# Patient Record
Sex: Female | Born: 1945 | Race: Black or African American | Hispanic: No | Marital: Single | Smoking: Former smoker
Health system: Southern US, Community
[De-identification: ages and names within clinical notes are randomized; demographics above are authoritative.]

## PROBLEM LIST (undated history)

## (undated) DIAGNOSIS — R233 Spontaneous ecchymoses: Secondary | ICD-10-CM

## (undated) DIAGNOSIS — K5090 Crohn's disease, unspecified, without complications: Secondary | ICD-10-CM

## (undated) DIAGNOSIS — IMO0002 Reserved for concepts with insufficient information to code with codable children: Secondary | ICD-10-CM

## (undated) DIAGNOSIS — M7989 Other specified soft tissue disorders: Secondary | ICD-10-CM

## (undated) DIAGNOSIS — R198 Other specified symptoms and signs involving the digestive system and abdomen: Secondary | ICD-10-CM

## (undated) DIAGNOSIS — K631 Perforation of intestine (nontraumatic): Secondary | ICD-10-CM

## (undated) DIAGNOSIS — R55 Syncope and collapse: Secondary | ICD-10-CM

## (undated) DIAGNOSIS — D649 Anemia, unspecified: Secondary | ICD-10-CM

## (undated) DIAGNOSIS — K921 Melena: Secondary | ICD-10-CM

## (undated) DIAGNOSIS — R531 Weakness: Secondary | ICD-10-CM

## (undated) DIAGNOSIS — I10 Essential (primary) hypertension: Secondary | ICD-10-CM

## (undated) DIAGNOSIS — R51 Headache: Secondary | ICD-10-CM

## (undated) DIAGNOSIS — R197 Diarrhea, unspecified: Secondary | ICD-10-CM

## (undated) DIAGNOSIS — K5900 Constipation, unspecified: Secondary | ICD-10-CM

## (undated) DIAGNOSIS — K625 Hemorrhage of anus and rectum: Secondary | ICD-10-CM

## (undated) DIAGNOSIS — R002 Palpitations: Secondary | ICD-10-CM

## (undated) DIAGNOSIS — R519 Headache, unspecified: Secondary | ICD-10-CM

## (undated) DIAGNOSIS — Z5189 Encounter for other specified aftercare: Secondary | ICD-10-CM

## (undated) DIAGNOSIS — M810 Age-related osteoporosis without current pathological fracture: Secondary | ICD-10-CM

## (undated) DIAGNOSIS — J029 Acute pharyngitis, unspecified: Secondary | ICD-10-CM

## (undated) DIAGNOSIS — R112 Nausea with vomiting, unspecified: Secondary | ICD-10-CM

## (undated) DIAGNOSIS — R21 Rash and other nonspecific skin eruption: Secondary | ICD-10-CM

## (undated) DIAGNOSIS — R062 Wheezing: Secondary | ICD-10-CM

## (undated) DIAGNOSIS — R509 Fever, unspecified: Secondary | ICD-10-CM

## (undated) DIAGNOSIS — R109 Unspecified abdominal pain: Secondary | ICD-10-CM

## (undated) DIAGNOSIS — R0981 Nasal congestion: Secondary | ICD-10-CM

## (undated) DIAGNOSIS — K632 Fistula of intestine: Secondary | ICD-10-CM

## (undated) DIAGNOSIS — R05 Cough: Secondary | ICD-10-CM

## (undated) DIAGNOSIS — R6883 Chills (without fever): Secondary | ICD-10-CM

## (undated) DIAGNOSIS — R599 Enlarged lymph nodes, unspecified: Secondary | ICD-10-CM

## (undated) DIAGNOSIS — E785 Hyperlipidemia, unspecified: Secondary | ICD-10-CM

## (undated) DIAGNOSIS — R238 Other skin changes: Secondary | ICD-10-CM

## (undated) DIAGNOSIS — R059 Cough, unspecified: Secondary | ICD-10-CM

## (undated) DIAGNOSIS — R140 Abdominal distension (gaseous): Secondary | ICD-10-CM

## (undated) HISTORY — DX: Encounter for other specified aftercare: Z51.89

## (undated) HISTORY — DX: Nausea with vomiting, unspecified: R11.2

## (undated) HISTORY — DX: Age-related osteoporosis without current pathological fracture: M81.0

## (undated) HISTORY — DX: Hemorrhage of anus and rectum: K62.5

## (undated) HISTORY — DX: Diarrhea, unspecified: R19.7

## (undated) HISTORY — DX: Cough, unspecified: R05.9

## (undated) HISTORY — DX: Nasal congestion: R09.81

## (undated) HISTORY — DX: Palpitations: R00.2

## (undated) HISTORY — DX: Abdominal distension (gaseous): R14.0

## (undated) HISTORY — DX: Melena: K92.1

## (undated) HISTORY — DX: Chills (without fever): R68.83

## (undated) HISTORY — DX: Syncope and collapse: R55

## (undated) HISTORY — DX: Enlarged lymph nodes, unspecified: R59.9

## (undated) HISTORY — DX: Other skin changes: R23.8

## (undated) HISTORY — DX: Anemia, unspecified: D64.9

## (undated) HISTORY — DX: Wheezing: R06.2

## (undated) HISTORY — DX: Headache, unspecified: R51.9

## (undated) HISTORY — DX: Other specified symptoms and signs involving the digestive system and abdomen: R19.8

## (undated) HISTORY — DX: Headache: R51

## (undated) HISTORY — DX: Spontaneous ecchymoses: R23.3

## (undated) HISTORY — DX: Fistula of intestine: K63.2

## (undated) HISTORY — DX: Rash and other nonspecific skin eruption: R21

## (undated) HISTORY — DX: Constipation, unspecified: K59.00

## (undated) HISTORY — DX: Weakness: R53.1

## (undated) HISTORY — DX: Fever, unspecified: R50.9

## (undated) HISTORY — DX: Unspecified abdominal pain: R10.9

## (undated) HISTORY — DX: Cough: R05

## (undated) HISTORY — PX: OTHER SURGICAL HISTORY: SHX169

## (undated) HISTORY — DX: Acute pharyngitis, unspecified: J02.9

---

## 1998-12-03 ENCOUNTER — Ambulatory Visit (HOSPITAL_COMMUNITY): Admission: RE | Admit: 1998-12-03 | Discharge: 1998-12-03 | Payer: Self-pay | Admitting: Family Medicine

## 2000-12-30 ENCOUNTER — Ambulatory Visit (HOSPITAL_COMMUNITY): Admission: RE | Admit: 2000-12-30 | Discharge: 2000-12-30 | Payer: Self-pay | Admitting: Emergency Medicine

## 2001-01-31 ENCOUNTER — Encounter: Admission: RE | Admit: 2001-01-31 | Discharge: 2001-01-31 | Payer: Self-pay | Admitting: Gastroenterology

## 2001-01-31 ENCOUNTER — Encounter: Payer: Self-pay | Admitting: Gastroenterology

## 2001-03-01 ENCOUNTER — Encounter: Admission: RE | Admit: 2001-03-01 | Discharge: 2001-03-01 | Payer: Self-pay | Admitting: Internal Medicine

## 2001-03-01 ENCOUNTER — Encounter: Payer: Self-pay | Admitting: Internal Medicine

## 2001-03-08 ENCOUNTER — Encounter: Payer: Self-pay | Admitting: Internal Medicine

## 2001-03-08 ENCOUNTER — Encounter: Admission: RE | Admit: 2001-03-08 | Discharge: 2001-03-08 | Payer: Self-pay | Admitting: Internal Medicine

## 2001-03-11 ENCOUNTER — Ambulatory Visit (HOSPITAL_COMMUNITY): Admission: RE | Admit: 2001-03-11 | Discharge: 2001-03-11 | Payer: Self-pay | Admitting: Gastroenterology

## 2001-03-11 ENCOUNTER — Encounter (INDEPENDENT_AMBULATORY_CARE_PROVIDER_SITE_OTHER): Payer: Self-pay | Admitting: *Deleted

## 2001-03-16 ENCOUNTER — Encounter: Admission: RE | Admit: 2001-03-16 | Discharge: 2001-05-03 | Payer: Self-pay | Admitting: Internal Medicine

## 2001-04-06 ENCOUNTER — Encounter: Payer: Self-pay | Admitting: Gastroenterology

## 2001-04-06 ENCOUNTER — Encounter: Admission: RE | Admit: 2001-04-06 | Discharge: 2001-04-06 | Payer: Self-pay | Admitting: Gastroenterology

## 2002-05-29 ENCOUNTER — Encounter: Payer: Self-pay | Admitting: Internal Medicine

## 2002-05-29 ENCOUNTER — Encounter: Admission: RE | Admit: 2002-05-29 | Discharge: 2002-05-29 | Payer: Self-pay | Admitting: Internal Medicine

## 2003-03-26 ENCOUNTER — Encounter: Admission: RE | Admit: 2003-03-26 | Discharge: 2003-03-26 | Payer: Self-pay | Admitting: Internal Medicine

## 2003-07-02 ENCOUNTER — Encounter: Admission: RE | Admit: 2003-07-02 | Discharge: 2003-07-02 | Payer: Self-pay | Admitting: Internal Medicine

## 2003-07-06 ENCOUNTER — Encounter: Admission: RE | Admit: 2003-07-06 | Discharge: 2003-07-06 | Payer: Self-pay | Admitting: Internal Medicine

## 2003-07-19 ENCOUNTER — Encounter: Admission: RE | Admit: 2003-07-19 | Discharge: 2003-07-19 | Payer: Self-pay | Admitting: Gastroenterology

## 2003-10-08 ENCOUNTER — Encounter: Admission: RE | Admit: 2003-10-08 | Discharge: 2003-10-08 | Payer: Self-pay | Admitting: Internal Medicine

## 2004-01-03 ENCOUNTER — Encounter (INDEPENDENT_AMBULATORY_CARE_PROVIDER_SITE_OTHER): Payer: Self-pay | Admitting: *Deleted

## 2004-01-03 ENCOUNTER — Ambulatory Visit (HOSPITAL_COMMUNITY): Admission: RE | Admit: 2004-01-03 | Discharge: 2004-01-03 | Payer: Self-pay | Admitting: *Deleted

## 2004-02-18 ENCOUNTER — Encounter: Admission: RE | Admit: 2004-02-18 | Discharge: 2004-02-18 | Payer: Self-pay | Admitting: Obstetrics and Gynecology

## 2004-03-17 ENCOUNTER — Encounter: Admission: RE | Admit: 2004-03-17 | Discharge: 2004-03-17 | Payer: Self-pay | Admitting: General Surgery

## 2005-01-07 ENCOUNTER — Encounter: Admission: RE | Admit: 2005-01-07 | Discharge: 2005-01-07 | Payer: Self-pay | Admitting: Internal Medicine

## 2005-01-19 ENCOUNTER — Encounter: Admission: RE | Admit: 2005-01-19 | Discharge: 2005-01-19 | Payer: Self-pay | Admitting: Internal Medicine

## 2005-09-16 ENCOUNTER — Encounter: Admission: RE | Admit: 2005-09-16 | Discharge: 2005-09-16 | Payer: Self-pay | Admitting: Gastroenterology

## 2005-09-28 ENCOUNTER — Ambulatory Visit: Payer: Self-pay | Admitting: Hematology & Oncology

## 2005-10-14 LAB — CBC & DIFF AND RETIC
Basophils Absolute: 0 10*3/uL (ref 0.0–0.1)
EOS%: 0.1 % (ref 0.0–7.0)
MCH: 21.5 pg — ABNORMAL LOW (ref 26.0–34.0)
MCV: 70.3 fL — ABNORMAL LOW (ref 81.0–101.0)
MONO%: 1.6 % (ref 0.0–13.0)
RBC: 4.71 10*6/uL (ref 3.70–5.32)
RDW: 17.7 % — ABNORMAL HIGH (ref 11.3–14.5)
RETIC #: 109.7 10*3/uL (ref 19.7–115.1)
Retic %: 2.3 % (ref 0.4–2.3)

## 2005-10-17 LAB — TRANSFERRIN RECEPTOR, SOLUABLE: Transferrin Receptor, Soluble: 7.9 mg/L — ABNORMAL HIGH (ref 1.9–4.4)

## 2005-11-23 ENCOUNTER — Ambulatory Visit: Payer: Self-pay | Admitting: Hematology & Oncology

## 2005-11-25 LAB — CBC & DIFF AND RETIC
BASO%: 0 % (ref 0.0–2.0)
EOS%: 0.2 % (ref 0.0–7.0)
Eosinophils Absolute: 0 10*3/uL (ref 0.0–0.5)
LYMPH%: 7.8 % — ABNORMAL LOW (ref 14.0–48.0)
MCH: 25.2 pg — ABNORMAL LOW (ref 26.0–34.0)
MCHC: 32.8 g/dL (ref 32.0–36.0)
MCV: 76.8 fL — ABNORMAL LOW (ref 81.0–101.0)
MONO%: 6.1 % (ref 0.0–13.0)
Platelets: 286 10*3/uL (ref 145–400)
RBC: 4.91 10*6/uL (ref 3.70–5.32)
RDW: 28 % — ABNORMAL HIGH (ref 11.3–14.5)
RETIC #: 70.7 10*3/uL (ref 19.7–115.1)
Retic %: 1.4 % (ref 0.4–2.3)

## 2005-11-28 LAB — TRANSFERRIN RECEPTOR, SOLUABLE: Transferrin Receptor, Soluble: 4.1 mg/L (ref 1.9–4.4)

## 2006-01-27 ENCOUNTER — Encounter: Admission: RE | Admit: 2006-01-27 | Discharge: 2006-01-27 | Payer: Self-pay | Admitting: Internal Medicine

## 2006-02-22 ENCOUNTER — Ambulatory Visit: Payer: Self-pay | Admitting: Hematology & Oncology

## 2006-03-17 LAB — CBC WITH DIFFERENTIAL/PLATELET
BASO%: 0.2 % (ref 0.0–2.0)
Basophils Absolute: 0 10*3/uL (ref 0.0–0.1)
EOS%: 0 % (ref 0.0–7.0)
Eosinophils Absolute: 0 10*3/uL (ref 0.0–0.5)
HCT: 35.4 % (ref 34.8–46.6)
HGB: 11.9 g/dL (ref 11.6–15.9)
LYMPH%: 7.8 % — ABNORMAL LOW (ref 14.0–48.0)
MCH: 28.2 pg (ref 26.0–34.0)
MCHC: 33.6 g/dL (ref 32.0–36.0)
MCV: 83.7 fL (ref 81.0–101.0)
MONO#: 0.5 10*3/uL (ref 0.1–0.9)
MONO%: 8.3 % (ref 0.0–13.0)
NEUT#: 4.8 10*3/uL (ref 1.5–6.5)
NEUT%: 83.7 % — ABNORMAL HIGH (ref 39.6–76.8)
Platelets: 370 10*3/uL (ref 145–400)
RBC: 4.23 10*6/uL (ref 3.70–5.32)
RDW: 14.5 % (ref 11.3–14.5)
WBC: 5.8 10*3/uL (ref 3.9–10.0)
lymph#: 0.5 10*3/uL — ABNORMAL LOW (ref 0.9–3.3)

## 2006-03-19 LAB — FERRITIN: Ferritin: 125 ng/mL (ref 10–291)

## 2006-05-07 ENCOUNTER — Ambulatory Visit: Payer: Self-pay | Admitting: Hematology & Oncology

## 2006-05-12 LAB — CBC & DIFF AND RETIC
Basophils Absolute: 0 10*3/uL (ref 0.0–0.1)
Eosinophils Absolute: 0.1 10*3/uL (ref 0.0–0.5)
HGB: 12.3 g/dL (ref 11.6–15.9)
IRF: 0.28 (ref 0.130–0.330)
MCV: 82.7 fL (ref 81.0–101.0)
MONO#: 0.5 10*3/uL (ref 0.1–0.9)
MONO%: 10.2 % (ref 0.0–13.0)
NEUT#: 3.6 10*3/uL (ref 1.5–6.5)
RDW: 15.6 % — ABNORMAL HIGH (ref 11.3–14.5)
RETIC #: 56.6 10*3/uL (ref 19.7–115.1)

## 2006-05-14 LAB — FERRITIN: Ferritin: 44 ng/mL (ref 10–291)

## 2006-09-10 ENCOUNTER — Ambulatory Visit: Payer: Self-pay | Admitting: Hematology & Oncology

## 2007-02-02 ENCOUNTER — Encounter: Admission: RE | Admit: 2007-02-02 | Discharge: 2007-02-02 | Payer: Self-pay | Admitting: Internal Medicine

## 2008-07-02 ENCOUNTER — Encounter: Admission: RE | Admit: 2008-07-02 | Discharge: 2008-07-02 | Payer: Self-pay | Admitting: Internal Medicine

## 2008-07-09 ENCOUNTER — Encounter: Admission: RE | Admit: 2008-07-09 | Discharge: 2008-07-09 | Payer: Self-pay | Admitting: Internal Medicine

## 2008-10-29 ENCOUNTER — Encounter: Admission: RE | Admit: 2008-10-29 | Discharge: 2008-10-29 | Payer: Self-pay | Admitting: Gastroenterology

## 2010-01-28 ENCOUNTER — Encounter: Admission: RE | Admit: 2010-01-28 | Discharge: 2010-01-28 | Payer: Self-pay | Admitting: Internal Medicine

## 2010-09-05 NOTE — Procedures (Signed)
[place]  Patient:    Michele David, Michele David Visit Number: [id number] MRN: [id number]          Service Type: END Location: ENDO Attending Physician:  Charna Elizabeth Dictated by:   Anselmo Rod, M.D. Proc. Date: 03/11/01 Admit Date:  03/11/2001 Discharge Date: 03/11/2001   CC:         Dr. Trixie Deis. Myna Hidalgo, M.D.   Procedure Report  DATE OF BIRTH:  10-Nov-1945  PROCEDURE PERFORMED:  Colonoscopy with biopsy.  ENDOSCOPIST:  Anselmo Rod, M.D.  INSTRUMENT USED:  Olympus video colonoscope.  INDICATIONS FOR PROCEDURE:  Rectal bleeding, history of Crohns disease, and anemia in a 65 year old African-American female.  Colonoscopy is being done to evaluate extensive disease.  The patient had a recent history of severe diarrhea with blood in stool.  Recent radiological studies showed involvement of the small bowel with Crohns disease as well.  PREPROCEDURE PREPARATION:  Informed consent was procured from the patient. The patient was fasted for eight hours prior to the procedure, and prepped with a bottle of magnesium citrate, and a gallon of NuLytely the night prior to the procedure.  PREPROCEDURE PHYSICAL:  VITAL SIGNS:  Stable.  NECK:  Supple.  CHEST:  Clear to auscultation, S1 and S2 regular.  ABDOMEN:  Soft with normal bowel sounds.  DESCRIPTION OF PROCEDURE:  The patient was placed in the left lateral decubitus position, and sedated with 50 mg of Demerol and 5 mg of Versed intravenously.  Once the patient was adequately sedated, maintained on low flow oxygen and continuous cardiac monitoring, the Olympus video colonoscope was advanced from the rectum to the cecum without difficulty.  The patient had patchy ulceration in the right colon.  There was also some sort of stricturing seen on the right side as well.  However, the scope was negotiated through this site without any problems.  Small internal hemorrhoids  were appreciated on retroflexion in the rectum.  The mucosa on the left colon and transverse colon appeared relatively healthy.  IMPRESSION: 1. Patchy ulceration in the right colon. 2. Healthy-appearing transverse colon, left colon. 3. Small internal hemorrhoids.  RECOMMENDATIONS: 1. Await pathology results. 2. Start mesalamine as before. 3. Outpatient follow up in the next 10 days. Dictated by:   Anselmo Rod, M.D. Attending Physician:  Charna Elizabeth DD:  03/11/01 TD:  03/14/01 Job: [id number]

## 2011-02-10 ENCOUNTER — Other Ambulatory Visit: Payer: Self-pay | Admitting: Internal Medicine

## 2011-02-10 DIAGNOSIS — Z1231 Encounter for screening mammogram for malignant neoplasm of breast: Secondary | ICD-10-CM

## 2011-02-13 ENCOUNTER — Ambulatory Visit
Admission: RE | Admit: 2011-02-13 | Discharge: 2011-02-13 | Disposition: A | Discharge status: Home / Self Care | Payer: Medicare Other | Source: Ambulatory Visit | Attending: Internal Medicine | Admitting: Internal Medicine

## 2011-02-13 DIAGNOSIS — Z1231 Encounter for screening mammogram for malignant neoplasm of breast: Secondary | ICD-10-CM

## 2011-06-05 ENCOUNTER — Ambulatory Visit
Admission: RE | Admit: 2011-06-05 | Discharge: 2011-06-05 | Disposition: A | Discharge status: Home / Self Care | Payer: Medicare Other | Source: Ambulatory Visit | Attending: Internal Medicine | Admitting: Internal Medicine

## 2011-06-05 ENCOUNTER — Other Ambulatory Visit: Payer: Self-pay | Admitting: Internal Medicine

## 2011-06-05 DIAGNOSIS — R0602 Shortness of breath: Secondary | ICD-10-CM

## 2011-11-12 ENCOUNTER — Other Ambulatory Visit (HOSPITAL_COMMUNITY): Payer: Self-pay | Admitting: Internal Medicine

## 2011-11-16 ENCOUNTER — Other Ambulatory Visit (HOSPITAL_COMMUNITY): Payer: Self-pay | Admitting: Internal Medicine

## 2011-11-16 DIAGNOSIS — R0602 Shortness of breath: Secondary | ICD-10-CM

## 2011-11-19 ENCOUNTER — Other Ambulatory Visit: Payer: Self-pay

## 2011-11-20 ENCOUNTER — Encounter (HOSPITAL_COMMUNITY): Payer: Medicare Other

## 2011-11-25 ENCOUNTER — Ambulatory Visit (HOSPITAL_COMMUNITY)
Admission: RE | Admit: 2011-11-25 | Discharge: 2011-11-25 | Disposition: A | Discharge status: Home / Self Care | Payer: Medicare Other | Source: Ambulatory Visit | Attending: Internal Medicine | Admitting: Internal Medicine

## 2011-11-25 DIAGNOSIS — R0602 Shortness of breath: Secondary | ICD-10-CM | POA: Insufficient documentation

## 2011-11-25 MED ORDER — ALBUTEROL SULFATE (5 MG/ML) 0.5% IN NEBU
2.5000 mg | INHALATION_SOLUTION | Freq: Once | RESPIRATORY_TRACT | Status: AC
  Administered 2011-11-25: 2.5 mg via RESPIRATORY_TRACT

## 2012-01-23 ENCOUNTER — Encounter (HOSPITAL_COMMUNITY): Admission: EM | Disposition: A | Discharge status: Skilled Nursing Facility | Payer: Self-pay | Source: Home / Self Care

## 2012-01-23 ENCOUNTER — Encounter (HOSPITAL_COMMUNITY): Payer: Self-pay | Admitting: Surgery

## 2012-01-23 ENCOUNTER — Ambulatory Visit (INDEPENDENT_AMBULATORY_CARE_PROVIDER_SITE_OTHER): Payer: Medicare Other | Admitting: Family Medicine

## 2012-01-23 ENCOUNTER — Encounter (HOSPITAL_COMMUNITY): Payer: Self-pay | Admitting: Anesthesiology

## 2012-01-23 ENCOUNTER — Inpatient Hospital Stay (HOSPITAL_COMMUNITY)
Admission: EM | Admit: 2012-01-23 | Discharge: 2012-02-03 | DRG: 329 | Disposition: A | Discharge status: Skilled Nursing Facility | Payer: Medicare Other | Attending: Surgery | Admitting: Surgery

## 2012-01-23 ENCOUNTER — Emergency Department (HOSPITAL_COMMUNITY): Payer: Medicare Other

## 2012-01-23 ENCOUNTER — Inpatient Hospital Stay (HOSPITAL_COMMUNITY): Payer: Medicare Other | Admitting: Anesthesiology

## 2012-01-23 VITALS — BP 165/80 | HR 94 | Temp 99.4°F | Resp 18 | Ht 62.0 in | Wt 159.9 lb

## 2012-01-23 DIAGNOSIS — K660 Peritoneal adhesions (postprocedural) (postinfection): Secondary | ICD-10-CM | POA: Diagnosis present

## 2012-01-23 DIAGNOSIS — K631 Perforation of intestine (nontraumatic): Secondary | ICD-10-CM | POA: Diagnosis present

## 2012-01-23 DIAGNOSIS — K5000 Crohn's disease of small intestine without complications: Principal | ICD-10-CM | POA: Diagnosis present

## 2012-01-23 DIAGNOSIS — R1011 Right upper quadrant pain: Secondary | ICD-10-CM

## 2012-01-23 DIAGNOSIS — R5381 Other malaise: Secondary | ICD-10-CM | POA: Diagnosis present

## 2012-01-23 DIAGNOSIS — E8779 Other fluid overload: Secondary | ICD-10-CM | POA: Diagnosis not present

## 2012-01-23 DIAGNOSIS — K50019 Crohn's disease of small intestine with unspecified complications: Secondary | ICD-10-CM | POA: Diagnosis present

## 2012-01-23 DIAGNOSIS — K560 Paralytic ileus: Secondary | ICD-10-CM | POA: Diagnosis not present

## 2012-01-23 DIAGNOSIS — I10 Essential (primary) hypertension: Secondary | ICD-10-CM | POA: Diagnosis present

## 2012-01-23 DIAGNOSIS — K5090 Crohn's disease, unspecified, without complications: Secondary | ICD-10-CM

## 2012-01-23 DIAGNOSIS — R198 Other specified symptoms and signs involving the digestive system and abdomen: Secondary | ICD-10-CM | POA: Diagnosis present

## 2012-01-23 DIAGNOSIS — R197 Diarrhea, unspecified: Secondary | ICD-10-CM | POA: Diagnosis not present

## 2012-01-23 DIAGNOSIS — R109 Unspecified abdominal pain: Secondary | ICD-10-CM

## 2012-01-23 DIAGNOSIS — E876 Hypokalemia: Secondary | ICD-10-CM | POA: Diagnosis not present

## 2012-01-23 DIAGNOSIS — K658 Other peritonitis: Secondary | ICD-10-CM | POA: Diagnosis present

## 2012-01-23 DIAGNOSIS — I890 Lymphedema, not elsewhere classified: Secondary | ICD-10-CM | POA: Diagnosis present

## 2012-01-23 HISTORY — DX: Other specified symptoms and signs involving the digestive system and abdomen: R19.8

## 2012-01-23 HISTORY — PX: LAPAROTOMY: SHX154

## 2012-01-23 LAB — CBC WITH DIFFERENTIAL/PLATELET
Basophils Relative: 0 % (ref 0–1)
Eosinophils Absolute: 0 10*3/uL (ref 0.0–0.7)
HCT: 37.7 % (ref 36.0–46.0)
Hemoglobin: 13.1 g/dL (ref 12.0–15.0)
Lymphs Abs: 0.2 10*3/uL — ABNORMAL LOW (ref 0.7–4.0)
MCH: 29.2 pg (ref 26.0–34.0)
MCHC: 34.7 g/dL (ref 30.0–36.0)
Monocytes Absolute: 0.5 10*3/uL (ref 0.1–1.0)
Monocytes Relative: 5 % (ref 3–12)
Neutro Abs: 8.8 10*3/uL — ABNORMAL HIGH (ref 1.7–7.7)
RBC: 4.49 MIL/uL (ref 3.87–5.11)

## 2012-01-23 LAB — POCT CBC
Granulocyte percent: 91.7 %G — AB (ref 37–80)
HCT, POC: 46.3 % (ref 37.7–47.9)
Hemoglobin: 14.8 g/dL (ref 12.2–16.2)
Lymph, poc: 0.5 — AB (ref 0.6–3.4)
MCH, POC: 28.9 pg (ref 27–31.2)
MCHC: 32 g/dL (ref 31.8–35.4)
MCV: 90.5 fL (ref 80–97)
MID (cbc): 0.5 (ref 0–0.9)
MPV: 9.3 fL (ref 0–99.8)
POC Granulocyte: 11.3 — AB (ref 2–6.9)
POC LYMPH PERCENT: 4.2 %L — AB (ref 10–50)
POC MID %: 4.1 %M (ref 0–12)
Platelet Count, POC: 257 10*3/uL (ref 142–424)
RBC: 5.12 M/uL (ref 4.04–5.48)
RDW, POC: 14.9 %
WBC: 12.3 10*3/uL — AB (ref 4.6–10.2)

## 2012-01-23 LAB — POCT UA - MICROSCOPIC ONLY
Casts, Ur, LPF, POC: NEGATIVE
Crystals, Ur, HPF, POC: NEGATIVE
Yeast, UA: NEGATIVE

## 2012-01-23 LAB — POCT URINALYSIS DIPSTICK
Glucose, UA: NEGATIVE
Ketones, UA: NEGATIVE
Leukocytes, UA: NEGATIVE
Nitrite, UA: NEGATIVE
Spec Grav, UA: >=1.03
Urobilinogen, UA: 0.2
pH, UA: 5

## 2012-01-23 LAB — COMPREHENSIVE METABOLIC PANEL
Albumin: 3.1 g/dL — ABNORMAL LOW (ref 3.5–5.2)
Alkaline Phosphatase: 85 U/L (ref 39–117)
BUN: 22 mg/dL (ref 6–23)
Chloride: 106 mEq/L (ref 96–112)
Creatinine, Ser: 0.81 mg/dL (ref 0.50–1.10)
GFR calc Af Amer: 85 mL/min — ABNORMAL LOW (ref >90)
Glucose, Bld: 157 mg/dL — ABNORMAL HIGH (ref 70–99)
Total Bilirubin: 0.6 mg/dL (ref 0.3–1.2)

## 2012-01-23 LAB — LIPASE, BLOOD: Lipase: 8 U/L — ABNORMAL LOW (ref 11–59)

## 2012-01-23 SURGERY — LAPAROTOMY, EXPLORATORY
Anesthesia: General | Site: Abdomen | Wound class: Dirty or Infected

## 2012-01-23 MED ORDER — ONDANSETRON HCL 4 MG/2ML IJ SOLN
4.0000 mg | Freq: Once | INTRAMUSCULAR | Status: AC
  Administered 2012-01-23: 4 mg via INTRAVENOUS
  Filled 2012-01-23: qty 2

## 2012-01-23 MED ORDER — SODIUM CHLORIDE 0.9 % IV BOLUS (SEPSIS)
500.0000 mL | Freq: Once | INTRAVENOUS | Status: AC
  Administered 2012-01-23: 500 mL via INTRAVENOUS

## 2012-01-23 MED ORDER — IOHEXOL 300 MG/ML  SOLN
100.0000 mL | Freq: Once | INTRAMUSCULAR | Status: AC | PRN
  Administered 2012-01-23: 100 mL via INTRAVENOUS

## 2012-01-23 MED ORDER — ONDANSETRON HCL 4 MG/2ML IJ SOLN
INTRAMUSCULAR | Status: DC | PRN
  Administered 2012-01-23: 4 mg via INTRAVENOUS

## 2012-01-23 MED ORDER — PROPOFOL 10 MG/ML IV BOLUS
INTRAVENOUS | Status: DC | PRN
  Administered 2012-01-23: 160 mg via INTRAVENOUS

## 2012-01-23 MED ORDER — NEOSTIGMINE METHYLSULFATE 1 MG/ML IJ SOLN
INTRAMUSCULAR | Status: DC | PRN
  Administered 2012-01-23: 4 mg via INTRAVENOUS

## 2012-01-23 MED ORDER — LIDOCAINE HCL (CARDIAC) 20 MG/ML IV SOLN
INTRAVENOUS | Status: DC | PRN
  Administered 2012-01-23: 80 mg via INTRAVENOUS

## 2012-01-23 MED ORDER — MIDAZOLAM HCL 5 MG/5ML IJ SOLN
INTRAMUSCULAR | Status: DC | PRN
  Administered 2012-01-23 (×2): 1 mg via INTRAVENOUS

## 2012-01-23 MED ORDER — ACETAMINOPHEN 10 MG/ML IV SOLN
INTRAVENOUS | Status: AC
  Filled 2012-01-23: qty 100

## 2012-01-23 MED ORDER — ACETAMINOPHEN 10 MG/ML IV SOLN
INTRAVENOUS | Status: DC | PRN
  Administered 2012-01-23: 1000 mg via INTRAVENOUS

## 2012-01-23 MED ORDER — SODIUM CHLORIDE 0.9 % IV BOLUS (SEPSIS)
1000.0000 mL | Freq: Once | INTRAVENOUS | Status: AC
  Administered 2012-01-23: 1000 mL via INTRAVENOUS

## 2012-01-23 MED ORDER — KCL IN DEXTROSE-NACL 20-5-0.45 MEQ/L-%-% IV SOLN
INTRAVENOUS | Status: DC
  Administered 2012-01-24: 02:00:00 via INTRAVENOUS
  Filled 2012-01-23: qty 1000

## 2012-01-23 MED ORDER — LACTATED RINGERS IV SOLN
INTRAVENOUS | Status: DC | PRN
  Administered 2012-01-23 (×3): via INTRAVENOUS

## 2012-01-23 MED ORDER — FENTANYL CITRATE 0.05 MG/ML IJ SOLN
INTRAMUSCULAR | Status: DC | PRN
  Administered 2012-01-23 (×4): 50 ug via INTRAVENOUS
  Administered 2012-01-23: 100 ug via INTRAVENOUS
  Administered 2012-01-24: 50 ug via INTRAVENOUS

## 2012-01-23 MED ORDER — ROCURONIUM BROMIDE 100 MG/10ML IV SOLN
INTRAVENOUS | Status: DC | PRN
  Administered 2012-01-23: 25 mg via INTRAVENOUS
  Administered 2012-01-23: 10 mg via INTRAVENOUS
  Administered 2012-01-23: 5 mg via INTRAVENOUS

## 2012-01-23 MED ORDER — GLYCOPYRROLATE 0.2 MG/ML IJ SOLN
INTRAMUSCULAR | Status: DC | PRN
  Administered 2012-01-23: 0.6 mg via INTRAVENOUS

## 2012-01-23 MED ORDER — SODIUM CHLORIDE 0.9 % IR SOLN
Status: DC | PRN
  Administered 2012-01-23: 4000 mL

## 2012-01-23 MED ORDER — SUCCINYLCHOLINE CHLORIDE 20 MG/ML IJ SOLN
INTRAMUSCULAR | Status: DC | PRN
  Administered 2012-01-23: 140 mg via INTRAVENOUS

## 2012-01-23 MED ORDER — HYDROMORPHONE HCL PF 1 MG/ML IJ SOLN
1.0000 mg | Freq: Once | INTRAMUSCULAR | Status: AC
  Administered 2012-01-23: 1 mg via INTRAVENOUS
  Filled 2012-01-23: qty 1

## 2012-01-23 MED ORDER — PIPERACILLIN-TAZOBACTAM 3.375 G IVPB
3.3750 g | Freq: Three times a day (TID) | INTRAVENOUS | Status: DC
Start: 2012-01-23 — End: 2012-02-03
  Administered 2012-01-24 – 2012-02-03 (×30): 3.375 g via INTRAVENOUS
  Filled 2012-01-23 (×36): qty 50

## 2012-01-23 MED ORDER — PIPERACILLIN-TAZOBACTAM 3.375 G IVPB
3.3750 g | INTRAVENOUS | Status: AC
Start: 2012-01-23 — End: 2012-01-23
  Administered 2012-01-23: 3.375 g via INTRAVENOUS
  Filled 2012-01-23: qty 50

## 2012-01-23 SURGICAL SUPPLY — 43 items
APPLICATOR COTTON TIP 6IN STRL (MISCELLANEOUS) IMPLANT
BANDAGE GAUZE ELAST BULKY 4 IN (GAUZE/BANDAGES/DRESSINGS) ×2 IMPLANT
BLADE EXTENDED COATED 6.5IN (ELECTRODE) ×2 IMPLANT
BLADE HEX COATED 2.75 (ELECTRODE) ×2 IMPLANT
CANISTER SUCTION 2500CC (MISCELLANEOUS) ×2 IMPLANT
CLOTH BEACON ORANGE TIMEOUT ST (SAFETY) ×2 IMPLANT
COVER MAYO STAND STRL (DRAPES) ×2 IMPLANT
DRAPE LAPAROSCOPIC ABDOMINAL (DRAPES) ×2 IMPLANT
DRAPE WARM FLUID 44X44 (DRAPE) ×2 IMPLANT
DRSG PAD ABDOMINAL 8X10 ST (GAUZE/BANDAGES/DRESSINGS) ×2 IMPLANT
ELECT REM PT RETURN 9FT ADLT (ELECTROSURGICAL) ×2
ELECTRODE REM PT RTRN 9FT ADLT (ELECTROSURGICAL) ×1 IMPLANT
GLOVE BIOGEL PI IND STRL 7.0 (GLOVE) ×1 IMPLANT
GLOVE BIOGEL PI INDICATOR 7.0 (GLOVE) ×1
GLOVE SURG ORTHO 8.0 STRL STRW (GLOVE) ×2 IMPLANT
GOWN STRL NON-REIN LRG LVL3 (GOWN DISPOSABLE) ×2 IMPLANT
GOWN STRL REIN XL XLG (GOWN DISPOSABLE) ×4 IMPLANT
KIT BASIN OR (CUSTOM PROCEDURE TRAY) ×2 IMPLANT
LIGASURE IMPACT 36 18CM CVD LR (INSTRUMENTS) ×2 IMPLANT
NS IRRIG 1000ML POUR BTL (IV SOLUTION) ×4 IMPLANT
PACK GENERAL/GYN (CUSTOM PROCEDURE TRAY) ×2 IMPLANT
RELOAD PROXIMATE 75MM BLUE (ENDOMECHANICALS) ×4 IMPLANT
SEPRAFILM PROCEDURAL PACK 3X5 (MISCELLANEOUS) ×2 IMPLANT
SPONGE GAUZE 4X4 12PLY (GAUZE/BANDAGES/DRESSINGS) ×2 IMPLANT
SPONGE LAP 18X18 X RAY DECT (DISPOSABLE) ×4 IMPLANT
STAPLER PROXIMATE 75MM BLUE (STAPLE) ×2 IMPLANT
STAPLER VISISTAT 35W (STAPLE) ×2 IMPLANT
SUCTION POOLE TIP (SUCTIONS) ×2 IMPLANT
SUT NOV 1 T60/GS (SUTURE) IMPLANT
SUT NOVA NAB DX-16 0-1 5-0 T12 (SUTURE) ×8 IMPLANT
SUT SILK 2 0 (SUTURE) ×1
SUT SILK 2 0 SH CR/8 (SUTURE) ×4 IMPLANT
SUT SILK 2-0 18XBRD TIE 12 (SUTURE) ×1 IMPLANT
SUT SILK 3 0 (SUTURE) ×1
SUT SILK 3 0 SH CR/8 (SUTURE) ×6 IMPLANT
SUT SILK 3-0 18XBRD TIE 12 (SUTURE) ×1 IMPLANT
SUT VICRYL 2 0 18  UND BR (SUTURE)
SUT VICRYL 2 0 18 UND BR (SUTURE) IMPLANT
TAPE CLOTH SURG 4X10 WHT LF (GAUZE/BANDAGES/DRESSINGS) ×2 IMPLANT
TOWEL OR 17X26 10 PK STRL BLUE (TOWEL DISPOSABLE) ×4 IMPLANT
TRAY FOLEY CATH 14FRSI W/METER (CATHETERS) ×2 IMPLANT
WATER STERILE IRR 1500ML POUR (IV SOLUTION) IMPLANT
YANKAUER SUCT BULB TIP NO VENT (SUCTIONS) ×2 IMPLANT

## 2012-01-23 NOTE — Anesthesia Preprocedure Evaluation (Addendum)
Anesthesia Evaluation  Patient identified by MRN, date of birth, ID band Patient awake    Reviewed: Allergy & Precautions, H&P , NPO status , Patient's Chart, lab work & pertinent test results  History of Anesthesia Complications Negative for: history of anesthetic complications  Airway Mallampati: II TM Distance: >3 FB Neck ROM: Full    Dental  (+) Edentulous Upper, Partial Lower, Poor Dentition and Dental Advisory Given   Pulmonary neg sleep apnea, neg COPDformer smoker,  breath sounds clear to auscultation  Pulmonary exam normal       Cardiovascular hypertension, Pt. on medications - CAD, - Past MI, - CHF and - DVT Rhythm:Regular Rate:Normal     Neuro/Psych negative neurological ROS  negative psych ROS   GI/Hepatic negative GI ROS, Neg liver ROS,   Endo/Other  negative endocrine ROSneg diabetes  Renal/GU negative Renal ROS     Musculoskeletal negative musculoskeletal ROS (+)   Abdominal   Peds  Hematology negative hematology ROS (+)   Anesthesia Other Findings   Reproductive/Obstetrics                         Anesthesia Physical Anesthesia Plan  ASA: III  Anesthesia Plan: General   Post-op Pain Management:    Induction: Intravenous and Rapid sequence  Airway Management Planned: Oral ETT  Additional Equipment:   Intra-op Plan:   Post-operative Plan: Extubation in OR  Informed Consent: I have reviewed the patients History and Physical, chart, labs and discussed the procedure including the risks, benefits and alternatives for the proposed anesthesia with the patient or authorized representative who has indicated his/her understanding and acceptance.   Dental advisory given  Plan Discussed with: CRNA  Anesthesia Plan Comments:         Anesthesia Quick Evaluation

## 2012-01-23 NOTE — Patient Instructions (Addendum)
66 yo woman  With  Crohn's  Disease  Who developed acute right sided abdominal pain  At 4 this morning.  Pain has worsened  F/H:  Sister and nephew also have crohn's disease, sister had kidney stones   Objective:  118/68 left arm sitting,  110/62 standing left arm.  Pale and shaky  Obviously acutely ill with shallow respirations Chest:  Clear Heart:  Tachycardic at about 100, no murmur Abdomen:  Guarding right side with very tender right upper quadrant, positive rebound. Results for orders placed in visit on 01/23/12  POCT URINALYSIS DIPSTICK      Component Value Range   Color, UA yellow     Clarity, UA clear     Glucose, UA neg     Bilirubin, UA small     Ketones, UA neg     Spec Grav, UA >=1.030     Blood, UA trace-lysed     pH, UA 5.0     Protein, UA trace     Urobilinogen, UA 0.2     Nitrite, UA neg     Leukocytes, UA Negative    POCT UA - MICROSCOPIC ONLY      Component Value Range   WBC, Ur, HPF, POC 0-4     RBC, urine, microscopic 2-4     Bacteria, U Microscopic trace     Mucus, UA trace     Epithelial cells, urine per micros 0-1     Crystals, Ur, HPF, POC neg     Casts, Ur, LPF, POC neg     Yeast, UA neg    POCT CBC      Component Value Range   WBC 12.3 (*) 4.6 - 10.2 K/uL   Lymph, poc 0.5 (*) 0.6 - 3.4   POC LYMPH PERCENT 4.2 (*) 10 - 50 %L   MID (cbc) 0.5  0 - 0.9   POC MID % 4.1  0 - 12 %M   POC Granulocyte 11.3 (*) 2 - 6.9   Granulocyte percent 91.7 (*) 37 - 80 %G   RBC 5.12  4.04 - 5.48 M/uL   Hemoglobin 14.8  12.2 - 16.2 g/dL   HCT, POC 46.3  37.7 - 47.9 %   MCV 90.5  80 - 97 fL   MCH, POC 28.9  27 - 31.2 pg   MCHC 32.0  31.8 - 35.4 g/dL   RDW, POC 14.9     Platelet Count, POC 257  142 - 424 K/uL   MPV 9.3  0 - 99.8 fL     Assessment:  Acute abdomen  Plan:  Transfer to hospital  

## 2012-01-23 NOTE — H&P (Signed)
Michele David is an 66 y.o. female.    General Surgery [facility] - Attending  Chief Complaint: abdominal pain, perforated viscus  HPI: patient is a 66 year old black female referred from urgent care to the emergency department for evaluation of acute onset of abdominal pain. Patient experienced onset of lower abdominal pain approximately 4 AM on the day of admission. Pain radiated to the right shoulder and to the back. She developed nausea. She presented to the emergency department. White blood cell count was normal at 9.5 but there was a significant left shift of 93%. CT scan of the abdomen and pelvis was obtained and shows findings consistent with active Crohn's disease with perforation of the distal small bowel. General surgery is consulted for management.  Past surgical history is notable for exploratory laparotomy and resection of distal small bowel and proximal colon in the 1980s. The patient also had bilateral tubal ligation.  Past medical history is notable for hypertension and right lower extremity lymphedema. Patient's gastroenterologist has not been involved in her care for at least 3 years.  No past medical history on file.  No past surgical history on file.  No family history on file. Social History:  reports that she has quit smoking. She has never used smokeless tobacco. She reports that she does not drink alcohol or use illicit drugs.  Allergies:  Allergies  Allergen Reactions  . Tomato Other (See Comments)    Reaction: caused bumps in her mouth     (Not in a hospital admission)  Results for orders placed during the hospital encounter of 01/23/12 (from the past 48 hour(s))  CBC WITH DIFFERENTIAL     Status: Abnormal   Collection Time   01/23/12  4:56 PM      Component Value Range Comment   WBC 9.5  4.0 - 10.5 K/uL    RBC 4.49  3.87 - 5.11 MIL/uL    Hemoglobin 13.1  12.0 - 15.0 g/dL    HCT 45.4  09.8 - 11.9 %    MCV 84.0  78.0 - 100.0  fL    MCH 29.2  26.0 - 34.0 pg    MCHC 34.7  30.0 - 36.0 g/dL    RDW 14.7  82.9 - 56.2 %    Platelets 180  150 - 400 K/uL    Neutrophils Relative 93 (*) 43 - 77 %    Neutro Abs 8.8 (*) 1.7 - 7.7 K/uL    Lymphocytes Relative 2 (*) 12 - 46 %    Lymphs Abs 0.2 (*) 0.7 - 4.0 K/uL    Monocytes Relative 5  3 - 12 %    Monocytes Absolute 0.5  0.1 - 1.0 K/uL    Eosinophils Relative 0  0 - 5 %    Eosinophils Absolute 0.0  0.0 - 0.7 K/uL    Basophils Relative 0  0 - 1 %    Basophils Absolute 0.0  0.0 - 0.1 K/uL   COMPREHENSIVE METABOLIC PANEL     Status: Abnormal   Collection Time   01/23/12  4:56 PM      Component Value Range Comment   Sodium 139  135 - 145 mEq/L    Potassium 3.4 (*) 3.5 - 5.1 mEq/L    Chloride 106  96 - 112 mEq/L    CO2 21  19 - 32 mEq/L    Glucose, Bld 157 (*) 70 - 99 mg/dL    BUN 22  6 - 23 mg/dL  Creatinine, Ser 0.81  0.50 - 1.10 mg/dL    Calcium 8.3 (*) 8.4 - 10.5 mg/dL    Total Protein 5.9 (*) 6.0 - 8.3 g/dL    Albumin 3.1 (*) 3.5 - 5.2 g/dL    AST 18  0 - 37 U/L    ALT 15  0 - 35 U/L    Alkaline Phosphatase 85  39 - 117 U/L    Total Bilirubin 0.6  0.3 - 1.2 mg/dL    GFR calc non Af Amer 73 (*) >90 mL/min    GFR calc Af Amer 85 (*) >90 mL/min   LIPASE, BLOOD     Status: Abnormal   Collection Time   01/23/12  4:56 PM      Component Value Range Comment   Lipase 8 (*) 11 - 59 U/L    Dg Chest 2 View  01/23/2012  *RADIOLOGY REPORT*  Clinical Data: Abdominal pain.  History of Crohn disease.  Right upper abdomen and right lower chest pain.  CHEST - 2 VIEW  Comparison: 06/05/2011  Findings: Film is made with shallow lung inflation.  Heart size is accentuated by the shallow lung volumes.  Beneath the right hemidiaphragm, there is lucency on both the frontal and lateral views.  Although this may represent bowel loops beneath the hemidiaphragm, free intraperitoneal there is entirely excluded.  There is a right pleural effusion.  No pulmonary edema.  IMPRESSION: Findings  are suspicious for free intraperitoneal air below the right hemidiaphragm.  Further evaluation with left lateral decubitus view of the abdomen or CT of the abdomen pelvis with contrast recommended.  Critical test results telephoned to Dr. Rosalia Hammers at the time of interpretation on date 01/23/2012 at time 5:24 p.m.   Original Report Authenticated By: Patterson Hammersmith, M.D.    Ct Abdomen Pelvis W Contrast  01/23/2012  *RADIOLOGY REPORT*  Clinical Data: Diffuse abdominal pain and weakness. Free intraperitoneal gas.  History of Crohn's disease.  CT ABDOMEN AND PELVIS WITH CONTRAST  Technique:  Multidetector CT imaging of the abdomen and pelvis was performed following the standard protocol during bolus administration of intravenous contrast.  Contrast: OMNIPAQUE IOHEXOL 300 MG/ML  SOLN  Comparison: 01/23/2012; 10/29/2008  Findings: Small right pleural effusion with passive atelectasis noted in the right lower lobe.  There is a moderate amount of scattered free intraperitoneal gas primarily in the upper abdomen.  The most likely site for the perforation is an inflamed loop of distal ileum shown on image 35 of series 4, with loculated gas fluid along the superior margin of the bowel in this location favoring localized perforation.  This inflamed loop of small bowel is just proximal to the ileocolic anastomoses, and inflammatory findings extend proximally in the ileum from this point as well.  On image 34 of series 5, there is a suggestion of a small fistulous connection between two adjacent inflamed loops of distal ileum.  Fatty deposition in the proximal half of the colon wall is typical for inflammatory bowel disease.  Mild perihepatic ascites noted, with the liver otherwise unremarkable.  No portal venous gas observed.  There is reflux of contrast from the stomach into the esophagus and a small hiatal hernia.  A shelf-like appearance in the gastric body is probably related to peristalsis.  Spleen unremarkable.   Fullness of adrenal glands noted without overt adrenal mass.  Pancreas unremarkable.  There is a 2 mm nonobstructive left mid kidney calculus.  Kidneys otherwise unremarkable.  Gallbladder unremarkable.  A small  amount of pelvic ascites is present.  Urinary bladder unremarkable.  IMPRESSION:  1. Moderate amount of abnormal free peritoneal gas and scattered ascites.  Suspected site of perforation is in the distal ileum. Distal ileal inflammation is most compatible with Crohn's disease. 2.  Possible fistula between to loops of distal ileum on image 34 of series 5.  3.  Small right pleural effusion. 4.  Nonobstructive left nephrolithiasis.   Original Report Authenticated By: Dellia Cloud, M.D.     Review of Systems  Constitutional: Positive for chills.  HENT: Negative.   Eyes: Negative.   Respiratory: Positive for shortness of breath.   Cardiovascular: Positive for leg swelling (chronic RLE lymphedema).  Gastrointestinal: Positive for nausea and abdominal pain.  Genitourinary: Negative.   Musculoskeletal: Positive for back pain.  Skin: Negative.   Neurological: Negative.   Endo/Heme/Allergies: Negative.   Psychiatric/Behavioral: Negative.     Blood pressure 139/50, pulse 76, temperature 98.6 F (37 C), temperature source Oral, resp. rate 20, SpO2 99.00%. Physical Exam  Constitutional: She is oriented to person, place, and time. She appears well-developed and well-nourished. She appears distressed.  HENT:  Head: Normocephalic and atraumatic.  Right Ear: External ear normal.  Mouth/Throat: Oropharynx is clear and moist.  Eyes: Conjunctivae normal and EOM are normal. Pupils are equal, round, and reactive to light. No scleral icterus.  Neck: Normal range of motion. Neck supple. No thyromegaly present.  Cardiovascular: Normal rate, regular rhythm and normal heart sounds.   Respiratory: Effort normal and breath sounds normal. No respiratory distress. She has no wheezes.  GI: She exhibits  distension. She exhibits no mass. There is tenderness. There is rebound and guarding.  Musculoskeletal: Normal range of motion.  Lymphadenopathy:    She has no cervical adenopathy.  Neurological: She is alert and oriented to person, place, and time.  Skin: Skin is warm and dry.  Psychiatric: She has a normal mood and affect. Her behavior is normal.     Assessment/Plan 1.  Crohn's disease with perforation and free intraperitoneal air  - likely involvement of distal small bowel and proximal colon  - will require laparotomy, bowel resection, possible ileostomy, open wound  - IV Zosyn and Vanco initiated by ER MD  - OR aware and preparing for procedure this evening 2.  HTN 3.  Chronic lymphedema right lower extremity - ? Etiology  The risks and benefits of the procedure have been discussed at length with the patient.  The patient understands the proposed procedure, potential alternative treatments, and the course of recovery to be expected.  All of the patient's questions have been answered at this time.  The patient wishes to proceed with surgery.   Velora Heckler, MD, [facility] Office: [phone]    Laura Radilla M 01/23/2012, 7:42 PM

## 2012-01-23 NOTE — ED Notes (Signed)
Surgeon at bedside.  

## 2012-01-23 NOTE — ED Notes (Signed)
Per EMS pt was at MD office for severe abdominal pain. HX chrones. Pt blood pressure very low and white blood cell count up. MD started IV and gave bolus. BP then up to normal. MD then called EMS to have her assessed.

## 2012-01-23 NOTE — Progress Notes (Signed)
66 yo woman  With  Crohn's  Disease  Who developed acute right sided abdominal pain  At 4 this morning.  Pain has worsened  F/H:  Sister and nephew also have crohn's disease, sister had kidney stones   Objective:  118/68 left arm sitting,  110/62 standing left arm.  Pale and shaky  Obviously acutely ill with shallow respirations Chest:  Clear Heart:  Tachycardic at about 100, no murmur Abdomen:  Guarding right side with very tender right upper quadrant, positive rebound. Results for orders placed in visit on 01/23/12  POCT URINALYSIS DIPSTICK      Component Value Range   Color, UA yellow     Clarity, UA clear     Glucose, UA neg     Bilirubin, UA small     Ketones, UA neg     Spec Grav, UA >=1.030     Blood, UA trace-lysed     pH, UA 5.0     Protein, UA trace     Urobilinogen, UA 0.2     Nitrite, UA neg     Leukocytes, UA Negative    POCT UA - MICROSCOPIC ONLY      Component Value Range   WBC, Ur, HPF, POC 0-4     RBC, urine, microscopic 2-4     Bacteria, U Microscopic trace     Mucus, UA trace     Epithelial cells, urine per micros 0-1     Crystals, Ur, HPF, POC neg     Casts, Ur, LPF, POC neg     Yeast, UA neg    POCT CBC      Component Value Range   WBC 12.3 (*) 4.6 - 10.2 K/uL   Lymph, poc 0.5 (*) 0.6 - 3.4   POC LYMPH PERCENT 4.2 (*) 10 - 50 %L   MID (cbc) 0.5  0 - 0.9   POC MID % 4.1  0 - 12 %M   POC Granulocyte 11.3 (*) 2 - 6.9   Granulocyte percent 91.7 (*) 37 - 80 %G   RBC 5.12  4.04 - 5.48 M/uL   Hemoglobin 14.8  12.2 - 16.2 g/dL   HCT, POC 40.9  81.1 - 47.9 %   MCV 90.5  80 - 97 fL   MCH, POC 28.9  27 - 31.2 pg   MCHC 32.0  31.8 - 35.4 g/dL   RDW, POC 91.4     Platelet Count, POC 257  142 - 424 K/uL   MPV 9.3  0 - 99.8 fL     Assessment:  Acute abdomen  Plan:  Transfer to hospital

## 2012-01-23 NOTE — ED Provider Notes (Signed)
History     CSN: [id number]  Arrival date & time 01/23/12  1538   First MD Initiated Contact with Patient 01/23/12 1619      Chief Complaint  Patient presents with  . Abdominal Pain    (Consider location/radiation/quality/duration/timing/severity/associated sxs/prior treatment) HPI  Patient with history of crohn's disease complaining of pain began this am cramping diffuse then right sided of abdomen radiating up to righ side of chest.  Cough today with some green sputum, some sob.  Denies history of copd, asthma, pe, or dvt.  Patient has had nausea, but no vomiting.  She has not eaten today secondary to anorexia and has only one sip of water. No urinary frequency, urgency, or dysuria.  Bowel movements normal for patient, no diarrhea or blood in stool.   No past medical history on file.  No past surgical history on file.  No family history on file.  History  Substance Use Topics  . Smoking status: Former Games developer  . Smokeless tobacco: Never Used  . Alcohol Use: No    OB History    Grav Para Term Preterm Abortions TAB SAB Ect Mult Living                  Review of Systems  Constitutional: Negative for fever, chills, activity change, appetite change and unexpected weight change.  HENT: Negative for sore throat, rhinorrhea, neck pain, neck stiffness and sinus pressure.   Eyes: Negative for visual disturbance.  Respiratory: Negative for cough and shortness of breath.   Cardiovascular: Negative for chest pain and leg swelling.  Gastrointestinal: Positive for nausea and abdominal pain. Negative for vomiting, diarrhea and blood in stool.  Genitourinary: Negative for dysuria, urgency, frequency, vaginal discharge and difficulty urinating.  Musculoskeletal: Negative for myalgias, arthralgias and gait problem.  Skin: Negative for color change and rash.  Neurological: Negative for weakness, light-headedness and headaches.  Hematological: Does not bruise/bleed easily.    Psychiatric/Behavioral: Negative for dysphoric mood.    Allergies  Review of patient's allergies indicates no known allergies.  Home Medications   Current Outpatient Rx  Name Route Sig Dispense Refill  . DENOSUMAB 60 MG/ML [place] SOLN Subcutaneous Inject 60 mg into the skin every 6 (six) months. Administer in upper arm, thigh, or abdomen      BP 139/50  Pulse 76  Temp 98.6 F (37 C) (Oral)  Resp 20  SpO2 99%  Physical Exam  Nursing note and vitals reviewed. Constitutional: She is oriented to person, place, and time. She appears well-developed and well-nourished.       obese  HENT:  Head: Normocephalic and atraumatic.  Eyes: Conjunctivae normal and EOM are normal.  Neck: Normal range of motion. Neck supple.  Cardiovascular: Normal rate, regular rhythm and normal heart sounds.   Pulmonary/Chest: Effort normal and breath sounds normal.  Abdominal: Soft. She exhibits distension. She exhibits no mass. Bowel sounds are decreased. There is tenderness. There is guarding.  Musculoskeletal: Normal range of motion.  Neurological: She is alert and oriented to person, place, and time.  Skin: Skin is warm and dry.  Psychiatric: She has a normal mood and affect. Her behavior is normal.    ED Course  Procedures (including critical care time)  Labs Reviewed - No data to display No results found.   No diagnosis found.   Results for orders placed during the hospital encounter of 01/23/12  CBC WITH DIFFERENTIAL      Component Value Range   WBC 9.5  4.0 -  10.5 K/uL   RBC 4.49  3.87 - 5.11 MIL/uL   Hemoglobin 13.1  12.0 - 15.0 g/dL   HCT 16.1  09.6 - 04.5 %   MCV 84.0  78.0 - 100.0 fL   MCH 29.2  26.0 - 34.0 pg   MCHC 34.7  30.0 - 36.0 g/dL   RDW 40.9  81.1 - 91.4 %   Platelets 180  150 - 400 K/uL   Neutrophils Relative 93 (*) 43 - 77 %   Neutro Abs 8.8 (*) 1.7 - 7.7 K/uL   Lymphocytes Relative 2 (*) 12 - 46 %   Lymphs Abs 0.2 (*) 0.7 - 4.0 K/uL   Monocytes Relative 5  3 - 12 %    Monocytes Absolute 0.5  0.1 - 1.0 K/uL   Eosinophils Relative 0  0 - 5 %   Eosinophils Absolute 0.0  0.0 - 0.7 K/uL   Basophils Relative 0  0 - 1 %   Basophils Absolute 0.0  0.0 - 0.1 K/uL  COMPREHENSIVE METABOLIC PANEL      Component Value Range   Sodium 139  135 - 145 mEq/L   Potassium 3.4 (*) 3.5 - 5.1 mEq/L   Chloride 106  96 - 112 mEq/L   CO2 21  19 - 32 mEq/L   Glucose, Bld 157 (*) 70 - 99 mg/dL   BUN 22  6 - 23 mg/dL   Creatinine, Ser 7.82  0.50 - 1.10 mg/dL   Calcium 8.3 (*) 8.4 - 10.5 mg/dL   Total Protein 5.9 (*) 6.0 - 8.3 g/dL   Albumin 3.1 (*) 3.5 - 5.2 g/dL   AST 18  0 - 37 U/L   ALT 15  0 - 35 U/L   Alkaline Phosphatase 85  39 - 117 U/L   Total Bilirubin 0.6  0.3 - 1.2 mg/dL   GFR calc non Af Amer 73 (*) >90 mL/min   GFR calc Af Amer 85 (*) >90 mL/min  LIPASE, BLOOD      Component Value Range   Lipase 8 (*) 11 - 59 U/L  URINALYSIS, ROUTINE W REFLEX MICROSCOPIC      Component Value Range   Color, Urine ORANGE (*) YELLOW   APPearance CLEAR  CLEAR   Specific Gravity, Urine >1.046 (*) 1.005 - 1.030   pH 5.5  5.0 - 8.0   Glucose, UA NEGATIVE  NEGATIVE mg/dL   Hgb urine dipstick NEGATIVE  NEGATIVE   Bilirubin Urine SMALL (*) NEGATIVE   Ketones, ur NEGATIVE  NEGATIVE mg/dL   Protein, ur 30 (*) NEGATIVE mg/dL   Urobilinogen, UA 1.0  0.0 - 1.0 mg/dL   Nitrite NEGATIVE  NEGATIVE   Leukocytes, UA TRACE (*) NEGATIVE  MRSA PCR SCREENING      Component Value Range   MRSA by PCR INVALID RESULTS, SPECIMEN SENT FOR CULTURE (*) NEGATIVE  URINE MICROSCOPIC-ADD ON      Component Value Range   Squamous Epithelial / LPF RARE  RARE   WBC, UA 0-3  <3 WBC/hpf  MRSA CULTURE      Component Value Range   Specimen Description NOSE     Special Requests NONE     Culture Culture reincubated for better growth     Report Status PENDING    CBC      Component Value Range   WBC 7.5  4.0 - 10.5 K/uL   RBC 4.10  3.87 - 5.11 MIL/uL   Hemoglobin 11.8 (*) 12.0 - 15.0 g/dL  HCT 34.8  (*) 36.0 - 46.0 %   MCV 84.9  78.0 - 100.0 fL   MCH 28.8  26.0 - 34.0 pg   MCHC 33.9  30.0 - 36.0 g/dL   RDW 96.0  45.4 - 09.8 %   Platelets 170  150 - 400 K/uL  BASIC METABOLIC PANEL      Component Value Range   Sodium 135  135 - 145 mEq/L   Potassium 4.0  3.5 - 5.1 mEq/L   Chloride 102  96 - 112 mEq/L   CO2 22  19 - 32 mEq/L   Glucose, Bld 120 (*) 70 - 99 mg/dL   BUN 23  6 - 23 mg/dL   Creatinine, Ser 1.19 (*) 0.50 - 1.10 mg/dL   Calcium 7.2 (*) 8.4 - 10.5 mg/dL   GFR calc non Af Amer 45 (*) >90 mL/min   GFR calc Af Amer 52 (*) >90 mL/min    MDM  Dr. Manson Passey called with report of possible free air under right diaphragm.  CT changed to iv only- not to wait for full dose of oral contrast.  Discussed with Dr. Gerrit Friends.  Zosyn ordered.  Patient remains hemodynamically stable.  Patient and family advised regarding need for surgery.       Hilario Quarry, MD 01/26/12 [phone]

## 2012-01-23 NOTE — ED Notes (Signed)
[id number]> Expected date:01/23/12<BR> Expected time: 3:34 PM<BR> Means of arrival:Ambulance<BR> Comments:<BR> abd pain

## 2012-01-24 LAB — URINALYSIS, ROUTINE W REFLEX MICROSCOPIC
Ketones, ur: NEGATIVE mg/dL
Nitrite: NEGATIVE
Protein, ur: 30 mg/dL — AB
Urobilinogen, UA: 1 mg/dL (ref 0.0–1.0)
pH: 5.5 (ref 5.0–8.0)

## 2012-01-24 LAB — URINE MICROSCOPIC-ADD ON

## 2012-01-24 MED ORDER — BISACODYL 10 MG RE SUPP: 10.0000 mg | Freq: Two times a day (BID) | RECTAL | Status: DC | PRN

## 2012-01-24 MED ORDER — SODIUM CHLORIDE 0.9 % IJ SOLN: 9.0000 mL | INTRAMUSCULAR | Status: DC | PRN

## 2012-01-24 MED ORDER — ONDANSETRON HCL 4 MG/2ML IJ SOLN: 4.0000 mg | Freq: Four times a day (QID) | INTRAMUSCULAR | Status: DC | PRN

## 2012-01-24 MED ORDER — DIPHENHYDRAMINE HCL 50 MG/ML IJ SOLN: 12.5000 mg | Freq: Four times a day (QID) | INTRAMUSCULAR | Status: DC | PRN

## 2012-01-24 MED ORDER — OXYCODONE HCL 5 MG/5ML PO SOLN
5.0000 mg | Freq: Once | ORAL | Status: DC | PRN
  Filled 2012-01-24: qty 5

## 2012-01-24 MED ORDER — BIOTENE DRY MOUTH MT LIQD
15.0000 mL | Freq: Two times a day (BID) | OROMUCOSAL | Status: DC
  Administered 2012-01-24 – 2012-01-29 (×10): 15 mL via OROMUCOSAL

## 2012-01-24 MED ORDER — LIP MEDEX EX OINT
1.0000 "application " | TOPICAL_OINTMENT | Freq: Two times a day (BID) | CUTANEOUS | Status: DC
  Administered 2012-01-25 – 2012-02-03 (×19): 1 via TOPICAL
  Filled 2012-01-24 (×4): qty 7

## 2012-01-24 MED ORDER — LACTATED RINGERS IV BOLUS (SEPSIS)
1000.0000 mL | Freq: Once | INTRAVENOUS | Status: AC
  Administered 2012-01-24: 1000 mL via INTRAVENOUS

## 2012-01-24 MED ORDER — LACTATED RINGERS IV BOLUS (SEPSIS): 1000.0000 mL | Freq: Three times a day (TID) | INTRAVENOUS | Status: AC | PRN

## 2012-01-24 MED ORDER — DIPHENHYDRAMINE HCL 12.5 MG/5ML PO ELIX: 12.5000 mg | ORAL_SOLUTION | Freq: Four times a day (QID) | ORAL | Status: DC | PRN

## 2012-01-24 MED ORDER — CHLORHEXIDINE GLUCONATE 0.12 % MT SOLN
15.0000 mL | Freq: Two times a day (BID) | OROMUCOSAL | Status: DC
  Administered 2012-01-24 – 2012-01-29 (×11): 15 mL via OROMUCOSAL
  Filled 2012-01-24 (×14): qty 15

## 2012-01-24 MED ORDER — MAGIC MOUTHWASH
15.0000 mL | Freq: Four times a day (QID) | ORAL | Status: DC | PRN
  Filled 2012-01-24: qty 15

## 2012-01-24 MED ORDER — MENTHOL 3 MG MT LOZG
1.0000 | LOZENGE | OROMUCOSAL | Status: DC | PRN
  Filled 2012-01-24: qty 9

## 2012-01-24 MED ORDER — ACETAMINOPHEN 10 MG/ML IV SOLN: 1000.0000 mg | Freq: Once | INTRAVENOUS | Status: DC | PRN

## 2012-01-24 MED ORDER — LACTATED RINGERS IV SOLN
INTRAVENOUS | Status: DC
  Administered 2012-01-24: 23:00:00 via INTRAVENOUS

## 2012-01-24 MED ORDER — HYDROMORPHONE 0.3 MG/ML IV SOLN
INTRAVENOUS | Status: DC
  Administered 2012-01-24: 20:00:00 via INTRAVENOUS
  Administered 2012-01-24: 3.75 mg via INTRAVENOUS
  Administered 2012-01-25: 0.3 mg via INTRAVENOUS
  Administered 2012-01-25: 0.39 mg via INTRAVENOUS
  Administered 2012-01-25: 1.99 mg via INTRAVENOUS
  Administered 2012-01-25: 1.39 mg via INTRAVENOUS
  Administered 2012-01-25: 0.4 mg via INTRAVENOUS
  Administered 2012-01-26 (×2): 0.3 mg via INTRAVENOUS
  Administered 2012-01-26 (×2): 0.9 mg via INTRAVENOUS
  Administered 2012-01-26: 09:00:00 via INTRAVENOUS
  Administered 2012-01-26: 0.9 mg via INTRAVENOUS
  Administered 2012-01-27: 0.6 mg via INTRAVENOUS
  Administered 2012-01-27: 1.2 mg via INTRAVENOUS
  Administered 2012-01-27: 0.3 mg via INTRAVENOUS
  Administered 2012-01-27: 0.9 mg via INTRAVENOUS
  Administered 2012-01-27: 1.5 mg via INTRAVENOUS
  Administered 2012-01-28: 0.3 mg via INTRAVENOUS
  Administered 2012-01-28 (×2): 1.2 mg via INTRAVENOUS
  Administered 2012-01-28: 0.9 mg via INTRAVENOUS
  Administered 2012-01-28: 0.9 mL via INTRAVENOUS
  Administered 2012-01-29: 0.9 mg via INTRAVENOUS
  Administered 2012-01-29: 1.2 mg via INTRAVENOUS
  Administered 2012-01-29: 0.6 mg via INTRAVENOUS
  Administered 2012-01-29: 12:00:00 via INTRAVENOUS
  Administered 2012-01-29: 0.3 mg via INTRAVENOUS
  Administered 2012-01-30: 11:00:00 via INTRAVENOUS
  Administered 2012-01-30: 0.6 mg via INTRAVENOUS
  Administered 2012-01-30: 1.2 mg via INTRAVENOUS
  Administered 2012-01-31: 0.3 mg via INTRAVENOUS
  Administered 2012-01-31: 10:00:00 via INTRAVENOUS
  Administered 2012-01-31: 1.2 mg via INTRAVENOUS
  Administered 2012-02-01: 0.6 mg via INTRAVENOUS
  Administered 2012-02-01: 1.2 mg via INTRAVENOUS
  Administered 2012-02-01: 0.6 mg via INTRAVENOUS
  Filled 2012-01-24 (×6): qty 25

## 2012-01-24 MED ORDER — ENOXAPARIN SODIUM 40 MG/0.4ML ~~LOC~~ SOLN
40.0000 mg | SUBCUTANEOUS | Status: DC
Start: 2012-01-24 — End: 2012-02-03
  Administered 2012-01-24 – 2012-02-02 (×9): 40 mg via SUBCUTANEOUS
  Filled 2012-01-24 (×11): qty 0.4

## 2012-01-24 MED ORDER — NALOXONE HCL 0.4 MG/ML IJ SOLN: 0.4000 mg | INTRAMUSCULAR | Status: DC | PRN

## 2012-01-24 MED ORDER — HYDROMORPHONE HCL PF 1 MG/ML IJ SOLN
0.2500 mg | INTRAMUSCULAR | Status: DC | PRN
  Administered 2012-01-24 (×2): 0.5 mg via INTRAVENOUS

## 2012-01-24 MED ORDER — PROMETHAZINE HCL 25 MG/ML IJ SOLN: 6.2500 mg | INTRAMUSCULAR | Status: DC | PRN

## 2012-01-24 MED ORDER — PHENOL 1.4 % MT LIQD
2.0000 | OROMUCOSAL | Status: DC | PRN
  Filled 2012-01-24: qty 177

## 2012-01-24 MED ORDER — OXYCODONE HCL 5 MG PO TABS: 5.0000 mg | ORAL_TABLET | Freq: Once | ORAL | Status: DC | PRN

## 2012-01-24 MED ORDER — MEPERIDINE HCL 50 MG/ML IJ SOLN: 6.2500 mg | INTRAMUSCULAR | Status: DC | PRN

## 2012-01-24 MED ORDER — HYDROMORPHONE HCL PF 1 MG/ML IJ SOLN
INTRAMUSCULAR | Status: AC
  Filled 2012-01-24: qty 1

## 2012-01-24 MED ORDER — HYDROMORPHONE 0.3 MG/ML IV SOLN
INTRAVENOUS | Status: DC
  Administered 2012-01-24: 02:00:00 via INTRAVENOUS

## 2012-01-24 MED ORDER — KCL IN DEXTROSE-NACL 20-5-0.45 MEQ/L-%-% IV SOLN
INTRAVENOUS | Status: DC
  Administered 2012-01-24 (×2): via INTRAVENOUS
  Filled 2012-01-24 (×2): qty 1000

## 2012-01-24 MED ORDER — HYDROMORPHONE 0.3 MG/ML IV SOLN
INTRAVENOUS | Status: AC
  Filled 2012-01-24: qty 25

## 2012-01-24 NOTE — Progress Notes (Signed)
Patient ID: Michele David, female   DOB: Apr 02, 1946, 66 y.o.   MRN: [id number]  General Surgery - [facility] - Progress Note  POD# 1  Subjective: Patient in stepdown unit.  Somnolent.  Pain control adequate.  Objective: Vital signs in last 24 hours: Temp:  [97.6 F (36.4 C)-99.4 F (37.4 C)] 97.7 F (36.5 C) (10/06 0400) Pulse Rate:  [76-94] 77  (10/06 0600) Resp:  [11-27] 18  (10/06 0600) BP: (106-165)/(45-80) 106/54 mmHg (10/06 0600) SpO2:  [96 %-100 %] 98 % (10/06 0600) Weight:  [159 lb 14.4 oz (72.53 kg)-166 lb 7.2 oz (75.5 kg)] 166 lb 7.2 oz (75.5 kg) (10/06 0145)    Intake/Output from previous day: 10/05 0701 - 10/06 0700 In: 4050 [I.V.:4050] Out: 940 [Urine:590; Emesis/NG output:50; Blood:300]  Exam: HEENT - clear, not icteric Neck - soft Chest - clear bilaterally Cor - RRR, no murmur Abd - soft, dressing intact, small betadine staining on gauze; quiet Ext - no significant edema Neuro - grossly intact, no focal deficits  Lab Results:   Basename 01/23/12 1656 01/23/12 1458  WBC 9.5 12.3*  HGB 13.1 14.8  HCT 37.7 46.3  PLT 180 --     Basename 01/23/12 1656  NA 139  K 3.4*  CL 106  CO2 21  GLUCOSE 157*  BUN 22  CREATININE 0.81  CALCIUM 8.3*    Studies/Results: Dg Chest 2 View  01/23/2012  *RADIOLOGY REPORT*  Clinical Data: Abdominal pain.  History of Crohn disease.  Right upper abdomen and right lower chest pain.  CHEST - 2 VIEW  Comparison: 06/05/2011  Findings: Film is made with shallow lung inflation.  Heart size is accentuated by the shallow lung volumes.  Beneath the right hemidiaphragm, there is lucency on both the frontal and lateral views.  Although this may represent bowel loops beneath the hemidiaphragm, free intraperitoneal there is entirely excluded.  There is a right pleural effusion.  No pulmonary edema.  IMPRESSION: Findings are suspicious for free intraperitoneal air below the right hemidiaphragm.  Further  evaluation with left lateral decubitus view of the abdomen or CT of the abdomen pelvis with contrast recommended.  Critical test results telephoned to Dr. Rosalia Hammers at the time of interpretation on date 01/23/2012 at time 5:24 p.m.   Original Report Authenticated By: Patterson Hammersmith, M.D.    Ct Abdomen Pelvis W Contrast  01/23/2012  *RADIOLOGY REPORT*  Clinical Data: Diffuse abdominal pain and weakness. Free intraperitoneal gas.  History of Crohn's disease.  CT ABDOMEN AND PELVIS WITH CONTRAST  Technique:  Multidetector CT imaging of the abdomen and pelvis was performed following the standard protocol during bolus administration of intravenous contrast.  Contrast: OMNIPAQUE IOHEXOL 300 MG/ML  SOLN  Comparison: 01/23/2012; 10/29/2008  Findings: Small right pleural effusion with passive atelectasis noted in the right lower lobe.  There is a moderate amount of scattered free intraperitoneal gas primarily in the upper abdomen.  The most likely site for the perforation is an inflamed loop of distal ileum shown on image 35 of series 4, with loculated gas fluid along the superior margin of the bowel in this location favoring localized perforation.  This inflamed loop of small bowel is just proximal to the ileocolic anastomoses, and inflammatory findings extend proximally in the ileum from this point as well.  On image 34 of series 5, there is a suggestion of a small fistulous connection between two adjacent inflamed loops of distal ileum.  Fatty deposition in the proximal  half of the colon wall is typical for inflammatory bowel disease.  Mild perihepatic ascites noted, with the liver otherwise unremarkable.  No portal venous gas observed.  There is reflux of contrast from the stomach into the esophagus and a small hiatal hernia.  A shelf-like appearance in the gastric body is probably related to peristalsis.  Spleen unremarkable.  Fullness of adrenal glands noted without overt adrenal mass.  Pancreas unremarkable.   There is a 2 mm nonobstructive left mid kidney calculus.  Kidneys otherwise unremarkable.  Gallbladder unremarkable.  A small amount of pelvic ascites is present.  Urinary bladder unremarkable.  IMPRESSION:  1. Moderate amount of abnormal free peritoneal gas and scattered ascites.  Suspected site of perforation is in the distal ileum. Distal ileal inflammation is most compatible with Crohn's disease. 2.  Possible fistula between to loops of distal ileum on image 34 of series 5.  3.  Small right pleural effusion. 4.  Nonobstructive left nephrolithiasis.   Original Report Authenticated By: Dellia Cloud, M.D.     Assessment / Plan: 1.  Status post ex lap with LOA, resection of ileocolonic anastomosis with perforation from Crohn's disease  - NPO, NG, IVF  - IV Zosyn  - leave wound packed today - begin wet to dry dressings tomorrow - may get VAC 1-2 days  - OOB  - begin Lovenox later today  Velora Heckler, MD, [facility] Office: [phone]  01/24/2012

## 2012-01-24 NOTE — Transfer of Care (Signed)
Immediate Anesthesia Transfer of Care Note  Patient: Michele David  Procedure(s) Performed: Procedure(s) (LRB) with comments: EXPLORATORY LAPAROTOMY (N/A) - lysis of adhesions resection ileocolonic anastamosos with perforation  Patient Location: PACU  Anesthesia Type: General  Level of Consciousness: awake, alert  and oriented  Airway & Oxygen Therapy: Patient Spontanous Breathing and Patient connected to face mask oxygen  Post-op Assessment: Report given to PACU RN and Post -op Vital signs reviewed and stable  Post vital signs: Reviewed and stable  Complications: No apparent anesthesia complications

## 2012-01-24 NOTE — Progress Notes (Signed)
ANTICOAGULATION CONSULT NOTE - Initial Consult  Pharmacy Consult for Lovenox Indication: VTE prophylaxis  Allergies  Allergen Reactions  . Tomato Other (See Comments)    Reaction: caused bumps in her mouth    Patient Measurements: Height: 5\' 2"  (157.5 cm) Weight: 166 lb 7.2 oz (75.5 kg) IBW/kg (Calculated) : 50.1  Heparin Dosing Weight:   Vital Signs: Temp: 97.7 F (36.5 C) (10/06 0400) Temp src: Oral (10/06 0400) BP: 106/54 mmHg (10/06 0600) Pulse Rate: 77  (10/06 0600)  Labs:  Basename 01/23/12 1656 01/23/12 1458  HGB 13.1 14.8  HCT 37.7 46.3  PLT 180 --  APTT -- --  LABPROT -- --  INR -- --  HEPARINUNFRC -- --  CREATININE 0.81 --  CKTOTAL -- --  CKMB -- --  TROPONINI -- --    Estimated Creatinine Clearance: 64.2 ml/min (by C-G formula based on Cr of 0.81).   Medical History: History reviewed. No pertinent past medical history.  Medications:  Scheduled:    . antiseptic oral rinse  15 mL Mouth Rinse q12n4p  . chlorhexidine  15 mL Mouth Rinse BID  . HYDROmorphone      .  HYDROmorphone (DILAUDID) injection  1 mg Intravenous Once  . HYDROmorphone PCA 0.3 mg/mL   Intravenous Q4H  . HYDROmorphone PCA 0.3 mg/mL      . ondansetron (ZOFRAN) IV  4 mg Intravenous Once  . piperacillin-tazobactam (ZOSYN)  IV  3.375 g Intravenous To ER  . piperacillin-tazobactam (ZOSYN)  IV  3.375 g Intravenous Q8H  . sodium chloride  1,000 mL Intravenous Once  . sodium chloride  500 mL Intravenous Once  . DISCONTD: HYDROmorphone PCA 0.3 mg/mL   Intravenous Q4H   Infusions:    . dextrose 5 % and 0.45 % NaCl with KCl 20 mEq/L 125 mL/hr at 01/24/12 0215  . lactated ringers    . DISCONTD: dextrose 5 % and 0.45 % NaCl with KCl 20 mEq/L 125 mL/hr at 01/24/12 0156   PRN: iohexol, DISCONTD: acetaminophen, DISCONTD: diphenhydrAMINE, DISCONTD: diphenhydrAMINE, DISCONTD: diphenhydrAMINE, DISCONTD: diphenhydrAMINE, DISCONTD:  HYDROmorphone (DILAUDID) injection, DISCONTD: meperidine  (DEMEROL) injection, DISCONTD: naloxone, DISCONTD: naloxone, DISCONTD: ondansetron (ZOFRAN) IV, DISCONTD: ondansetron (ZOFRAN) IV, DISCONTD: oxyCODONE, DISCONTD: oxyCODONE, DISCONTD: promethazine, DISCONTD: sodium chloride DISCONTD: sodium chloride, DISCONTD: sodium chloride irrigation  Assessment: 66 yo F with Hx of Crohn's disease. Patient is S/P Exploratory lap, with resection of ileocolonic anastomosis w/ perf from Crohn's; extensive LOA Goal of Therapy:  Heparin level 0.3-0.6 units/ml 4 hr Heparin level goal Monitor platelets by anticoagulation protocol: Yes   Plan:  Lovenox 40mg  sq q 24 hours.  This will begin at 2pm today as requested.  Loletta Specter 01/24/2012,8:03 AM

## 2012-01-24 NOTE — Progress Notes (Signed)
66 yo female admitted through ED with abdominal pain, has hx of Chrohn's disease. Had Small bowel resection per Dr Gerrit Friends for perforated bowel, states adeq relief from pain with full dose PCA. Ice chips offered, educated on incentive spirometer, will assist up to chair this pm. NGT to LIS.

## 2012-01-24 NOTE — Op Note (Signed)
NAMEMarland Kitchen  Michele, COZART NO.:  [id number]  MEDICAL RECORD NO.:  [id number]  LOCATION:  1239                         FACILITY:  [facility]  PHYSICIAN:  Velora Heckler, MD      DATE OF BIRTH:  08/19/1945  DATE OF PROCEDURE:  01/23/2012                               OPERATIVE REPORT   PREOPERATIVE DIAGNOSIS:  Crohn's disease with perforation, peritonitis.  POSTOPERATIVE DIAGNOSIS:  same  PROCEDURE: 1. Exploratory laparotomy. 2. Extensive lysis of adhesions (80 minutes). 3. Resection of ileocolonic anastomosis with perforation. 4. Ileocolonic anastomosis between mid ileum and proximal transverse     colon.  SURGEON:  Velora Heckler, MD, FACS  ASSISTANT:  Gaynelle Adu, MD, FACS  ANESTHESIA:  General.  ESTIMATED BLOOD LOSS:  200 mL.  PREPARATION:  ChloraPrep.  COMPLICATIONS:  None.  INDICATIONS:  The patient is a 66 year old black female, who presented to the emergency department with sudden onset of right-sided abdominal pain.  The patient was evaluated by the emergency room physician.  She underwent CT scan of the abdomen and pelvis, which showed areas of free air scattered throughout the abdomen, fluid around the liver, changes consistent with Crohn disease, and evidence of perforation in the distal small bowel or proximal colon.  General Surgery was consulted and the patient was prepared urgently for the operating room.  BODY OF REPORT:  Procedure was done in OR #1 at the [facility].  The patient was brought to the operating room, placed in supine position on the operating room table.  Following administration of general anesthesia, the patient was positioned and then prepped and draped in usual strict aseptic fashion.  After ascertaining that an adequate level of anesthesia had been obtained, a midline abdominal incision was made with a #10 blade.  Dissection was carried down to the fascia.  Previous suture material was extracted. The  fascia was incised and the peritoneal cavity was entered cautiously. There are diffuse dense adhesions of the omentum to the anterior abdominal wall.  This was gently taken down with sharp dissection. Midline incision was extended caudad.  There are adherent loops of small bowel to the anterior abdominal wall.  These were taken down with sharp dissection.  There appears to be a loop of bowel involved with Crohn disease with ulceration and focal perforation.  This was closed with a 2- 0 silk suture.  Incision was extended towards the pubis and towards the xiphoid process.  Extensive lysis of adhesions was then required.  This takes approximately 80 minutes of operating time in order to define the anatomy and to free the entire small bowel.  There was a redundant loop of sigmoid colon, which is involved in the inflammatory process in the right lower quadrant.  It is successfully separated without injury.  In the right mid and upper abdomen is a large inflammatory mass which was quite firm.  There was a foul odor.  The mass was slowly mobilized from the lateral peritoneal adhesions.  There were interloop adhesions. There appeared to be a perforation near the level of the previous ileocolonic anastomosis.  There was spillage of undigested food into the peritoneal cavity which was evacuated.  The distal ileum  and ascending colon were completely mobilized, taking care to avoid injury to the duodenum which was well visualized.  The hepatic flexure was taken down and the proximal transverse colon was mobilized.  A point in the transverse colon was selected and transected with a GIA stapler. LigaSure was used to divide the mesentery.  Mesenteric closure was secured with interrupted 2-0 silk sutures.  Dissection was carried along the mesentery to the terminal ileum.  Dissection was carried up the ileum to a point immediately proximate to the perforation.  Small bowel was then transected with a GIA  stapler and the specimen was passed off the field and submitted to Pathology for review.  Peritoneal cavity was copiously irrigated with warm saline which was evacuated.  Next, a side-to-side functionally end-to-end anastomosis was created between the distal ileum and the proximal transverse colon. This was performed with a GIA stapler and the enterotomy was closed with interrupted 3-0 silk sutures.  Mesenteric defect was closed with interrupted 2-0 silk sutures.  Omentum was placed across the anastomosis and secured with interrupted 3-0 silk sutures.  Abdomen again irrigated copiously with several liters of warm saline was evacuated.  All particulate matter was removed from the peritoneal cavity that can be identified.  Good hemostasis was noted throughout.  Viscera returned to the peritoneal cavity after running the small bowel from the ligament of Treitz to the anastomosis.  No other injury was identified.  Seprafilm was placed over the small bowel as there was little remaining omentum. Midline incision was closed with interrupted #1 Novafil simple sutures. Subcutaneous tissues were packed with Betadine-soaked Kerlix gauze, covered with 4 x 4 gauze, covered with ABD pads, and secured with Hypafix tape.  The patient was awakened from anesthesia and brought to the recovery room.  The patient tolerated the procedure well.   Velora Heckler, MD, [facility] Office: [phone]   TMG/MEDQ  D:  01/24/2012  T:  01/24/2012  Job:  [id number]  cc:   Ralene Ok, M.D. Fax: [phone]  AOZHYQ MVH QION, MD, [facility] Fax: [phone]

## 2012-01-24 NOTE — Brief Op Note (Signed)
01/23/2012 - 01/24/2012  12:18 AM  PATIENT:  Michele David  66 y.o. female  PRE-OPERATIVE DIAGNOSIS:  Crohn's disease with perforation of ileocolonic anastomosis  POST-OPERATIVE DIAGNOSIS:  same  PROCEDURE:  1.  Exploratory laparotomy  2. Extensive lysis of adhesions (80 min)  3. Resection of ileocolonic anastomosis with perforations  4. Ileocolonic anastomosis  SURGEON:  Velora Heckler, MD, FACS  ASSISTANT:  Gaynelle Adu, MD, FACS  ANESTHESIA:   general  EBL:  Total I/O In: 2700 [I.V.:2700] Out: 600 [Urine:300; Blood:300]  BLOOD ADMINISTERED:none  DRAINS: none   LOCAL MEDICATIONS USED:  NONE  SPECIMEN:  Excision  DISPOSITION OF SPECIMEN:  PATHOLOGY  COUNTS:  YES  TOURNIQUET:  * No tourniquets in log *  DICTATION: .Other Dictation: Dictation Number [id number]  PLAN OF CARE: Admit to inpatient   PATIENT DISPOSITION:  PACU - hemodynamically stable.   Delay start of Pharmacological VTE agent (>24hrs) due to surgical blood loss or risk of bleeding: yes  Velora Heckler, MD, [facility] Office: [phone]

## 2012-01-24 NOTE — Anesthesia Postprocedure Evaluation (Signed)
Anesthesia Post Note  Patient: Michele David  Procedure(s) Performed: Procedure(s) (LRB): EXPLORATORY LAPAROTOMY (N/A)  Anesthesia type: General  Patient location: PACU  Post pain: Pain level controlled  Post assessment: Post-op Vital signs reviewed  Last Vitals: BP 123/57  Pulse 76  Temp 37 C (Oral)  Resp 23  SpO2 100%  Post vital signs: Reviewed  Level of consciousness: sedated  Complications: No apparent anesthesia complications

## 2012-01-25 ENCOUNTER — Encounter (HOSPITAL_COMMUNITY): Payer: Self-pay | Admitting: Surgery

## 2012-01-25 LAB — CBC
HCT: 34.8 % — ABNORMAL LOW (ref 36.0–46.0)
MCHC: 33.9 g/dL (ref 30.0–36.0)
Platelets: 170 10*3/uL (ref 150–400)
RDW: 14.4 % (ref 11.5–15.5)

## 2012-01-25 LAB — BASIC METABOLIC PANEL
BUN: 23 mg/dL (ref 6–23)
Creatinine, Ser: 1.21 mg/dL — ABNORMAL HIGH (ref 0.50–1.10)
GFR calc Af Amer: 52 mL/min — ABNORMAL LOW (ref >90)
GFR calc non Af Amer: 45 mL/min — ABNORMAL LOW (ref >90)
Potassium: 4 mEq/L (ref 3.5–5.1)

## 2012-01-25 MED ORDER — FUROSEMIDE 10 MG/ML IJ SOLN
40.0000 mg | Freq: Once | INTRAMUSCULAR | Status: AC
  Administered 2012-01-25: 40 mg via INTRAVENOUS
  Filled 2012-01-25: qty 4

## 2012-01-25 MED ORDER — FUROSEMIDE NICU IV SYRINGE 10 MG/ML
40.0000 mg | Freq: Once | INTRAMUSCULAR | Status: DC
  Administered 2012-01-25: 40 mg via INTRAVENOUS

## 2012-01-25 MED ORDER — KCL IN DEXTROSE-NACL 20-5-0.45 MEQ/L-%-% IV SOLN
INTRAVENOUS | Status: DC
  Administered 2012-01-25 – 2012-02-01 (×5): via INTRAVENOUS
  Filled 2012-01-25 (×16): qty 1000

## 2012-01-25 NOTE — Care Management Note (Signed)
    Page 1 of 1   02/03/2012     1:46:03 PM   CARE MANAGEMENT NOTE 02/03/2012  Patient:  Michele David, Michele David   Account Number:  [id number]  Date Initiated:  01/25/2012  Documentation initiated by:  Lorenda Ishihara  Subjective/Objective Assessment:   66 yo female admitted s/p perf bowel, exploratory laparotomy with resection. PTA lived at home alone.     Action/Plan:   Anticipated DC Date:  02/02/2012   Anticipated DC Plan:  HOME W HOME HEALTH SERVICES      DC Planning Services  CM consult      Choice offered to / List presented to:             Status of service:  Completed, signed off Medicare Important Message given?   (If response is "NO", the following Medicare IM given date fields will be blank) Date Medicare IM given:   Date Additional Medicare IM given:    Discharge Disposition:  SKILLED NURSING FACILITY  Per UR Regulation:  Reviewed for med. necessity/level of care/duration of stay  If discussed at Long Length of Stay Meetings, dates discussed:    Comments:  02-01-12 Lorenda Ishihara RN CM [address] with patient and dtr at bedside. Discussed d/c plans, recommendations for RN for wound care and PT (pending evaluation). Patient is agreeable. Lives at home alone. Plans to have son come to stay with her initially at d/c, dtr also available as needed. Patient given list of [facility] agencies for choice. Will f/u later. Currently barrier to d/c is nausea, poor po intake, advancing diet as tolerated.  01-28-12 Lorenda Ishihara RN CM 1100 Post op ileus, awaiting return of bowel function.

## 2012-01-25 NOTE — Progress Notes (Signed)
Dr Corliss Skains notified of patient experiencing increased respiratory rate and patient's left lower lobe with wheezing and crackles noted .

## 2012-01-25 NOTE — Progress Notes (Signed)
General Surgery Note  LOS: 2 days  POD# 2 Room - 1239 PCP - Dr. Clydene Laming GI - Dr. Arty Baumgartner (last seen > 5 years ago)  Assessment/Plan: 1.  EXPLORATORY LAPAROTOMY with resection of ileocolonic anastomosis - T. Gerkin - 01/23/2012   Perforation  On Zosyn  NGT, cont NPO  Transfer to floor  2.  Open wound  Being packed daily.  3.  Crohn's disease of small intestine with perforation. 4.  DVT proph -   Lovenox 5.  Foley  To try this out  Subjective:  Sore, but seems to be doing okay Objective:   Filed Vitals:   01/25/12 0815  BP: 98/40  Pulse: 73  Temp:   Resp: 21     Intake/Output from previous day:  10/06 0701 - 10/07 0700 In: 4000 [I.V.:2875; IV Piggyback:1125] Out: 380 [Urine:180; Emesis/NG output:200]  Intake/Output this shift:  Total I/O In: 375 [I.V.:375] Out: -    Physical Exam:   General: WN older BF who is alert and oriented.    HEENT: Normal. Pupils equal.  Has NGT. Marland Kitchen   Lungs: Clear.  IS about 750 cc.   Abdomen: Quiet.   Wound: Midline wound open and packed.   Lab Results:    Basename 01/25/12 0335 01/23/12 1656  WBC 7.5 9.5  HGB 11.8* 13.1  HCT 34.8* 37.7  PLT 170 180    BMET   Basename 01/25/12 0335 01/23/12 1656  NA 135 139  K 4.0 3.4*  CL 102 106  CO2 22 21  GLUCOSE 120* 157*  BUN 23 22  CREATININE 1.21* 0.81  CALCIUM 7.2* 8.3*    PT/INR  No results found for this basename: LABPROT:2,INR:2 in the last 72 hours  ABG  No results found for this basename: PHART:2,PCO2:2,PO2:2,HCO3:2 in the last 72 hours   Studies/Results:  Dg Chest 2 View  01/23/2012  *RADIOLOGY REPORT*  Clinical Data: Abdominal pain.  History of Crohn disease.  Right upper abdomen and right lower chest pain.  CHEST - 2 VIEW  Comparison: 06/05/2011  Findings: Film is made with shallow lung inflation.  Heart size is accentuated by the shallow lung volumes.  Beneath the right hemidiaphragm, there is lucency on both the frontal and lateral views.  Although this may  represent bowel loops beneath the hemidiaphragm, free intraperitoneal there is entirely excluded.  There is a right pleural effusion.  No pulmonary edema.  IMPRESSION: Findings are suspicious for free intraperitoneal air below the right hemidiaphragm.  Further evaluation with left lateral decubitus view of the abdomen or CT of the abdomen pelvis with contrast recommended.  Critical test results telephoned to Dr. Rosalia Hammers at the time of interpretation on date 01/23/2012 at time 5:24 p.m.   Original Report Authenticated By: Patterson Hammersmith, M.D.    Ct Abdomen Pelvis W Contrast  01/23/2012  *RADIOLOGY REPORT*  Clinical Data: Diffuse abdominal pain and weakness. Free intraperitoneal gas.  History of Crohn's disease.  CT ABDOMEN AND PELVIS WITH CONTRAST  Technique:  Multidetector CT imaging of the abdomen and pelvis was performed following the standard protocol during bolus administration of intravenous contrast.  Contrast: OMNIPAQUE IOHEXOL 300 MG/ML  SOLN  Comparison: 01/23/2012; 10/29/2008  Findings: Small right pleural effusion with passive atelectasis noted in the right lower lobe.  There is a moderate amount of scattered free intraperitoneal gas primarily in the upper abdomen.  The most likely site for the perforation is an inflamed loop of distal ileum shown on image 35 of  series 4, with loculated gas fluid along the superior margin of the bowel in this location favoring localized perforation.  This inflamed loop of small bowel is just proximal to the ileocolic anastomoses, and inflammatory findings extend proximally in the ileum from this point as well.  On image 34 of series 5, there is a suggestion of a small fistulous connection between two adjacent inflamed loops of distal ileum.  Fatty deposition in the proximal half of the colon wall is typical for inflammatory bowel disease.  Mild perihepatic ascites noted, with the liver otherwise unremarkable.  No portal venous gas observed.  There is reflux of  contrast from the stomach into the esophagus and a small hiatal hernia.  A shelf-like appearance in the gastric body is probably related to peristalsis.  Spleen unremarkable.  Fullness of adrenal glands noted without overt adrenal mass.  Pancreas unremarkable.  There is a 2 mm nonobstructive left mid kidney calculus.  Kidneys otherwise unremarkable.  Gallbladder unremarkable.  A small amount of pelvic ascites is present.  Urinary bladder unremarkable.  IMPRESSION:  1. Moderate amount of abnormal free peritoneal gas and scattered ascites.  Suspected site of perforation is in the distal ileum. Distal ileal inflammation is most compatible with Crohn's disease. 2.  Possible fistula between to loops of distal ileum on image 34 of series 5.  3.  Small right pleural effusion. 4.  Nonobstructive left nephrolithiasis.   Original Report Authenticated By: Dellia Cloud, M.D.      Anti-infectives:   Anti-infectives     Start     Dose/Rate Route Frequency Ordered Stop   01/23/12 2200  piperacillin-tazobactam (ZOSYN) IVPB 3.375 g       3.375 g 12.5 mL/hr over 240 Minutes Intravenous 3 times per day 01/23/12 1959     01/23/12 1830  piperacillin-tazobactam (ZOSYN) IVPB 3.375 g       3.375 g 100 mL/hr over 30 Minutes Intravenous To Emergency Dept 01/23/12 1745 01/23/12 1911          Ovidio Kin, MD, FACS Pager: [phone],   [facility] Office: [phone] 01/25/2012

## 2012-01-25 NOTE — Progress Notes (Signed)
Called to get report. RN just got an admission. She will call back.

## 2012-01-25 NOTE — Progress Notes (Signed)
Attempted to call report to [facility] RN, Saprina.  RN to call back for report. dph

## 2012-01-26 ENCOUNTER — Encounter (HOSPITAL_COMMUNITY): Payer: Self-pay | Admitting: Anesthesiology

## 2012-01-26 ENCOUNTER — Inpatient Hospital Stay (HOSPITAL_COMMUNITY): Payer: Medicare Other

## 2012-01-26 DIAGNOSIS — E876 Hypokalemia: Secondary | ICD-10-CM

## 2012-01-26 LAB — BASIC METABOLIC PANEL
BUN: 19 mg/dL (ref 6–23)
Calcium: 7.6 mg/dL — ABNORMAL LOW (ref 8.4–10.5)
GFR calc Af Amer: 65 mL/min — ABNORMAL LOW (ref >90)
GFR calc non Af Amer: 56 mL/min — ABNORMAL LOW (ref >90)
Potassium: 3.1 mEq/L — ABNORMAL LOW (ref 3.5–5.1)
Sodium: 138 mEq/L (ref 135–145)

## 2012-01-26 LAB — CBC WITH DIFFERENTIAL/PLATELET
Basophils Relative: 0 % (ref 0–1)
Eosinophils Absolute: 0 10*3/uL (ref 0.0–0.7)
Hemoglobin: 11 g/dL — ABNORMAL LOW (ref 12.0–15.0)
Lymphocytes Relative: 3 % — ABNORMAL LOW (ref 12–46)
MCHC: 35 g/dL (ref 30.0–36.0)
Monocytes Absolute: 0.1 10*3/uL (ref 0.1–1.0)
Neutrophils Relative %: 96 % — ABNORMAL HIGH (ref 43–77)
Platelets: 206 10*3/uL (ref 150–400)
RBC: 3.82 MIL/uL — ABNORMAL LOW (ref 3.87–5.11)

## 2012-01-26 LAB — MRSA CULTURE

## 2012-01-26 MED ORDER — POTASSIUM CHLORIDE 10 MEQ/100ML IV SOLN
10.0000 meq | INTRAVENOUS | Status: AC
  Administered 2012-01-26 (×4): 10 meq via INTRAVENOUS
  Filled 2012-01-26 (×4): qty 100

## 2012-01-26 NOTE — Progress Notes (Addendum)
Patient ID: ZEE PFUHL, female   DOB: [birth year], 66 y.o.   MRN: [id number] 3 Days Post-Op  Subjective: Pt feels ok.  No flatus yet.  Breathing better this morning.  Objective: Vital signs in last 24 hours: Temp:  [98 F (36.7 C)-98.7 F (37.1 C)] 98 F (36.7 C) (10/08 0556) Pulse Rate:  [66-103] 98  (10/08 0556) Resp:  [20-26] 21  (10/08 0556) BP: (110-136)/(37-74) 136/74 mmHg (10/08 0556) SpO2:  [98 %-100 %] 100 % (10/08 0556) FiO2 (%):  [2 %-32 %] 32 % (10/08 0437) Last BM Date: 01/23/12  Intake/Output from previous day: 10/07 0701 - 10/08 0700 In: 1141.3 [I.V.:1141.3] Out: 1375 [Urine:925; Emesis/NG output:450] Intake/Output this shift:    PE: Abd: soft, -BS, ND, wound is clean and packed.  NGT with some bilious output. Ht: regular Lungs: CTAB  Lab Results:   Basename 01/26/12 0340 01/25/12 0335  WBC 8.9 7.5  HGB 11.0* 11.8*  HCT 31.4* 34.8*  PLT 206 170   BMET  Basename 01/26/12 0340 01/25/12 0335  NA 138 135  K 3.1* 4.0  CL 102 102  CO2 24 22  GLUCOSE 103* 120*  BUN 19 23  CREATININE 1.01 1.21*  CALCIUM 7.6* 7.2*   PT/INR No results found for this basename: LABPROT:2,INR:2 in the last 72 hours CMP     Component Value Date/Time   NA 138 01/26/2012 0340   K 3.1* 01/26/2012 0340   CL 102 01/26/2012 0340   CO2 24 01/26/2012 0340   GLUCOSE 103* 01/26/2012 0340   BUN 19 01/26/2012 0340   CREATININE 1.01 01/26/2012 0340   CALCIUM 7.6* 01/26/2012 0340   PROT 5.9* 01/23/2012 1656   ALBUMIN 3.1* 01/23/2012 1656   AST 18 01/23/2012 1656   ALT 15 01/23/2012 1656   ALKPHOS 85 01/23/2012 1656   BILITOT 0.6 01/23/2012 1656   GFRNONAA 56* 01/26/2012 0340   GFRAA 65* 01/26/2012 0340   Lipase     Component Value Date/Time   LIPASE 8* 01/23/2012 1656       Studies/Results: Dg Chest 2 View  01/26/2012  *RADIOLOGY REPORT*  Clinical Data: Crackles left lower lobe.  CHEST - 2 VIEW  Comparison: 01/23/2012  Findings: Bibasilar atelectasis or infiltrates, right  greater than left, increased since prior study.  Suspect small right effusion. Heart is mildly enlarged.  NG tube is in the stomach.  IMPRESSION: Increasing bibasilar atelectasis or infiltrates, right greater than left.  Small right effusion.   Original Report Authenticated By: Cyndie Chime, M.D.     Anti-infectives: Anti-infectives     Start     Dose/Rate Route Frequency Ordered Stop   01/23/12 2200  piperacillin-tazobactam (ZOSYN) IVPB 3.375 g       3.375 g 12.5 mL/hr over 240 Minutes Intravenous 3 times per day 01/23/12 1959     01/23/12 1830  piperacillin-tazobactam (ZOSYN) IVPB 3.375 g       3.375 g 100 mL/hr over 30 Minutes Intravenous To Emergency Dept 01/23/12 1745 01/23/12 1911           Assessment/Plan  1. S/p ex lap with SBR for crohn's disease  resection of ileocolonic anastomosis - T. Gerkin - 01/23/2012  2. Post op ileus  3. Hypokalemia 4. DVT proph -   Lovenox  Plan: 1. Cont NGT and await bowel function 2. Mobilize and pulm toilet 3. Improved after lasix last night 4. Replace K+  LOS: 3 days    OSBORNE,KELLY E 01/26/2012, 9:47 AM  Still with  NGT.  Not much bowel function.  Otherwise looks good.  Ovidio Kin, MD, [facility] Surgery Pager: [phone] Office phone:  [phone]  Pager: [phone]

## 2012-01-26 NOTE — Progress Notes (Signed)
IV Team nurse was called to restart IV access on patient.  IV restarted by the anesthesiology nurse. The head of the IV team nurses  recommended Picc line for patient.

## 2012-01-27 LAB — BASIC METABOLIC PANEL
Calcium: 7.3 mg/dL — ABNORMAL LOW (ref 8.4–10.5)
GFR calc Af Amer: 78 mL/min — ABNORMAL LOW (ref >90)
GFR calc non Af Amer: 67 mL/min — ABNORMAL LOW (ref >90)
Glucose, Bld: 105 mg/dL — ABNORMAL HIGH (ref 70–99)
Potassium: 2.7 mEq/L — CL (ref 3.5–5.1)
Sodium: 140 mEq/L (ref 135–145)

## 2012-01-27 MED ORDER — POTASSIUM CHLORIDE 10 MEQ/100ML IV SOLN
10.0000 meq | INTRAVENOUS | Status: AC
  Administered 2012-01-27 (×6): 10 meq via INTRAVENOUS
  Filled 2012-01-27 (×6): qty 100

## 2012-01-27 NOTE — Progress Notes (Signed)
Patient ID: Michele David, female   DOB: 08-03-1945, 66 y.o.   MRN: [id number] 4 Days Post-Op  Subjective: Pt still without flatus, no other complaints.  Objective: Vital signs in last 24 hours: Temp:  [97.8 F (36.6 C)-99.2 F (37.3 C)] 98.4 F (36.9 C) (10/09 1000) Pulse Rate:  [78-94] 81  (10/09 1000) Resp:  [16-24] 24  (10/09 1133) BP: (141-156)/(50-88) 147/69 mmHg (10/09 1000) SpO2:  [96 %-100 %] 100 % (10/09 1133) FiO2 (%):  [34 %-36 %] 34 % (10/09 0412) Last BM Date: 01/23/12  Intake/Output from previous day: 10/08 0701 - 10/09 0700 In: 1363.8 [I.V.:913.8; IV Piggyback:450] Out: 1800 [Urine:1150; Emesis/NG output:650] Intake/Output this shift: Total I/O In: 0  Out: 200 [Urine:200]  PE: Abd: soft, absent BS, wound is clean and packed.  NGT with bilious output  Lab Results:   Basename 01/26/12 0340 01/25/12 0335  WBC 8.9 7.5  HGB 11.0* 11.8*  HCT 31.4* 34.8*  PLT 206 170   BMET  Basename 01/27/12 0330 01/26/12 0340  NA 140 138  K 2.7* 3.1*  CL 103 102  CO2 22 24  GLUCOSE 105* 103*  BUN 17 19  CREATININE 0.87 1.01  CALCIUM 7.3* 7.6*   PT/INR No results found for this basename: LABPROT:2,INR:2 in the last 72 hours CMP     Component Value Date/Time   NA 140 01/27/2012 0330   K 2.7* 01/27/2012 0330   CL 103 01/27/2012 0330   CO2 22 01/27/2012 0330   GLUCOSE 105* 01/27/2012 0330   BUN 17 01/27/2012 0330   CREATININE 0.87 01/27/2012 0330   CALCIUM 7.3* 01/27/2012 0330   PROT 5.9* 01/23/2012 1656   ALBUMIN 3.1* 01/23/2012 1656   AST 18 01/23/2012 1656   ALT 15 01/23/2012 1656   ALKPHOS 85 01/23/2012 1656   BILITOT 0.6 01/23/2012 1656   GFRNONAA 67* 01/27/2012 0330   GFRAA 78* 01/27/2012 0330   Lipase     Component Value Date/Time   LIPASE 8* 01/23/2012 1656       Studies/Results: Dg Chest 2 View  01/26/2012  *RADIOLOGY REPORT*  Clinical Data: Crackles left lower lobe.  CHEST - 2 VIEW  Comparison: 01/23/2012  Findings: Bibasilar atelectasis or  infiltrates, right greater than left, increased since prior study.  Suspect small right effusion. Heart is mildly enlarged.  NG tube is in the stomach.  IMPRESSION: Increasing bibasilar atelectasis or infiltrates, right greater than left.  Small right effusion.   Original Report Authenticated By: Cyndie Chime, M.D.     Anti-infectives: Anti-infectives     Start     Dose/Rate Route Frequency Ordered Stop   01/23/12 2200  piperacillin-tazobactam (ZOSYN) IVPB 3.375 g       3.375 g 12.5 mL/hr over 240 Minutes Intravenous 3 times per day 01/23/12 1959     01/23/12 1830  piperacillin-tazobactam (ZOSYN) IVPB 3.375 g       3.375 g 100 mL/hr over 30 Minutes Intravenous To Emergency Dept 01/23/12 1745 01/23/12 1911           Assessment/Plan  1. S/p ex lap with SBR for crohn's disease  resection of ileocolonic anastomosis - T. Gerkin - 01/23/2012  2. DVT prophy - lovenox 3. Post op ileus 4.  Hypokalemia  K+ - 2.7 - 01/27/2012  Plan: 1. Cont NPO and NGT for bowel rest.  Await bowel function 2. Cont to mobilize and pulmo toilet 3. Replace K+, check BMET in am   LOS: 4 days  OSBORNE,KELLY E 01/27/2012, 11:40 AM Pager: [phone]  Needs to ambulate.  Has not been out of bed today.  Dressing has not been changed (it is 17:40).  Plan BID dressing change.  Otherwise looks okay.  Ovidio Kin, MD, [facility] Pager: [phone] Office phone:  [phone]

## 2012-01-28 LAB — BASIC METABOLIC PANEL
BUN: 17 mg/dL (ref 6–23)
Chloride: 105 mEq/L (ref 96–112)
GFR calc Af Amer: 83 mL/min — ABNORMAL LOW (ref >90)
GFR calc non Af Amer: 71 mL/min — ABNORMAL LOW (ref >90)
Potassium: 3.1 mEq/L — ABNORMAL LOW (ref 3.5–5.1)
Sodium: 141 mEq/L (ref 135–145)

## 2012-01-28 MED ORDER — POTASSIUM CHLORIDE 10 MEQ/100ML IV SOLN
10.0000 meq | INTRAVENOUS | Status: AC
  Administered 2012-01-28 (×3): 10 meq via INTRAVENOUS
  Filled 2012-01-28 (×4): qty 100

## 2012-01-28 NOTE — Progress Notes (Signed)
Patient ID: Michele David, female   DOB: 1946/04/20, 66 y.o.   MRN: [id number] 5 Days Post-Op  Subjective: Pt passed a small amount of flatus. Otherwise feels ok.  Objective: Vital signs in last 24 hours: Temp:  [97.6 F (36.4 C)-99.1 F (37.3 C)] 99.1 F (37.3 C) (10/10 1003) Pulse Rate:  [61-81] 64  (10/10 1003) Resp:  [16-37] 20  (10/10 1003) BP: (140-176)/(56-64) 170/60 mmHg (10/10 1003) SpO2:  [99 %-100 %] 100 % (10/10 1003) FiO2 (%):  [32 %-34 %] 34 % (10/10 0400) Last BM Date: 01/23/12  Intake/Output from previous day: 10/09 0701 - 10/10 0700 In: 1942.1 [I.V.:1272.1; NG/GT:270; IV Piggyback:400] Out: 1150 [Urine:1050; Emesis/NG output:100] Intake/Output this shift: Total I/O In: 0  Out: 200 [Urine:200]  PE: Abd: soft, faint BS, ND, appropriately tender, wound is clean.  NG with some dark bilious output.  Lab Results:   [facility] 01/26/12 0340  WBC 8.9  HGB 11.0*  HCT 31.4*  PLT 206   BMET  Basename 01/28/12 0340 01/27/12 0330  NA 141 140  K 3.1* 2.7*  CL 105 103  CO2 27 22  GLUCOSE 130* 105*  BUN 17 17  CREATININE 0.83 0.87  CALCIUM 7.3* 7.3*   PT/INR No results found for this basename: LABPROT:2,INR:2 in the last 72 hours CMP     Component Value Date/Time   NA 141 01/28/2012 0340   K 3.1* 01/28/2012 0340   CL 105 01/28/2012 0340   CO2 27 01/28/2012 0340   GLUCOSE 130* 01/28/2012 0340   BUN 17 01/28/2012 0340   CREATININE 0.83 01/28/2012 0340   CALCIUM 7.3* 01/28/2012 0340   PROT 5.9* 01/23/2012 1656   ALBUMIN 3.1* 01/23/2012 1656   AST 18 01/23/2012 1656   ALT 15 01/23/2012 1656   ALKPHOS 85 01/23/2012 1656   BILITOT 0.6 01/23/2012 1656   GFRNONAA 71* 01/28/2012 0340   GFRAA 83* 01/28/2012 0340   Lipase     Component Value Date/Time   LIPASE 8* 01/23/2012 1656   Studies/Results: No results found.  Anti-infectives: Anti-infectives     Start     Dose/Rate Route Frequency Ordered Stop   01/23/12 2200  piperacillin-tazobactam (ZOSYN)  IVPB 3.375 g       3.375 g 12.5 mL/hr over 240 Minutes Intravenous 3 times per day 01/23/12 1959     01/23/12 1830  piperacillin-tazobactam (ZOSYN) IVPB 3.375 g       3.375 g 100 mL/hr over 30 Minutes Intravenous To Emergency Dept 01/23/12 1745 01/23/12 1911         Assessment/Plan 1. S/p ex lap with SBR for Crohn's disease  T. Gerkin - 01/23/2012 1a.  Open wound   Being packed daily.   2. Post op ileus 3. Hypokalemia 4. DVT pro - Lovenox  Plan: 1. Will try clamping her NGT today.  Allow few small sips of water.  If nausea or emesis, return to suction. 2. Replace K+   LOS: 5 days    OSBORNE,KELLY E 01/28/2012, 10:07 AM Pager: [phone]  Has taken about 1 and 1/2 cups of liquids with NGT clamped. Pass a small amount of flatus.  Overall looks okay.  Ovidio Kin, MD, [facility] Surgery Pager: [phone] Office phone:  [phone]

## 2012-01-29 LAB — BASIC METABOLIC PANEL
CO2: 27 mEq/L (ref 19–32)
Calcium: 7.2 mg/dL — ABNORMAL LOW (ref 8.4–10.5)
Creatinine, Ser: 0.89 mg/dL (ref 0.50–1.10)
GFR calc Af Amer: 76 mL/min — ABNORMAL LOW (ref >90)
GFR calc non Af Amer: 66 mL/min — ABNORMAL LOW (ref >90)
Sodium: 140 mEq/L (ref 135–145)

## 2012-01-29 NOTE — Progress Notes (Signed)
INITIAL ADULT NUTRITION ASSESSMENT Date: 01/29/2012   Time: 1:27 PM Reason for Assessment: NPO/clear liquids x 6 days  INTERVENTION: Diet advancement per MD. Will monitor.   ASSESSMENT: Female 66 y.o.  Dx: Crohn's disease of small intestine with complication  Food/Nutrition Related Hx: Pt states PTA she was eating "too much", typically 2 meals/day. Pt reports her weight has been stable around 140-150 pounds for the past few years, however noted pt's weight on admission was 159 pounds, now 173 pounds, unclear if this was entered in error. Pt states she had been following the appropriate diet for her Crohn's disease in the past, but recently started eating what she wanted to including snacking on nuts throughout the day. Pt came in with acute right sided abdominal pain that started the morning of 01/23/12.  Pt found to have perforation of ileocolonic anastomosis. POD # 5 exploratory laparotomy with extensive lysis of adhesions with resection of ileocolonic anastomosis. Pt reports tolerating clear liquid diet today, ate 100% of jello and soup and then got full. Pt denies any nausea today. Pt states she has been passing gas, however no BM since surgery.   Hx:  History reviewed. No pertinent past medical history.  Related Meds:  Scheduled Meds:   . antiseptic oral rinse  15 mL Mouth Rinse q12n4p  . chlorhexidine  15 mL Mouth Rinse BID  . enoxaparin (LOVENOX) injection  40 mg Subcutaneous Q24H  . HYDROmorphone PCA 0.3 mg/mL   Intravenous Q4H  . lip balm  1 application Topical BID  . piperacillin-tazobactam (ZOSYN)  IV  3.375 g Intravenous Q8H  . potassium chloride  10 mEq Intravenous Q1 Hr x 4   Continuous Infusions:   . dextrose 5 % and 0.45 % NaCl with KCl 20 mEq/L 100 mL/hr at 01/29/12 0546   PRN Meds:.bisacodyl, magic mouthwash, menthol-cetylpyridinium, phenol  Ht: 5\' 2"  (157.5 cm)  Wt: 173 lb 1 oz (78.5 kg)  Ideal Wt: 110 lb % Ideal Wt: 157  Usual Wt: 140-150 lb % Usual Wt:  115-123  Wt Readings from Last 10 Encounters:  01/25/12 173 lb 1 oz (78.5 kg)  01/23/12 159 lb 14.4 oz (72.53 kg)  01/25/12 173 lb 1 oz (78.5 kg)     Body mass index is 31.65 kg/(m^2). Class I obesity   Labs:  CMP     Component Value Date/Time   NA 140 01/29/2012 0317   K 3.5 01/29/2012 0317   CL 106 01/29/2012 0317   CO2 27 01/29/2012 0317   GLUCOSE 143* 01/29/2012 0317   BUN 20 01/29/2012 0317   CREATININE 0.89 01/29/2012 0317   CALCIUM 7.2* 01/29/2012 0317   PROT 5.9* 01/23/2012 1656   ALBUMIN 3.1* 01/23/2012 1656   AST 18 01/23/2012 1656   ALT 15 01/23/2012 1656   ALKPHOS 85 01/23/2012 1656   BILITOT 0.6 01/23/2012 1656   GFRNONAA 66* 01/29/2012 0317   GFRAA 76* 01/29/2012 0317    Intake/Output Summary (Last 24 hours) at 01/29/12 1338 Last data filed at 01/29/12 1000  Gross per 24 hour  Intake 3126.66 ml  Output    951 ml  Net 2175.66 ml   Last BM - 10/5  Diet Order: Clear Liquid   IVF:    dextrose 5 % and 0.45 % NaCl with KCl 20 mEq/L Last Rate: 100 mL/hr at 01/29/12 0546    Estimated Nutritional Needs:   Kcal:1550-1700 Protein:70-80g Fluid:1.5-1.7L  NUTRITION DIAGNOSIS: -Inadequate oral intake (NI-2.1).  Status: Ongoing  RELATED TO: awaiting return of  bowel function   AS EVIDENCE BY: clear liquid diet  MONITORING/EVALUATION(Goals): Advance diet as tolerated to low fiber diet.   EDUCATION NEEDS: -Education needs addressed - used teach back method to educate pt on low fiber diet for Crohn's disease flare ups. Provided handout of this information.   Dietitian #: [phone]  DOCUMENTATION CODES Per approved criteria  -Obesity Unspecified    Marshall Cork 01/29/2012, 1:27 PM

## 2012-01-29 NOTE — Progress Notes (Signed)
Patient ID: Michele David, female   DOB: [birth year], 66 y.o.   MRN: [id number] 6 Days Post-Op  Subjective: Pt feels well.  No nausea with ngt clamped.  Still passing some flatus.  Objective: Vital signs in last 24 hours: Temp:  [97.6 F (36.4 C)-99.1 F (37.3 C)] 97.6 F (36.4 C) (10/11 0514) Pulse Rate:  [61-65] 65  (10/11 0514) Resp:  [19-33] 27  (10/11 0514) BP: (115-170)/(56-99) 134/74 mmHg (10/11 0514) SpO2:  [97 %-100 %] 100 % (10/11 0514) Last BM Date: 01/23/12  Intake/Output from previous day: 10/10 0701 - 10/11 0700 In: 2716.7 [I.V.:2416.7; IV Piggyback:300] Out: 1001 [Urine:851; Emesis/NG output:150] Intake/Output this shift:    PE: Abd: soft, mildly tender, few BS, ND, wound is clean and packed  Lab Results:  No results found for this basename: WBC:2,HGB:2,HCT:2,PLT:2 in the last 72 hours BMET  [facility] 01/29/12 0317 01/28/12 0340  NA 140 141  K 3.5 3.1*  CL 106 105  CO2 27 27  GLUCOSE 143* 130*  BUN 20 17  CREATININE 0.89 0.83  CALCIUM 7.2* 7.3*   PT/INR No results found for this basename: LABPROT:2,INR:2 in the last 72 hours CMP     Component Value Date/Time   NA 140 01/29/2012 0317   K 3.5 01/29/2012 0317   CL 106 01/29/2012 0317   CO2 27 01/29/2012 0317   GLUCOSE 143* 01/29/2012 0317   BUN 20 01/29/2012 0317   CREATININE 0.89 01/29/2012 0317   CALCIUM 7.2* 01/29/2012 0317   PROT 5.9* 01/23/2012 1656   ALBUMIN 3.1* 01/23/2012 1656   AST 18 01/23/2012 1656   ALT 15 01/23/2012 1656   ALKPHOS 85 01/23/2012 1656   BILITOT 0.6 01/23/2012 1656   GFRNONAA 66* 01/29/2012 0317   GFRAA 76* 01/29/2012 0317   Lipase     Component Value Date/Time   LIPASE 8* 01/23/2012 1656       Studies/Results: No results found.  Anti-infectives: Anti-infectives     Start     Dose/Rate Route Frequency Ordered Stop   01/23/12 2200  piperacillin-tazobactam (ZOSYN) IVPB 3.375 g       3.375 g 12.5 mL/hr over 240 Minutes Intravenous 3 times per day 01/23/12  1959     01/23/12 1830  piperacillin-tazobactam (ZOSYN) IVPB 3.375 g       3.375 g 100 mL/hr over 30 Minutes Intravenous To Emergency Dept 01/23/12 1745 01/23/12 1911           Assessment/Plan  1. S/p ex lap with SBR for crohn's disease  T. Gerkin - 01/24/2012 2. Post op ileus, resolving 3. dvt proph - Lovenox  Plan: 1. Dc NGT and start clear liquids 2. Mobilize and pulm toilet   LOS: 6 days    OSBORNE,KELLY E 01/29/2012, 8:51 AM Pager: [phone]  Seems to be getting better, just slowly.  A little full on the liquids.  Ovidio Kin, MD, [facility] Surgery Pager: [phone] Office phone:  [phone]

## 2012-01-30 NOTE — Progress Notes (Signed)
Patient ID: Michele David, female   DOB: Jan 02, 1946, 66 y.o.   MRN: [id number] [facility] Surgery Progress Note:   7 Days Post-Op  Subjective: Mental status is clear Objective: Vital signs in last 24 hours: Temp:  [98 F (36.7 C)-98.7 F (37.1 C)] 98 F (36.7 C) (10/12 0550) Pulse Rate:  [55-60] 55  (10/12 0550) Resp:  [15-24] 17  (10/12 1121) BP: (113-148)/(59-76) 113/70 mmHg (10/12 0550) SpO2:  [2 %-100 %] 100 % (10/12 1121)  Intake/Output from previous day: 10/11 0701 - 10/12 0700 In: 1783.3 [P.O.:360; I.V.:1223.3; IV Piggyback:200] Out: 575 [Urine:575] Intake/Output this shift: Total I/O In: 1840 [P.O.:240; I.V.:1600] Out: 200 [Urine:200]  Physical Exam: Work of breathing is normal but with nasal Oxygen.  No abdominal complaints.  Taking clear tray but appetite is decreased.  Lab Results:  Results for orders placed during the hospital encounter of 01/23/12 (from the past 48 hour(s))  BASIC METABOLIC PANEL     Status: Abnormal   Collection Time   01/29/12  3:17 AM      Component Value Range Comment   Sodium 140  135 - 145 mEq/L    Potassium 3.5  3.5 - 5.1 mEq/L    Chloride 106  96 - 112 mEq/L    CO2 27  19 - 32 mEq/L    Glucose, Bld 143 (*) 70 - 99 mg/dL    BUN 20  6 - 23 mg/dL    Creatinine, Ser 4.09  0.50 - 1.10 mg/dL    Calcium 7.2 (*) 8.4 - 10.5 mg/dL    GFR calc non Af Amer 66 (*) >90 mL/min    GFR calc Af Amer 76 (*) >90 mL/min     Radiology/Results: No results found.  Anti-infectives: Anti-infectives     Start     Dose/Rate Route Frequency Ordered Stop   01/23/12 2200  piperacillin-tazobactam (ZOSYN) IVPB 3.375 g       3.375 g 12.5 mL/hr over 240 Minutes Intravenous 3 times per day 01/23/12 1959     01/23/12 1830  piperacillin-tazobactam (ZOSYN) IVPB 3.375 g       3.375 g 100 mL/hr over 30 Minutes Intravenous To Emergency Dept 01/23/12 1745 01/23/12 1911          Assessment/Plan: Problem List: Patient Active Problem List    Diagnosis  . 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 Perforated viscus    Slowly progressing after repair of small bowel perforation.   7 Days Post-Op    LOS: 7 days   Matt B. Daphine Deutscher, MD, [facility] [phone] beeper [phone]  01/30/2012 12:01 PM

## 2012-01-30 NOTE — Progress Notes (Signed)
Pt tolerated dressing change. Wet to dry with packing. No odor noted  some yellowish drainage was noted on old dressing will monitor.

## 2012-01-31 ENCOUNTER — Inpatient Hospital Stay (HOSPITAL_COMMUNITY): Payer: Medicare Other

## 2012-01-31 MED ORDER — FUROSEMIDE 10 MG/ML IJ SOLN
20.0000 mg | Freq: Two times a day (BID) | INTRAMUSCULAR | Status: AC
  Administered 2012-01-31 – 2012-02-01 (×2): 20 mg via INTRAVENOUS
  Filled 2012-01-31 (×3): qty 2

## 2012-01-31 NOTE — Progress Notes (Signed)
8 Days Post-Op  Subjective: Passing gas but no BM.  Gets dyspneic sometimes when she walks.  Objective: Vital signs in last 24 hours: Temp:  [98 F (36.7 C)-98.9 F (37.2 C)] 98 F (36.7 C) (10/13 0522) Pulse Rate:  [59-60] 59  (10/13 0522) Resp:  [17-25] 25  (10/13 0737) BP: (133-135)/(53-73) 133/53 mmHg (10/13 0522) SpO2:  [94 %-100 %] 100 % (10/13 0737) Last BM Date: 01/23/12  Intake/Output from previous day: 10/12 0701 - 10/13 0700 In: 4130 [P.O.:480; I.V.:3600; IV Piggyback:50] Out: 1000 [Urine:1000] Intake/Output this shift: Total I/O In: 240 [P.O.:240] Out: -   PE: Lungs-decreased breath sounds in bases Abd-soft, open wound clean  Lab Results:  No results found for this basename: WBC:2,HGB:2,HCT:2,PLT:2 in the last 72 hours BMET  [name] 01/29/12 0317  NA 140  K 3.5  CL 106  CO2 27  GLUCOSE 143*  BUN 20  CREATININE 0.89  CALCIUM 7.2*   PT/INR No results found for this basename: LABPROT:2,INR:2 in the last 72 hours Comprehensive Metabolic Panel:    Component Value Date/Time   NA 140 01/29/2012 0317   K 3.5 01/29/2012 0317   CL 106 01/29/2012 0317   CO2 27 01/29/2012 0317   BUN 20 01/29/2012 0317   CREATININE 0.89 01/29/2012 0317   GLUCOSE 143* 01/29/2012 0317   CALCIUM 7.2* 01/29/2012 0317   AST 18 01/23/2012 1656   ALT 15 01/23/2012 1656   ALKPHOS 85 01/23/2012 1656   BILITOT 0.6 01/23/2012 1656   PROT 5.9* 01/23/2012 1656   ALBUMIN 3.1* 01/23/2012 1656     Studies/Results: No results found.  Anti-infectives: Anti-infectives     Start     Dose/Rate Route Frequency Ordered Stop   01/23/12 2200  piperacillin-tazobactam (ZOSYN) IVPB 3.375 g       3.375 g 12.5 mL/hr over 240 Minutes Intravenous 3 times per day 01/23/12 1959     01/23/12 1830  piperacillin-tazobactam (ZOSYN) IVPB 3.375 g       3.375 g 100 mL/hr over 30 Minutes Intravenous To Emergency Dept 01/23/12 1745 01/23/12 1911          Assessment Principal Problem:  *Crohn's  disease of small intestine  s/p exploratory lap and resection of ileocolonic anastomosis for perforation-post op ileus slowly resolving  Mild dyspnea-on appropriate VTE prophylaxis; positive fluid balance     LOS: 8 days   Plan: Full liquid diet.  Check CXR.   Michele David J 01/31/2012

## 2012-01-31 NOTE — Progress Notes (Signed)
Informed Dr. Abbey Chatters of CXR results

## 2012-02-01 LAB — BASIC METABOLIC PANEL
Calcium: 6.7 mg/dL — ABNORMAL LOW (ref 8.4–10.5)
GFR calc Af Amer: 76 mL/min — ABNORMAL LOW (ref >90)
GFR calc non Af Amer: 66 mL/min — ABNORMAL LOW (ref >90)
Glucose, Bld: 124 mg/dL — ABNORMAL HIGH (ref 70–99)
Potassium: 3.4 mEq/L — ABNORMAL LOW (ref 3.5–5.1)
Sodium: 137 mEq/L (ref 135–145)

## 2012-02-01 MED ORDER — OXYCODONE-ACETAMINOPHEN 5-325 MG PO TABS
1.0000 | ORAL_TABLET | ORAL | Status: DC | PRN
  Administered 2012-02-01: 2 via ORAL
  Administered 2012-02-02 (×2): 1 via ORAL
  Filled 2012-02-01 (×2): qty 1
  Filled 2012-02-01: qty 2
  Filled 2012-02-01: qty 1

## 2012-02-01 NOTE — Progress Notes (Signed)
Patient interviewed and examined, agree with PA note above.  Mariella Saa MD, FACS  02/01/2012 12:07 PM

## 2012-02-01 NOTE — Evaluation (Signed)
Physical Therapy Evaluation Patient Details Name: Michele David MRN: [id number] DOB: [birth year] Today's Date: 02/01/2012 Time: 4098-1191 PT Time Calculation (min): 27 min  PT Assessment / Plan / Recommendation Clinical Impression  Pt presents with Crohn's disease with recent perforation of bowel s/p laparotomy.  Tolerated OOB and ambulation to/from restroom then in hallway.  Initially used no AD, however noted pt to be very unsteady.  Transitioned to RW with increased stability, however pt continues to be a fall risk.  Pt will benefit from skilled PT in acute venue to address deficits.  Pt may benefit from CIR if OT needs present.  If not appropriate then would need 24/7 supervision/assist vs ST SNF for follow up.     PT Assessment  Patient needs continued PT services    Follow Up Recommendations  Home health PT;Supervision/Assistance - 24 hour;Post acute inpatient    Does the patient have the potential to tolerate intense rehabilitation   Yes, Recommend IP Rehab Screening  Barriers to Discharge Decreased caregiver support      Equipment Recommendations  Rolling walker with 5" wheels    Recommendations for Other Services OT consult   Frequency Min 3X/week    Precautions / Restrictions Precautions Precautions: Fall Restrictions Weight Bearing Restrictions: No   Pertinent Vitals/Pain 6/10      Mobility  Bed Mobility Bed Mobility: Supine to Sit;Sitting - Scoot to Edge of Bed;Sit to Supine Supine to Sit: 5: Supervision Sitting - Scoot to Edge of Bed: 5: Supervision Sit to Supine: 5: Supervision Details for Bed Mobility Assistance: Supervision and cues for safety/technique.  Transfers Transfers: Sit to Stand;Stand to Sit Sit to Stand: With upper extremity assist;From bed;4: Min assist Stand to Sit: With upper extremity assist;With armrests;To chair/3-in-1;4: Min assist Details for Transfer Assistance: Max cues for hand placement and safety.  Pt demos impulsivity  with getting up and sitting down.  Ambulation/Gait Ambulation/Gait Assistance: 4: Min assist Ambulation Distance (Feet): 60 Feet Assistive device: Rolling walker;None Ambulation/Gait Assistance Details: Initially ambulated to restroom without AD, however pt very unsteady, therefore transitioned to RW with ambulation in hallway and noted increased stability, however continues to be a fall risk.  Cues for maintaining position inside RW and keeping RW with her until at seat surface.  Gait Pattern: Step-through pattern;Decreased stride length;Trunk flexed Gait velocity: decreased    Shoulder Instructions     Exercises     PT Diagnosis: Difficulty walking;Generalized weakness;Acute pain  PT Problem List: Decreased strength;Decreased range of motion;Decreased activity tolerance;Decreased balance;Decreased mobility;Decreased coordination;Decreased knowledge of use of DME;Decreased safety awareness;Pain PT Treatment Interventions: DME instruction;Gait training;Stair training;Functional mobility training;Therapeutic activities;Therapeutic exercise;Balance training;Patient/family education   PT Goals Acute Rehab PT Goals PT Goal Formulation: With patient Time For Goal Achievement: 02/08/12 Potential to Achieve Goals: Good Pt will go Sit to Stand: with supervision PT Goal: Sit to Stand - Progress: Goal set today Pt will go Stand to Sit: with supervision PT Goal: Stand to Sit - Progress: Goal set today Pt will Ambulate: 51 - 150 feet;with supervision;with least restrictive assistive device PT Goal: Ambulate - Progress: Goal set today Pt will Go Up / Down Stairs: Flight;with min assist;with rail(s);with least restrictive assistive device PT Goal: Up/Down Stairs - Progress: Goal set today (if D/C's home)  Visit Information  Last PT Received On: 02/01/12 Assistance Needed: +1    Subjective Data  Subjective: Give me a little time and then I'll walk.  Patient Stated Goal: to get back home.      Prior  Functioning  Home Living Lives With: Alone Available Help at Discharge: Family;Personal care attendant;Available PRN/intermittently Type of Home: House Home Access: Stairs to enter Entrance Stairs-Number of Steps: 1 Home Layout: Two level;Full bath on main level;Other (Comment) (bedroom upstairs) Alternate Level Stairs-Number of Steps: flight Alternate Level Stairs-Rails: Right Bathroom Shower/Tub: Health visitor: Standard Home Adaptive Equipment: None Prior Function Level of Independence: Independent Able to Take Stairs?: Yes Driving: Yes Vocation: Retired Musician: No difficulties    Cognition  Overall Cognitive Status: Appears within functional limits for tasks assessed/performed Arousal/Alertness: Awake/alert Orientation Level: Appears intact for tasks assessed Behavior During Session: [facility] for tasks performed    Extremity/Trunk Assessment Right Lower Extremity Assessment RLE ROM/Strength/Tone: Deficits RLE ROM/Strength/Tone Deficits: Pt with chronic R LE lymphadema, however is [facility] per functional assessment.  RLE Sensation: WFL - Light Touch Left Lower Extremity Assessment LLE ROM/Strength/Tone: Deficits LLE ROM/Strength/Tone Deficits: Edema noted (R>L), WFL per functional assessment.  LLE Sensation: WFL - Light Touch   Balance    End of Session PT - End of Session Activity Tolerance: Patient limited by pain;Patient limited by fatigue Patient left: in chair;with call bell/phone within reach;with family/visitor present Nurse Communication: Mobility status  GP     Page, Meribeth Mattes 02/01/2012, 5:52 PM

## 2012-02-01 NOTE — Progress Notes (Signed)
Rehab Admissions Coordinator Note:  Patient was screened by Clois Dupes for appropriateness for an Inpatient Acute Rehab Consult. Noted PT recommendation for CIR if OT needs assessed. OT eval pending. Pt's insurance , [employer], will not approve inpt rehab for this diagnosis   At this time, we are recommending home with 24/7 assist of family vs SNF rehab when medically ready.   Clois Dupes, RN 02/01/2012, 6:08 PM  I can be reached at [phone].

## 2012-02-01 NOTE — Progress Notes (Signed)
Patient ID: Michele David, female   DOB: [birth year], 66 y.o.   MRN: [id number] 9 Days Post-Op  Subjective: Pt feels better after lasix.  Tolerating full liquids.  No nausea  Objective: Vital signs in last 24 hours: Temp:  [98 F (36.7 C)-99.1 F (37.3 C)] 98 F (36.7 C) (10/14 0503) Pulse Rate:  [59-86] 59  (10/14 0503) Resp:  [8-23] 8  (10/14 0503) BP: (125-148)/(68-72) 125/72 mmHg (10/14 0503) SpO2:  [95 %-100 %] 100 % (10/14 0503) Last BM Date: 01/31/12  Intake/Output from previous day: 10/13 0701 - 10/14 0700 In: 1621 [P.O.:480; I.V.:941; IV Piggyback:200] Out: -  Intake/Output this shift: Total I/O In: -  Out: 125 [Urine:125]  PE: Abd: soft, wound is clean and packed, +BS, ND  Lab Results:  No results found for this basename: WBC:2,HGB:2,HCT:2,PLT:2 in the last 72 hours BMET  [facility] 02/01/12 0343  NA 137  K 3.4*  CL 104  CO2 26  GLUCOSE 124*  BUN 14  CREATININE 0.89  CALCIUM 6.7*   PT/INR No results found for this basename: LABPROT:2,INR:2 in the last 72 hours CMP     Component Value Date/Time   NA 137 02/01/2012 0343   K 3.4* 02/01/2012 0343   CL 104 02/01/2012 0343   CO2 26 02/01/2012 0343   GLUCOSE 124* 02/01/2012 0343   BUN 14 02/01/2012 0343   CREATININE 0.89 02/01/2012 0343   CALCIUM 6.7* 02/01/2012 0343   PROT 5.9* 01/23/2012 1656   ALBUMIN 3.1* 01/23/2012 1656   AST 18 01/23/2012 1656   ALT 15 01/23/2012 1656   ALKPHOS 85 01/23/2012 1656   BILITOT 0.6 01/23/2012 1656   GFRNONAA 66* 02/01/2012 0343   GFRAA 76* 02/01/2012 0343   Lipase     Component Value Date/Time   LIPASE 8* 01/23/2012 1656       Studies/Results: Dg Chest 2 View  01/31/2012  *RADIOLOGY REPORT*  Clinical Data: Short of breath.  Weakness.  CHEST - 2 VIEW  Comparison: 01/26/2012.  Findings: Interval removal of nasogastric tube.  Colonic air fluid levels are present in the upper abdomen.  Elevation of the right hemidiaphragm.  Persistent right basilar atelectasis  or infiltrate. Small left pleural effusion with blunting of the left costophrenic angle.  Retrocardiac density most compatible with atelectasis and effusion in combination.  Mediastinal contours unchanged.  IMPRESSION:  1.  Interval removal of nasogastric tube. 2.  Bilateral pleural effusions, greater on the right than left with basilar collapse / consolidation.   Original Report Authenticated By: Andreas Newport, M.D.     Anti-infectives: Anti-infectives     Start     Dose/Rate Route Frequency Ordered Stop   01/23/12 2200  piperacillin-tazobactam (ZOSYN) IVPB 3.375 g       3.375 g 12.5 mL/hr over 240 Minutes Intravenous 3 times per day 01/23/12 1959     01/23/12 1830  piperacillin-tazobactam (ZOSYN) IVPB 3.375 g       3.375 g 100 mL/hr over 30 Minutes Intravenous To Emergency Dept 01/23/12 1745 01/23/12 1911           Assessment/Plan  1. S/p SBR for crohn's disease and perf viscous 2. Post op ileus, resolved  Plan: 1. Advance diet  2. Dc PCA 3. HH for wound 4. PT/OT to eval and make recommendations for discharge planning 5. Maybe home tomorrow   LOS: 9 days    Hamda Klutts E 02/01/2012, 8:33 AM Pager: [phone]

## 2012-02-02 MED ORDER — FUROSEMIDE 10 MG/ML IJ SOLN
40.0000 mg | Freq: Once | INTRAMUSCULAR | Status: AC
  Administered 2012-02-02: 40 mg via INTRAVENOUS
  Filled 2012-02-02 (×2): qty 4

## 2012-02-02 MED ORDER — ALUM & MAG HYDROXIDE-SIMETH 200-200-20 MG/5ML PO SUSP
30.0000 mL | ORAL | Status: DC | PRN
  Administered 2012-02-02 – 2012-02-03 (×2): 30 mL via ORAL
  Filled 2012-02-02 (×2): qty 30

## 2012-02-02 NOTE — Progress Notes (Signed)
Patient ID: Michele David, female   DOB: 06/18/1945, 66 y.o.   MRN: [id number] 10 Days Post-Op  Subjective: Pt feels ok, but had several BMs after eating yesterday.    Objective: Vital signs in last 24 hours: Temp:  [97.9 F (36.6 C)-98.5 F (36.9 C)] 98 F (36.7 C) (10/15 0520) Pulse Rate:  [66-68] 68  (10/15 0520) Resp:  [16-18] 18  (10/15 0520) BP: (137-142)/(61-77) 142/77 mmHg (10/15 0520) SpO2:  [96 %-100 %] 96 % (10/15 0520) Last BM Date: 02/01/12  Intake/Output from previous day: 10/14 0701 - 10/15 0700 In: 240 [P.O.:240] Out: 475 [Urine:475] Intake/Output this shift:    PE: Abd: soft, wound is stable, +BS, ND Ext: BLE edema  Lab Results:  No results found for this basename: WBC:2,HGB:2,HCT:2,PLT:2 in the last 72 hours BMET  [facility] 02/01/12 0343  NA 137  K 3.4*  CL 104  CO2 26  GLUCOSE 124*  BUN 14  CREATININE 0.89  CALCIUM 6.7*   PT/INR No results found for this basename: LABPROT:2,INR:2 in the last 72 hours CMP     Component Value Date/Time   NA 137 02/01/2012 0343   K 3.4* 02/01/2012 0343   CL 104 02/01/2012 0343   CO2 26 02/01/2012 0343   GLUCOSE 124* 02/01/2012 0343   BUN 14 02/01/2012 0343   CREATININE 0.89 02/01/2012 0343   CALCIUM 6.7* 02/01/2012 0343   PROT 5.9* 01/23/2012 1656   ALBUMIN 3.1* 01/23/2012 1656   AST 18 01/23/2012 1656   ALT 15 01/23/2012 1656   ALKPHOS 85 01/23/2012 1656   BILITOT 0.6 01/23/2012 1656   GFRNONAA 66* 02/01/2012 0343   GFRAA 76* 02/01/2012 0343   Lipase     Component Value Date/Time   LIPASE 8* 01/23/2012 1656       Studies/Results: Dg Chest 2 View  01/31/2012  *RADIOLOGY REPORT*  Clinical Data: Short of breath.  Weakness.  CHEST - 2 VIEW  Comparison: 01/26/2012.  Findings: Interval removal of nasogastric tube.  Colonic air fluid levels are present in the upper abdomen.  Elevation of the right hemidiaphragm.  Persistent right basilar atelectasis or infiltrate. Small left pleural effusion with  blunting of the left costophrenic angle.  Retrocardiac density most compatible with atelectasis and effusion in combination.  Mediastinal contours unchanged.  IMPRESSION:  1.  Interval removal of nasogastric tube. 2.  Bilateral pleural effusions, greater on the right than left with basilar collapse / consolidation.   Original Report Authenticated By: Andreas Newport, M.D.     Anti-infectives: Anti-infectives     Start     Dose/Rate Route Frequency Ordered Stop   01/23/12 2200  piperacillin-tazobactam (ZOSYN) IVPB 3.375 g       3.375 g 12.5 mL/hr over 240 Minutes Intravenous 3 times per day 01/23/12 1959     01/23/12 1830  piperacillin-tazobactam (ZOSYN) IVPB 3.375 g       3.375 g 100 mL/hr over 30 Minutes Intravenous To Emergency Dept 01/23/12 1745 01/23/12 1911           Assessment/Plan  1. S/p ex lap with SBR 2. H/o lymphedema per pt  Plan: 1. Add compression stockings to legs as patient normally wears these at home. 2. After PT/OT recs, will ask SW to talk to patient about ST-SNF placement. 3. Talked to patient about BMs and how this is expected after this type of an operation and how this should improve over time.   LOS: 10 days    [name] E 02/02/2012, 9:38 AM  Pager: [phone]

## 2012-02-02 NOTE — Clinical Social Work Placement (Addendum)
     Clinical Social Work Department CLINICAL SOCIAL WORK PLACEMENT NOTE 02/03/2012  Patient:  LILLIEMAE, [place]  Account Number:  [id number] Admit date:  01/23/2012  Clinical Social Worker:  Robin Searing  Date/time:  02/02/2012 11:39 AM  Clinical Social Work is seeking post-discharge placement for this patient at the following level of care:   SKILLED NURSING   (*CSW will update this form in Epic as items are completed)   02/02/2012  Patient/family provided with [facility] Department of Clinical Social Works list of facilities offering this level of care within the geographic area requested by the patient (or if unable, by the patients family).  02/02/2012  Patient/family informed of their freedom to choose among providers that offer the needed level of care, that participate in Medicare, Medicaid or managed care program needed by the patient, have an available bed and are willing to accept the patient.  02/02/2012  Patient/family informed of [facility] ownership interest in [facility], as well as of the fact that they are under no obligation to receive care at this facility.  PASARR submitted to EDS on 02/02/2012 PASARR number received from EDS on 02/03/2012  FL2 transmitted to all facilities in geographic area requested by pt/family on  02/02/2012 FL2 transmitted to all facilities within larger geographic area on   Patient informed that his/her managed care company has contracts with or will negotiate with  certain facilities, including the following:     Patient/family informed of bed offers received:  02/03/2012 Patient chooses bed at [facility] Physician recommends and patient chooses bed at    Patient to be transferred to [facility] on  02/03/2012 Patient to be transferred to facility by [facility]  The following physician request were entered in Epic:   Additional Comments:

## 2012-02-02 NOTE — Progress Notes (Signed)
Spoke to [employer], [place] informed her that per Lauris Poag, RN [facility] patient c/o chest tightness but VSS. I have seen patient continues to have SOB with exertion, but now states tightness lessened but points to epigastric area stats she believes is gas and dose not want any more percocet this am. Tresa Endo states to give mylanta and assess and report if chest tightness continues, patient currently talking on phone.

## 2012-02-02 NOTE — Progress Notes (Signed)
Spoke with patient about benefit of lasix earlier in day to prevent nighttime urination and improve SOB, is agreeable to taking now, had wanted to wait until this afternoon. No further c/o indigestion and tightness

## 2012-02-02 NOTE — Clinical Social Work Psychosocial (Signed)
     Clinical Social Work Department BRIEF PSYCHOSOCIAL ASSESSMENT 02/02/2012  Patient:  Michele David, Michele David     Account Number:  [id number]     Admit date:  01/23/2012  Clinical Social Worker:  Robin Searing  Date/Time:  02/02/2012 11:34 AM  Referred by:  Physician  Date Referred:  02/02/2012 Referred for  SNF Placement   Other Referral:   Interview type:  Patient Other interview type:   Also spoke with daughter via phone    PSYCHOSOCIAL DATA Living Status:  FAMILY Admitted from facility:   Level of care:   Primary support name:  daughter and son Primary support relationship to patient:  FAMILY Degree of support available:   good- unclear if 24 hour is available    CURRENT CONCERNS Current Concerns  Post-Acute Placement   Other Concerns:    SOCIAL WORK ASSESSMENT / PLAN Patient is willing to consider SNF however is not completely sure she wants to pursue it- she has a daughter and a son who are able to help however she is not certain they can provide 24 hour care. She is willing to proceed with SNF search in casr she wants/needs to do this.   Assessment/plan status:  Other - See comment Other assessment/ plan:   Proceed with SNF search and f/u with patient and family for further discussion   Information/referral to community resources:   SNF  [facility]    PATIENTS/FAMILYS RESPONSE TO PLAN OF CARE: Patient and family agreeable to SNF search-

## 2012-02-02 NOTE — Evaluation (Signed)
Occupational Therapy Evaluation Patient Details Name: Michele David MRN: [id number] DOB: 1945-08-25 Today's Date: 02/02/2012 Time: 6578-4696 OT Time Calculation (min): 31 min  OT Assessment / Plan / Recommendation Clinical Impression  This 66 year old female was admitted for laparatomy after bowel perforation due to Crohns disease.  She presents with decreased adls due to pain and decreased general strength/endurance.  She will benefit from skilled OT.    OT Assessment  Patient needs continued OT Services    Follow Up Recommendations  Skilled nursing facility    Barriers to Discharge      Equipment Recommendations  Rolling walker with 5" wheels    Recommendations for Other Services    Frequency  Min 2X/week    Precautions / Restrictions Precautions Precautions: Fall Restrictions Weight Bearing Restrictions: No   Pertinent Vitals/Pain Abdominal pain due to gas:  Repositioned herself back to lying to help alleviate    ADL  Eating/Feeding: Simulated;Independent Where Assessed - Eating/Feeding: Edge of bed Grooming: Performed;Set up Where Assessed - Grooming: Unsupported sitting Upper Body Bathing: Performed;Set up Where Assessed - Upper Body Bathing: Unsupported sitting Lower Body Bathing: Performed;Minimal assistance Where Assessed - Lower Body Bathing: Supported sit to stand Upper Body Dressing: Performed;Minimal assistance (line) Where Assessed - Upper Body Dressing: Unsupported sitting Lower Body Dressing: Performed;Moderate assistance Where Assessed - Lower Body Dressing: Supported sit to stand Toilet Transfer: Mining engineer Method: Sit to stand Toileting - Clothing Manipulation and Hygiene: Simulated;Min guard Where Assessed - Toileting Clothing Manipulation and Hygiene: Sit to stand from 3-in-1 or toilet ADL Comments: fatiques easily.  Encouraged to take rest breaks.  Began energy conservation education    OT Diagnosis:  Generalized weakness  OT Problem List: Decreased strength;Decreased activity tolerance;Decreased knowledge of use of DME or AE;Pain OT Treatment Interventions: Self-care/ADL training;DME and/or AE instruction;Energy conservation;Therapeutic activities;Patient/family education   OT Goals Acute Rehab OT Goals OT Goal Formulation: With patient Time For Goal Achievement: 02/16/12 Potential to Achieve Goals: Good ADL Goals Pt Will Perform Grooming: with supervision;Standing at sink (2 tasks for endurance) ADL Goal: Grooming - Progress: Goal set today Pt Will Perform Lower Body Bathing: with supervision;Sit to stand from bed;with adaptive equipment ADL Goal: Lower Body Bathing - Progress: Goal set today Pt Will Perform Lower Body Dressing: with supervision;Sit to stand from bed;with adaptive equipment ADL Goal: Lower Body Dressing - Progress: Goal set today Pt Will Transfer to Toilet: with supervision;Ambulation;3-in-1 ADL Goal: Toilet Transfer - Progress: Goal set today Miscellaneous OT Goals Miscellaneous OT Goal #1: pt will incorporate 3 energy conservation principles into adl session OT Goal: Miscellaneous Goal #1 - Progress: Goal set today  Visit Information  Last OT Received On: 02/02/12 Assistance Needed: +1    Subjective Data  Subjective: I want someone to help me wash up   Prior Functioning     Home Living Lives With: Alone Available Help at Discharge: Family;Personal care attendant;Available PRN/intermittently Type of Home: House Home Access: Stairs to enter Entrance Stairs-Number of Steps: 1 Home Layout: Two level;Full bath on main level;Other (Comment) Alternate Level Stairs-Number of Steps: flight Alternate Level Stairs-Rails: Right Bathroom Shower/Tub: Health visitor: Standard Home Adaptive Equipment: None Prior Function Level of Independence: Independent Driving: Yes Communication Communication: No difficulties Dominant Hand: Right          Vision/Perception     Cognition  Overall Cognitive Status: Appears within functional limits for tasks assessed/performed Arousal/Alertness: Awake/alert Orientation Level: Appears intact for tasks assessed Behavior During Session: [facility]  for tasks performed    Extremity/Trunk Assessment Right Upper Extremity Assessment RUE ROM/Strength/Tone: [facility] for tasks assessed Left Upper Extremity Assessment LUE ROM/Strength/Tone: [facility] for tasks assessed     Mobility Bed Mobility Bed Mobility: Supine to Sit;Sitting - Scoot to Edge of Bed;Sit to Supine Supine to Sit: 5: Supervision Transfers Sit to Stand: With upper extremity assist;From bed;4: Min assist     Shoulder Instructions     Exercise     Balance     End of Session OT - End of Session Activity Tolerance: Patient limited by fatigue;Patient limited by pain  GO     Michele David 02/02/2012, 8:53 AM Michele David, OTR/L [phone] 02/02/2012

## 2012-02-02 NOTE — Progress Notes (Signed)
Patient talking on phone, only c/o tightness to epigastric but states it is much better and almost gone. Medicated with mylanta per PA instructions.

## 2012-02-02 NOTE — Progress Notes (Signed)
Patient interviewed and examined, agree with PA note above.  Mariella Saa MD, FACS  02/02/2012 12:39 PM

## 2012-02-03 ENCOUNTER — Telehealth (INDEPENDENT_AMBULATORY_CARE_PROVIDER_SITE_OTHER): Payer: Self-pay

## 2012-02-03 MED ORDER — LOPERAMIDE HCL 2 MG PO CAPS: 2.0000 mg | ORAL_CAPSULE | ORAL | Status: DC | PRN

## 2012-02-03 MED ORDER — OXYCODONE-ACETAMINOPHEN 5-325 MG PO TABS: 1.0000 | ORAL_TABLET | ORAL | Status: DC | PRN

## 2012-02-03 NOTE — Discharge Summary (Signed)
Patient ID: Michele David MRN: [id number] DOB/AGE: [birth year] 67 y.o.  Admit date: 01/23/2012 Discharge date: 02/03/2012  Procedures:  1. Exploratory laparotomy.  2. Extensive lysis of adhesions (80 minutes).  3. Resection of ileocolonic anastomosis with perforation.  4. Ileocolonic anastomosis between mid ileum and proximal transverse  colon.  Consults: None  Reason for Admission: patient is a 66 year old black female referred from urgent care to the emergency department for evaluation of acute onset of abdominal pain. Patient experienced onset of lower abdominal pain approximately 4 AM on the day of admission. Pain radiated to the right shoulder and to the back. She developed nausea. She presented to the emergency department. White blood cell count was normal at 9.5 but there was a significant left shift of 93%. CT scan of the abdomen and pelvis was obtained and shows findings consistent with active Crohn's disease with perforation of the distal small bowel. General surgery is consulted for management.  Admission Diagnoses:  1. Crohn's disease with perforation and free intraperitoneal air  - likely involvement of distal small bowel and proximal colon  - will require laparotomy, bowel resection, possible ileostomy, open wound  - IV Zosyn and Vanco initiated by ER MD  - OR aware and preparing for procedure this evening  2. HTN  3. Chronic lymphedema right lower extremity - ? Etiology  Hospital Course: The patient was admitted and taken to the operating room where she underwent an exploratory laparotomy with LOA and small bowel resection for perforation secondary to crohn's disease.  The patient tolerated the procedure well and had an NGT placed.  She was sent to the SDU initially.  She was on zosyn and her wound was left open due to contamination.  Dressing changes were started.  She eventually got a VAC placed, but this was removed prior to dc secondary to some necrotic tissue at the  superior base of the wound.  WD dressing changes were resumed.    She was transferred to the floor on POD#2.  Her NGT remained in place secondary to a postoperative ileus.  She finally started passing some flatus on POD# 5 and her NGT was clamped.  It was able to be removed the following day and she was started on a diet.  This was advanced as tolerated.  She did have a little trouble with solid foods and was encouraged to maintain a full liquid diet with nutritional supplements for a few days after discharge and advance her diet as able.    She did have some diarrhea once her bowels started to function.  She was given a small dose of imodium for this for symptom control.  Scheduled imodium was not given as we did not want to constipate the patient.    She did have 2 episodes of some fluid overload.  She was given lasix and this improved by the following day. Otherwise the patient was stable throughout her hospitalization.  Due to deconditioning, she was discharged to a SNF on POD # 11.  Discharge Diagnoses:  Principal Problem:  *Crohn's disease of small intestine with complication Active Problems:  Perforated viscus s/p ex lap with SBR Deconditioning lymphedema  Discharge Medications:   Medication List     As of 02/03/2012 10:57 AM    TAKE these medications         denosumab 60 MG/ML Soln injection   Commonly known as: PROLIA   Inject 60 mg into the skin every 6 (six) months. Administer in upper arm, thigh,  or abdomen      furosemide 20 MG tablet   Commonly known as: LASIX   Take 20 mg by mouth daily as needed. For fluid retention      naproxen sodium 220 MG tablet   Commonly known as: ANAPROX   Take 220 mg by mouth 2 (two) times daily as needed. For pain      olmesartan 20 MG tablet   Commonly known as: BENICAR   Take 20 mg by mouth every morning.      oxyCODONE-acetaminophen 5-325 MG per tablet   Commonly known as: PERCOCET/ROXICET   Take 1-2 tablets by mouth every 4 (four)  hours as needed.      vitamin B-12 1000 MCG tablet   Commonly known as: CYANOCOBALAMIN   Take 1,000 mcg by mouth daily.      vitamin E 400 UNIT capsule   Take 400 Units by mouth daily.        Discharge Instructions:     Follow-up Information    Follow up with Velora Heckler, MD. In 3 weeks. (our office will call you)    Contact information:   [address] [place] [address] [phone]        Try some full liquids or mechanical soft diet for several days with some nutritional supplements until able to tolerate solid food a little better.  Normal Saline wet to dry dressing changes to her abdominal wound BID  Signed: Kaela Beitz E 02/03/2012, 10:57 AM

## 2012-02-03 NOTE — Progress Notes (Signed)
SNF bed selected at [facility]. Spoke with patient and daughter who are in agreement with these plans. Will await final d/c orders for probable d/c today. Reece Levy, MSW, Theresia Majors [phone]

## 2012-02-03 NOTE — Progress Notes (Signed)
Assessment unchanged. No complaints voiced. Daughter at bedside preparing to transport pt to [facility]. AVS reviewed with pt and daughter with verbalized understanding. Packet of copied chart information and prescriptions for [facility] given to daughter prior to dc to present upon arrival to facility. Discharged via wc to front entrance to meet awaiting vehicle to carry to [facility] , accompanied by daughter and NT.

## 2012-02-03 NOTE — Progress Notes (Signed)
Report given via phone to Roanna Raider, RN at [facility] regarding pt status and history. Pt preparing for dc to [facility] via car. Daughter to transport pt to facility.

## 2012-02-03 NOTE — Telephone Encounter (Signed)
Per D/C instructions, I spoke with Jacki Cones at [facility] center [phone]. PO appt given to her for pt[ 03-02-12 at 10:15am.

## 2012-02-03 NOTE — Progress Notes (Signed)
Patient ID: Michele David, female   DOB: 30-Sep-1945, 66 y.o.   MRN: [id number] 11 Days Post-Op  Subjective: Pt feeling better today, but still with a little nausea with eating solid foods.  Having some diarrhea as well. Specifically right after she eats.  Objective: Vital signs in last 24 hours: Temp:  [98.7 F (37.1 C)-99.7 F (37.6 C)] 99 F (37.2 C) (10/16 0522) Pulse Rate:  [67-86] 70  (10/16 0522) Resp:  [18] 18  (10/16 0522) BP: (150-181)/(52-73) 150/73 mmHg (10/16 0522) SpO2:  [96 %-99 %] 96 % (10/16 0522) Last BM Date: 02/02/12  Intake/Output from previous day: 10/15 0701 - 10/16 0700 In: 677 [P.O.:320; I.V.:357] Out: -  Intake/Output this shift:    PE: Abd: soft, minimally tender, wound is stable and mostly clean, +BS  Lab Results:  No results found for this basename: WBC:2,HGB:2,HCT:2,PLT:2 in the last 72 hours BMET  [facility] 02/01/12 0343  NA 137  K 3.4*  CL 104  CO2 26  GLUCOSE 124*  BUN 14  CREATININE 0.89  CALCIUM 6.7*   PT/INR No results found for this basename: LABPROT:2,INR:2 in the last 72 hours CMP     Component Value Date/Time   NA 137 02/01/2012 0343   K 3.4* 02/01/2012 0343   CL 104 02/01/2012 0343   CO2 26 02/01/2012 0343   GLUCOSE 124* 02/01/2012 0343   BUN 14 02/01/2012 0343   CREATININE 0.89 02/01/2012 0343   CALCIUM 6.7* 02/01/2012 0343   PROT 5.9* 01/23/2012 1656   ALBUMIN 3.1* 01/23/2012 1656   AST 18 01/23/2012 1656   ALT 15 01/23/2012 1656   ALKPHOS 85 01/23/2012 1656   BILITOT 0.6 01/23/2012 1656   GFRNONAA 66* 02/01/2012 0343   GFRAA 76* 02/01/2012 0343   Lipase     Component Value Date/Time   LIPASE 8* 01/23/2012 1656       Studies/Results: No results found.  Anti-infectives: Anti-infectives     Start     Dose/Rate Route Frequency Ordered Stop   01/23/12 2200  piperacillin-tazobactam (ZOSYN) IVPB 3.375 g       3.375 g 12.5 mL/hr over 240 Minutes Intravenous 3 times per day 01/23/12 1959     01/23/12 1830   piperacillin-tazobactam (ZOSYN) IVPB 3.375 g       3.375 g 100 mL/hr over 30 Minutes Intravenous To Emergency Dept 01/23/12 1745 01/23/12 1911           Assessment/Plan  1. S/p ex lap with SBR for perf viscous secondary to crohn's disease 2. Lymphedema 3. Deconditioning  Plan: 1. Await SNF offers 2. Lymphedema much better today after lasix yesterday 3. Add a small amount of imodium to help with some of her diarrhea 4. Cont regular diet, bowels are working, unsure why she gets a little nauseated.   LOS: 11 days    Quanta Robertshaw E 02/03/2012, 8:40 AM Pager: [phone]

## 2012-02-03 NOTE — Progress Notes (Signed)
Patient interviewed and examined, agree with PA note above.  Mariella Saa MD, FACS  02/03/2012 6:17 PM

## 2012-02-06 ENCOUNTER — Encounter (HOSPITAL_COMMUNITY): Payer: Self-pay | Admitting: *Deleted

## 2012-02-06 ENCOUNTER — Inpatient Hospital Stay (HOSPITAL_COMMUNITY)
Admission: EM | Admit: 2012-02-06 | Discharge: 2012-03-07 | DRG: 862 | Disposition: A | Discharge status: Home Health | Payer: Medicare Other | Attending: General Surgery | Admitting: General Surgery

## 2012-02-06 ENCOUNTER — Emergency Department (HOSPITAL_COMMUNITY): Payer: Medicare Other

## 2012-02-06 DIAGNOSIS — A419 Sepsis, unspecified organism: Secondary | ICD-10-CM

## 2012-02-06 DIAGNOSIS — K651 Peritoneal abscess: Secondary | ICD-10-CM | POA: Diagnosis present

## 2012-02-06 DIAGNOSIS — D62 Acute posthemorrhagic anemia: Secondary | ICD-10-CM | POA: Diagnosis present

## 2012-02-06 DIAGNOSIS — J90 Pleural effusion, not elsewhere classified: Secondary | ICD-10-CM | POA: Diagnosis present

## 2012-02-06 DIAGNOSIS — D649 Anemia, unspecified: Secondary | ICD-10-CM

## 2012-02-06 DIAGNOSIS — J189 Pneumonia, unspecified organism: Secondary | ICD-10-CM

## 2012-02-06 DIAGNOSIS — K50019 Crohn's disease of small intestine with unspecified complications: Secondary | ICD-10-CM | POA: Diagnosis present

## 2012-02-06 DIAGNOSIS — E876 Hypokalemia: Secondary | ICD-10-CM

## 2012-02-06 DIAGNOSIS — IMO0002 Reserved for concepts with insufficient information to code with codable children: Secondary | ICD-10-CM | POA: Diagnosis present

## 2012-02-06 DIAGNOSIS — J9811 Atelectasis: Secondary | ICD-10-CM | POA: Diagnosis present

## 2012-02-06 DIAGNOSIS — K259 Gastric ulcer, unspecified as acute or chronic, without hemorrhage or perforation: Secondary | ICD-10-CM | POA: Diagnosis present

## 2012-02-06 DIAGNOSIS — T8140XA Infection following a procedure, unspecified, initial encounter: Principal | ICD-10-CM | POA: Diagnosis present

## 2012-02-06 DIAGNOSIS — R599 Enlarged lymph nodes, unspecified: Secondary | ICD-10-CM | POA: Diagnosis present

## 2012-02-06 DIAGNOSIS — I10 Essential (primary) hypertension: Secondary | ICD-10-CM | POA: Diagnosis present

## 2012-02-06 DIAGNOSIS — Z8719 Personal history of other diseases of the digestive system: Secondary | ICD-10-CM

## 2012-02-06 DIAGNOSIS — D735 Infarction of spleen: Secondary | ICD-10-CM

## 2012-02-06 DIAGNOSIS — Z6826 Body mass index (BMI) 26.0-26.9, adult: Secondary | ICD-10-CM

## 2012-02-06 DIAGNOSIS — E785 Hyperlipidemia, unspecified: Secondary | ICD-10-CM | POA: Diagnosis present

## 2012-02-06 DIAGNOSIS — K449 Diaphragmatic hernia without obstruction or gangrene: Secondary | ICD-10-CM | POA: Diagnosis present

## 2012-02-06 DIAGNOSIS — K222 Esophageal obstruction: Secondary | ICD-10-CM | POA: Diagnosis present

## 2012-02-06 DIAGNOSIS — E43 Unspecified severe protein-calorie malnutrition: Secondary | ICD-10-CM | POA: Diagnosis present

## 2012-02-06 DIAGNOSIS — J9819 Other pulmonary collapse: Secondary | ICD-10-CM | POA: Diagnosis present

## 2012-02-06 DIAGNOSIS — K921 Melena: Secondary | ICD-10-CM | POA: Diagnosis not present

## 2012-02-06 DIAGNOSIS — Y832 Surgical operation with anastomosis, bypass or graft as the cause of abnormal reaction of the patient, or of later complication, without mention of misadventure at the time of the procedure: Secondary | ICD-10-CM | POA: Diagnosis present

## 2012-02-06 DIAGNOSIS — K319 Disease of stomach and duodenum, unspecified: Secondary | ICD-10-CM | POA: Diagnosis present

## 2012-02-06 DIAGNOSIS — R188 Other ascites: Secondary | ICD-10-CM

## 2012-02-06 DIAGNOSIS — R198 Other specified symptoms and signs involving the digestive system and abdomen: Secondary | ICD-10-CM

## 2012-02-06 DIAGNOSIS — Z79899 Other long term (current) drug therapy: Secondary | ICD-10-CM

## 2012-02-06 DIAGNOSIS — D7389 Other diseases of spleen: Secondary | ICD-10-CM | POA: Diagnosis present

## 2012-02-06 DIAGNOSIS — Z9049 Acquired absence of other specified parts of digestive tract: Secondary | ICD-10-CM

## 2012-02-06 HISTORY — DX: Reserved for concepts with insufficient information to code with codable children: IMO0002

## 2012-02-06 HISTORY — DX: Perforation of intestine (nontraumatic): K63.1

## 2012-02-06 HISTORY — DX: Crohn's disease, unspecified, without complications: K50.90

## 2012-02-06 HISTORY — DX: Other specified soft tissue disorders: M79.89

## 2012-02-06 HISTORY — DX: Hyperlipidemia, unspecified: E78.5

## 2012-02-06 HISTORY — DX: Essential (primary) hypertension: I10

## 2012-02-06 LAB — CBC
HCT: 21.9 % — ABNORMAL LOW (ref 36.0–46.0)
Hemoglobin: 7.6 g/dL — ABNORMAL LOW (ref 12.0–15.0)
MCH: 28.9 pg (ref 26.0–34.0)
MCHC: 34.7 g/dL (ref 30.0–36.0)
MCV: 83.3 fL (ref 78.0–100.0)
Platelets: 611 10*3/uL — ABNORMAL HIGH (ref 150–400)
RBC: 2.63 MIL/uL — ABNORMAL LOW (ref 3.87–5.11)
RDW: 16.1 % — ABNORMAL HIGH (ref 11.5–15.5)
WBC: 15.1 10*3/uL — ABNORMAL HIGH (ref 4.0–10.5)

## 2012-02-06 LAB — URINALYSIS, ROUTINE W REFLEX MICROSCOPIC
Glucose, UA: NEGATIVE mg/dL
Hgb urine dipstick: NEGATIVE
Ketones, ur: NEGATIVE mg/dL
Leukocytes, UA: NEGATIVE
Nitrite: NEGATIVE
Protein, ur: 100 mg/dL — AB
Specific Gravity, Urine: 1.027 (ref 1.005–1.030)
Urobilinogen, UA: 0.2 mg/dL (ref 0.0–1.0)
pH: 6 (ref 5.0–8.0)

## 2012-02-06 LAB — BASIC METABOLIC PANEL
BUN: 12 mg/dL (ref 6–23)
CO2: 23 mEq/L (ref 19–32)
Calcium: 8.1 mg/dL — ABNORMAL LOW (ref 8.4–10.5)
Chloride: 99 mEq/L (ref 96–112)
Creatinine, Ser: 0.81 mg/dL (ref 0.50–1.10)
GFR calc Af Amer: 85 mL/min — ABNORMAL LOW (ref >90)
GFR calc non Af Amer: 73 mL/min — ABNORMAL LOW (ref >90)
Glucose, Bld: 151 mg/dL — ABNORMAL HIGH (ref 70–99)
Potassium: 3.1 mEq/L — ABNORMAL LOW (ref 3.5–5.1)
Sodium: 133 mEq/L — ABNORMAL LOW (ref 135–145)

## 2012-02-06 LAB — LACTIC ACID, PLASMA: Lactic Acid, Venous: 1.7 mmol/L (ref 0.5–2.2)

## 2012-02-06 LAB — URINE MICROSCOPIC-ADD ON

## 2012-02-06 LAB — ABO/RH: ABO/RH(D): O POS

## 2012-02-06 LAB — PREPARE RBC (CROSSMATCH)

## 2012-02-06 MED ORDER — POTASSIUM CHLORIDE 10 MEQ/100ML IV SOLN
10.0000 meq | Freq: Once | INTRAVENOUS | Status: AC
  Administered 2012-02-06: 10 meq via INTRAVENOUS
  Filled 2012-02-06: qty 100

## 2012-02-06 MED ORDER — POTASSIUM CHLORIDE 10 MEQ/100ML IV SOLN
10.0000 meq | INTRAVENOUS | Status: AC
  Administered 2012-02-06 – 2012-02-07 (×3): 10 meq via INTRAVENOUS
  Filled 2012-02-06: qty 300

## 2012-02-06 MED ORDER — ONDANSETRON HCL 4 MG/2ML IJ SOLN
4.0000 mg | Freq: Once | INTRAMUSCULAR | Status: AC
  Administered 2012-02-06: 4 mg via INTRAVENOUS
  Filled 2012-02-06: qty 2

## 2012-02-06 MED ORDER — PANTOPRAZOLE SODIUM 40 MG IV SOLR
40.0000 mg | Freq: Every day | INTRAVENOUS | Status: DC
  Administered 2012-02-06 – 2012-02-15 (×10): 40 mg via INTRAVENOUS
  Filled 2012-02-06 (×11): qty 40

## 2012-02-06 MED ORDER — FUROSEMIDE 10 MG/ML IJ SOLN
20.0000 mg | Freq: Once | INTRAMUSCULAR | Status: AC
  Administered 2012-02-06: 20 mg via INTRAVENOUS
  Filled 2012-02-06 (×2): qty 2

## 2012-02-06 MED ORDER — IOHEXOL 300 MG/ML  SOLN
100.0000 mL | Freq: Once | INTRAMUSCULAR | Status: AC | PRN
  Administered 2012-02-06: 100 mL via INTRAVENOUS

## 2012-02-06 MED ORDER — SODIUM CHLORIDE 0.9 % IV BOLUS (SEPSIS)
1000.0000 mL | Freq: Once | INTRAVENOUS | Status: AC
  Administered 2012-02-06: 1000 mL via INTRAVENOUS

## 2012-02-06 MED ORDER — FENTANYL CITRATE 0.05 MG/ML IJ SOLN
25.0000 ug | INTRAMUSCULAR | Status: DC | PRN
  Administered 2012-02-07 – 2012-02-09 (×11): 50 ug via INTRAVENOUS
  Administered 2012-02-09: 25 ug via INTRAVENOUS
  Administered 2012-02-10 (×3): 50 ug via INTRAVENOUS
  Administered 2012-02-10: 25 ug via INTRAVENOUS
  Administered 2012-02-11 – 2012-02-18 (×37): 50 ug via INTRAVENOUS
  Administered 2012-02-18: 25 ug via INTRAVENOUS
  Administered 2012-02-18 (×2): 50 ug via INTRAVENOUS
  Administered 2012-02-18 (×2): 25 ug via INTRAVENOUS
  Administered 2012-02-18 – 2012-02-19 (×2): 50 ug via INTRAVENOUS
  Administered 2012-02-19: 25 ug via INTRAVENOUS
  Administered 2012-02-19 (×2): 50 ug via INTRAVENOUS
  Filled 2012-02-06 (×65): qty 2

## 2012-02-06 MED ORDER — VANCOMYCIN HCL IN DEXTROSE 1-5 GM/200ML-% IV SOLN
1000.0000 mg | Freq: Two times a day (BID) | INTRAVENOUS | Status: DC
  Administered 2012-02-06 – 2012-02-08 (×4): 1000 mg via INTRAVENOUS
  Filled 2012-02-06 (×4): qty 200

## 2012-02-06 MED ORDER — HEPARIN SODIUM (PORCINE) 5000 UNIT/ML IJ SOLN
5000.0000 [IU] | Freq: Three times a day (TID) | INTRAMUSCULAR | Status: DC
  Administered 2012-02-06 – 2012-03-01 (×70): 5000 [IU] via SUBCUTANEOUS
  Filled 2012-02-06 (×74): qty 1

## 2012-02-06 MED ORDER — DEXTROSE 5 % IV SOLN
1.0000 g | Freq: Three times a day (TID) | INTRAVENOUS | Status: DC
  Administered 2012-02-06 – 2012-02-08 (×6): 1 g via INTRAVENOUS
  Filled 2012-02-06 (×7): qty 1

## 2012-02-06 MED ORDER — SODIUM CHLORIDE 0.9 % IV SOLN
INTRAVENOUS | Status: DC
  Administered 2012-02-07: 100 mL via INTRAVENOUS
  Administered 2012-02-07: 22:00:00 via INTRAVENOUS
  Administered 2012-02-08: 100 mL/h via INTRAVENOUS

## 2012-02-06 MED ORDER — LEVOFLOXACIN IN D5W 750 MG/150ML IV SOLN
750.0000 mg | INTRAVENOUS | Status: DC
Start: 2012-02-06 — End: 2012-02-08
  Administered 2012-02-06 – 2012-02-07 (×2): 750 mg via INTRAVENOUS
  Filled 2012-02-06 (×3): qty 150

## 2012-02-06 MED ORDER — ACETAMINOPHEN 650 MG RE SUPP: 650.0000 mg | Freq: Once | RECTAL | Status: DC

## 2012-02-06 NOTE — ED Notes (Signed)
Report given to ICU nurse Angelique Blonder.

## 2012-02-06 NOTE — ED Notes (Signed)
AVW:[id number]<XB> Expected date:02/06/12<BR> Expected time:12:38 PM<BR> Means of arrival:Ambulance<BR> Comments:<BR> FEVER

## 2012-02-06 NOTE — H&P (Signed)
Name: Michele David MRN: [id number] DOB: [birth year]    LOS: 0  Referring Provider:  EDP Reason for Referral:  sepsis  PULMONARY / CRITICAL CARE MEDICINE  HPI:  66 year old black female admitted 10/5 for bowel perforation and subsequently underwent ex lap on 10/5 and had R hemicolectomy with anastomosis.  She had volume excess and received lasix. She was d/c to SNF on POD #11 10/16.   Pt returns today 10/19 with abdominal pain and resp distress. She is found with pleural effusion and fluid collection under ventral abdominal wall.  PCCM asked to admit with [facility] surgery to consult. PMHx only pos for 1. Crohns Dz.   2. HTN   3. Chronic lymphedema right lower extremity - ? Etiology    Past Medical History  Diagnosis Date  . Bowel perforation   . Crohn disease   . Hypertension   . Hyperlipemia   . Swelling of extremity, left   . Swelling of extremity, right    Past Surgical History  Procedure Date  . Laparotomy 01/23/2012    Procedure: EXPLORATORY LAPAROTOMY;  Surgeon: Velora Heckler, MD;  Location: [facility] ORS;  Service: General;  Laterality: N/A;  lysis of adhesions resection ileocolonic anastamosos with perforation   Prior to Admission medications   Medication Sig Start Date End Date Taking? Authorizing Provider  furosemide (LASIX) 20 MG tablet Take 20 mg by mouth daily as needed. For fluid retention   Yes Historical Provider, MD  losartan (COZAAR) 50 MG tablet Take 50 mg by mouth daily.   Yes Historical Provider, MD  naproxen sodium (ANAPROX) 220 MG tablet Take 220 mg by mouth 2 (two) times daily as needed. For pain   Yes Historical Provider, MD  oxyCODONE-acetaminophen (PERCOCET/ROXICET) 5-325 MG per tablet Take 1-2 tablets by mouth every 4 (four) hours as needed. 02/03/12  Yes Letha Cape, PA  vitamin B-12 (CYANOCOBALAMIN) 1000 MCG tablet Take 1,000 mcg by mouth daily.   Yes Historical Provider, MD  vitamin E 400 UNIT capsule Take 400 Units by mouth daily.   Yes Historical  Provider, MD  denosumab (PROLIA) 60 MG/ML SOLN injection Inject 60 mg into the skin every 6 (six) months. Administer in upper arm, thigh, or abdomen    Historical Provider, MD   Allergies Allergies  Allergen Reactions  . Tomato Other (See Comments)    Reaction: caused bumps in her mouth    Family History History reviewed. No pertinent family history. Social History  reports that she has quit smoking. She has never used smokeless tobacco. She reports that she does not drink alcohol or use illicit drugs.  Review Of Systems:  Taken in detail and neg except as above  Brief patient description:  66 yo AAF with abdominal fluid collection and s/p crohns dz with perf and exlap 14 days ago.  Events Since Admission:   Current Status: Fragile, guarded.  Vital Signs: Temp:  [98.2 F (36.8 C)-100.8 F (38.2 C)] 99.1 F (37.3 C) (10/19 1724) Pulse Rate:  [75] 75  (10/19 1306) Resp:  [24-32] 32  (10/19 1724) BP: (135-152)/(53-59) 152/59 mmHg (10/19 1724) SpO2:  [94 %-100 %] 100 % (10/19 1724)  Physical Examination: General:  Ill appearing , pale  Neuro:  Awake and alert moves all fours HEENT:  Dry mucous membranes Neck:  Supple no jugular venous distention no lymphadenopathy Cardiovascular:  Tachycardia normal S1-S2 no S3 or S4 no murmur Lungs:  Decreased breath sounds bilateral one quarter the way up Abdomen:  Tender and  hypoactive bowel sounds incision is dressed Musculoskeletal:  Full range of motion no joint deformity Skin:  Intact  Principal Problem:  *Abdominal abscess Active Problems:  Crohn's disease of small intestine with complication  Sepsis  Acute blood loss anemia  Pleural effusion, bilateral  Atelectasis   ASSESSMENT AND PLAN  PULMONARY No results found for this basename: PHART:5,PCO2:5,PCO2ART:5,PO2ART:5,HCO3:5,O2SAT:5 in the last 168 hours Ventilator Settings:   CXR:  Bilateral pleural effusions and atelectasis ETT:  None  A:  Hypoxemia due to  atelectasis and pleural effusions P:   Administer oxygen and monitor  may yet need drainage of pleural effusion  CARDIOVASCULAR No results found for this basename: TROPONINI:5,LATICACIDVEN:5, O2SATVEN:5,PROBNP:5 in the last 168 hours ECG:  Reviewed Lines: Peripheral IVs  A: History of hypertension but no evidence currently of septic shock P:  Monitor off hypertensive meds IV fluids  RENAL  Lab 02/06/12 1351 02/01/12 0343  NA 133* 137  K 3.1* 3.4*  CL 99 104  CO2 23 26  BUN 12 14  CREATININE 0.81 0.89  CALCIUM 8.1* 6.7*  MG -- --  PHOS -- --   Intake/Output    None    Foley: In place A:  No acute renal issues, hypokalemia P:   Replace potassium  GASTROINTESTINAL No results found for this basename: AST:5,ALT:5,ALKPHOS:5,BILITOT:5,PROT:5,ALBUMIN:5 in the last 168 hours  A:  History of Crohn's disease with perforation status post right hemicolectomy on 01/23/2012. Now with abdominal fluid collection below the anterior abdominal wall near the incision. Rule out abdominal abscess. Also has a small splenic infarct. P:   General surgery consultation and consider drainage of fluid  HEMATOLOGIC  Lab 02/06/12 1351  HGB 7.6*  HCT 21.9*  PLT 611*  INR --  APTT --   A:  Severe anemia due to acute blood loss in the past P:  Diffuse one unit Check PT PTT  INFECTIOUS  Lab 02/06/12 1351  WBC 15.1*  PROCALCITON --   Cultures: Blood cultures x2  02/06/2012 Antibiotics: Cefepime 02/06/2012 (abdominal sepsis) Vancomycin 02/06/2012(abdominal sepsis)  A:  Probable intra-abdominal source P:   Continue cefepime and vancomycin and consider drainage of abdominal fluid collection with interventional radiology versus reexploration per general surgery  ENDOCRINE No results found for this basename: GLUCAP:5 in the last 168 hours A:  No active endocrine issues   P:   Monitor  NEUROLOGIC  A:  No active neurologic issue P:   Monitor  BEST PRACTICE /  DISPOSITION Level of Care:  ICU Primary Service:  Critical care Consultants:  Surgery Code Status:  Full Diet:  NPO DVT Px:  Hep sq GI Px:  PPI IV Skin Integrity:  good Social / Family:  Updated at bedside  CC . Shan Levans, M.D. Pulmonary and Critical Care Medicine [facility]  [phone]  Cell  [phone]  If no response or cell goes to voicemail, call beeper [phone]   02/06/2012, 5:58 PM

## 2012-02-06 NOTE — Consult Note (Signed)
Reason for Consult:postop fever and abscess Referring Physician: Dr. Genene Churn Michele David is an 66 y.o. female.  HPI: patient is well known to our service discharged earlier this week. She is 2 weeks status post emergency laparotomy for perforated Crohn's disease at a previous ileocolonic anastomosis. She underwent resection of her anastomosis with new primary ileocolonic anastomosis. Her postoperative course was relatively uncomplicated. Her wound was left open and she was noted to have some necrotic fascia at the base of the wound when her VAC dressing was changed and she was switched to saline dressings. She was discharged to skilled nursing 3 days ago doing relatively well. She states that however over the past 2 days she has had recurrent fever and chills. She has not had any significant abdominal pain and states that her abdomen overall is feeling better. She has continued to have loose bowel movements as expected. She has been eating although her appetite has not been great. She has not had vomiting. She denies shortness of breath or cough or sputum production. No dysuria or frequency.she was brought to the emergency room this evening and CT scan was obtained showing an abscess beneath the abdominal wall as described below. CCM was kind enough to admit the patient and we were asked to see her.  Past Medical History  Diagnosis Date  . Bowel perforation   . Crohn disease   . Hypertension   . Hyperlipemia   . Swelling of extremity, left   . Swelling of extremity, right     Past Surgical History  Procedure Date  . Laparotomy 01/23/2012    Procedure: EXPLORATORY LAPAROTOMY;  Surgeon: Velora Heckler, MD;  Location: [facility];  Service: General;  Laterality: N/A;  lysis of adhesions resection ileocolonic anastamosos with perforation    History reviewed. No pertinent family history.  Social History:  reports that she has quit smoking. She has never used smokeless tobacco. She reports that  she does not drink alcohol or use illicit drugs.  Allergies:  Allergies  Allergen Reactions  . Tomato Other (See Comments)    Reaction: caused bumps in her mouth   Current Facility-Administered Medications  Medication Dose Route Frequency Provider Last Rate Last Dose  . 0.9 %  sodium chloride infusion   Intravenous Continuous Storm Frisk, MD      . acetaminophen (TYLENOL) suppository 650 mg  650 mg Rectal Once Raeford Razor, MD      . ceFEPIme (MAXIPIME) 1 g in dextrose 5 % 50 mL IVPB  1 g Intravenous Q8H Raeford Razor, MD 100 mL/hr at 02/06/12 1712 1 g at 02/06/12 1712  . fentaNYL (SUBLIMAZE) injection 25-50 mcg  25-50 mcg Intravenous Q2H PRN Storm Frisk, MD      . furosemide (LASIX) injection 20 mg  20 mg Intravenous Once Raeford Razor, MD      . heparin injection 5,000 Units  5,000 Units Subcutaneous Q8H Storm Frisk, MD      . iohexol (OMNIPAQUE) 300 MG/ML solution 100 mL  100 mL Intravenous Once PRN Raeford Razor, MD   100 mL at 02/06/12 1647  . levofloxacin (LEVAQUIN) IVPB 750 mg  750 mg Intravenous Q24H Raeford Razor, MD   750 mg at 02/06/12 1544  . ondansetron (ZOFRAN) injection 4 mg  4 mg Intravenous Once Raeford Razor, MD   4 mg at 02/06/12 1430  . pantoprazole (PROTONIX) injection 40 mg  40 mg Intravenous QHS Storm Frisk, MD      .  potassium chloride 10 mEq in 100 mL IVPB  10 mEq Intravenous Once Raeford Razor, MD   10 mEq at 02/06/12 1437  . potassium chloride 10 mEq in 100 mL IVPB  10 mEq Intravenous Q1 Hr x 3 Storm Frisk, MD      . sodium chloride 0.9 % bolus 1,000 mL  1,000 mL Intravenous Once Raeford Razor, MD   1,000 mL at 02/06/12 1422  . vancomycin (VANCOCIN) IVPB 1000 mg/200 mL premix  1,000 mg Intravenous Q12H Raeford Razor, MD          Results for orders placed during the hospital encounter of 02/06/12 (from the past 48 hour(s))  CBC     Status: Abnormal   Collection Time   02/06/12  1:51 PM      Component Value Range Comment   WBC 15.1  (*) 4.0 - 10.5 K/uL    RBC 2.63 (*) 3.87 - 5.11 MIL/uL    Hemoglobin 7.6 (*) 12.0 - 15.0 g/dL    HCT 16.1 (*) 09.6 - 46.0 %    MCV 83.3  78.0 - 100.0 fL    MCH 28.9  26.0 - 34.0 pg    MCHC 34.7  30.0 - 36.0 g/dL    RDW 04.5 (*) 40.9 - 15.5 %    Platelets 611 (*) 150 - 400 K/uL   BASIC METABOLIC PANEL     Status: Abnormal   Collection Time   02/06/12  1:51 PM      Component Value Range Comment   Sodium 133 (*) 135 - 145 mEq/L    Potassium 3.1 (*) 3.5 - 5.1 mEq/L    Chloride 99  96 - 112 mEq/L    CO2 23  19 - 32 mEq/L    Glucose, Bld 151 (*) 70 - 99 mg/dL    BUN 12  6 - 23 mg/dL    Creatinine, Ser 8.11  0.50 - 1.10 mg/dL    Calcium 8.1 (*) 8.4 - 10.5 mg/dL    GFR calc non Af Amer 73 (*) >90 mL/min    GFR calc Af Amer 85 (*) >90 mL/min   URINALYSIS, ROUTINE W REFLEX MICROSCOPIC     Status: Abnormal   Collection Time   02/06/12  2:15 PM      Component Value Range Comment   Color, Urine AMBER (*) YELLOW BIOCHEMICALS MAY BE AFFECTED BY COLOR   APPearance CLOUDY (*) CLEAR    Specific Gravity, Urine 1.027  1.005 - 1.030    pH 6.0  5.0 - 8.0    Glucose, UA NEGATIVE  NEGATIVE mg/dL    Hgb urine dipstick NEGATIVE  NEGATIVE    Bilirubin Urine SMALL (*) NEGATIVE    Ketones, ur NEGATIVE  NEGATIVE mg/dL    Protein, ur 914 (*) NEGATIVE mg/dL    Urobilinogen, UA 0.2  0.0 - 1.0 mg/dL    Nitrite NEGATIVE  NEGATIVE    Leukocytes, UA NEGATIVE  NEGATIVE   URINE MICROSCOPIC-ADD ON     Status: Abnormal   Collection Time   02/06/12  2:15 PM      Component Value Range Comment   Squamous Epithelial / LPF MANY (*) RARE    WBC, UA 0-2  <3 WBC/hpf    Bacteria, UA MANY (*) RARE   ABO/RH     Status: Normal   Collection Time   02/06/12  5:12 PM      Component Value Range Comment   ABO/RH(D) O POS     TYPE  AND SCREEN     Status: Normal (Preliminary result)   Collection Time   02/06/12  5:20 PM      Component Value Range Comment   ABO/RH(D) O POS      Antibody Screen NEG      Sample Expiration  02/09/2012      Unit Number [id number]      Blood Component Type RED CELLS,LR      Unit division 00      Status of Unit ALLOCATED      Transfusion Status OK TO TRANSFUSE      Crossmatch Result Compatible     LACTIC ACID, PLASMA     Status: Normal   Collection Time   02/06/12  5:22 PM      Component Value Range Comment   Lactic Acid, Venous 1.7  0.5 - 2.2 mmol/L   PREPARE RBC (CROSSMATCH)     Status: Normal   Collection Time   02/06/12  5:30 PM      Component Value Range Comment   Order Confirmation ORDER PROCESSED BY BLOOD BANK       Dg Chest 2 View  02/06/2012  *RADIOLOGY REPORT*  Clinical Data: Fever  CHEST - 2 VIEW  Comparison: 01/31/2012  Findings: Cardiomediastinal silhouette is stable.  There is bilateral small pleural effusions right greater than left with bilateral basilar atelectasis or infiltrate.  No pulmonary edema. Bony thorax is unremarkable.  IMPRESSION: Bilateral small pleural effusions right greater than left with bilateral basilar atelectasis or infiltrate.  No pulmonary edema.   Original Report Authenticated By: Natasha Mead, M.D.    Ct Abdomen Pelvis W Contrast  02/06/2012  *RADIOLOGY REPORT*  Clinical Data: Fever.  Evaluate for intra-abdominal source of infection.  CT ABDOMEN AND PELVIS WITH CONTRAST  Technique:  Multidetector CT imaging of the abdomen and pelvis was performed following the standard protocol during bolus administration of intravenous contrast.  Contrast: OMNIPAQUE IOHEXOL 300 MG/ML  SOLN  Comparison: 01/23/2011  Findings:  There are moderate to large bilateral pleural effusions which are increased in volume from previous exam.  There is moderate gallbladder wall edema, new from previous exam. No biliary ductal dilatation.  The pancreas appears within normal limits.  New wedge-shaped area of decreased attenuation within the periphery of the spleen is identified, image 15.  Suspicious for infarct.  The bilateral adrenal gland enlargement is stable  from previous exam.  The right kidney appears normal.  Nonobstructing stone within the left renal collecting system measures 2-3 mm, image 32. The urinary bladder appears normal.  The uterus appears normal. Cyst within the left ovary measures 2.4 x 2.4 cm, image 67. Unchanged from previous exam.  No adenopathy within the upper abdomen.  Small hiatal hernia.  There is abnormal wall thickening involving the first and second portions of the duodenum, suspicious for duodenitis.  This measures up to 8 mm.  The small bowel loops are mildly increased in caliber measuring up to 2.3 cm.  Previous right hemicolectomy with enterocolonic anastomoses.  There is extensive soft tissue inflammation and edema throughout the peritoneal cavity and mesenteric fat. Along the undersurface of the ventral abdominal wall there is a peripherally enhancing fluid collection containing a small foci of gas.  This fluid collection measures 9.4 x 12.0 x 6.4 cm and is concerning for abscess. Smaller fluid tiny fluid collection along the dome of the liver is noted measuring 2.6 x 0.5 cm.  Enteric contrast material is identified within the colon up to the  level of the splenic flexure.  Dilute contrast material is noted within the descending colon up to the rectum which may be from CT dated 01/23/2012.  Large ventral abdominal wall wound defect is again noted.  There is no fluid collections identified within the ventral abdominal wall.  Review of the visualized bony structures is unremarkable.  IMPRESSION:  1.  Fluid collection/abscess is identified along the undersurface of the ventral abdominal wall and may explain the source of fevers. Smaller fluid collection extends over the dome of the liver.  2.  There is extensive soft tissue inflammation and fat stranding throughout the peritoneal cavity. 3.  Status post right hemicolectomy with enterocolonic anastomoses. 4.  Abnormal gallbladder wall thickening.  Cannot rule out cholecystitis. 5.   Wedge-shaped area of low attenuation within the periphery of the spleen is concerning for infarct. 6.  Bilateral pleural effusions, increased from previous exam. 7.  Wall thickening involving the first and second portions of the duodenum concerning for duodenitis.  Critical Value/emergent results were called by telephone at the time of interpretation on 02/06/2012 at 5:14 p.m. to Dr. Juleen China, who verbally acknowledged these results.   Original Report Authenticated By: Rosealee Albee, M.D.     Review of Systems  Constitutional: Positive for fever and chills.  Respiratory: Negative for cough, sputum production and shortness of breath.   Cardiovascular: Positive for leg swelling. Negative for chest pain.  Gastrointestinal: Positive for nausea and diarrhea. Negative for vomiting, abdominal pain, constipation and blood in stool.  Genitourinary: Negative for dysuria, urgency and frequency.  Neurological: Positive for weakness.   Blood pressure 133/89, pulse 82, temperature 99.1 F (37.3 C), temperature source Oral, resp. rate 28, height 5\' 2"  (1.575 m), weight 166 lb 7.2 oz (75.5 kg), SpO2 100.00%. Physical Exam General: African American female who is alert and in no apparent distress Skin: Warm and dry without rash or infection HEENT: Sclera nonicteric. Oropharynx clear. Lungs: Clear equal breath sounds bilaterally Cardiovascular: Regular rate and rhythm. No murmurs. Abdomen: Bowel sounds are present. Her abdomen generally is soft with very minimal diffuse tenderness but no guarding or peritoneal signs. Nondistended. Her midline wound is packed open and the subcutaneous is clean with minimal exudate although there is some necrotic appearing fascia at the base of the wound. Extremities: 2+ bilateral lower extremity edema( chronic problem) Neurologic: She is alert and fully oriented and no gross motor deficits  Assessment/Plan: Fever and elevated white count 2 weeks following emergency ileocolectomy  for Crohn's disease. Her CT scan shows a loculated abscess beneath the anterior abdominal wall which is likely the source of her fever. She has no evidence of contrast leak to indicate an anastomotic leak and her abdomen is generally benign to exam. She has diarrhea as would be expected following this procedure and I doubt she has C. Difficile colitis but I will test for this. I have reviewed her CT with radiology and I would recommend proceeding with percutaneous drainage of her abdominal abscess. This is scheduled with interventional radiology tomorrow morning. She has been placed on broad-spectrum IV antibiotics and appears very stable. Appreciate CCM admitting the patient. She currently does not appear to have evidence of sepsis.  Emanuell Morina T 02/06/2012, 6:44 PM

## 2012-02-06 NOTE — ED Notes (Signed)
Pt comes from rehab following bowel resection after a bowel perforation in October of this year.  Pt states she has had a fever on and off for several days.  Pt has edema bilat in lower extremities, which she states is normal for her.  Pt states she has lymphedema in legs.  Pt has good peripheral pulses in all 4 extremities.  Pt's lung sounds are clear and bowel sounds are present.  Pt denies N/V/D and fever.  Pt has open midline abdominal incision that looks clean with no drainage under dressing.

## 2012-02-06 NOTE — ED Notes (Signed)
Per EMS - pt comes from [facility] with c/o fever.  Pt is at [facility] following abdominal surgery r/t Crohn's disease.  Pt has been at [facility] since Wednesday past.  Staff at facility report oral temp of 103.5 today.  Pt received Tylenol for fever, but staff did not recheck fever.  Urine and blood cultures were done at rehab this a.m.  Pt also received Rocephin IM at 8:30 this morning.

## 2012-02-06 NOTE — ED Provider Notes (Signed)
History    66 year old female with fever. Onset today. Patient with recent hospitalization after being admitted with a bowel perforation secondary to Crohn's disease. She underwent  bowel resection. Postoperatively she did fairly well and was discharged on the 16th. Was progressively getting better until today. She states that her abdominal pain has been improving since his surgery. Denies any acute change in this. Mild nausea but no vomiting. No diarrhea. No urinary complaints. Mild shortness of breath. No chest pain. Coughing which productive for whitish sputum. Lower extremity swelling. Per patient this is chronic in nature and denies acute change. Pt had blood/urine cultures done at rehab this morning and pt received does of IM rocephin.  CSN: [id number]  Arrival date & time 02/06/12  1258   First MD Initiated Contact with Patient 02/06/12 1311      Chief Complaint  Patient presents with  . Fever    (Consider location/radiation/quality/duration/timing/severity/associated sxs/prior treatment) HPI  Past Medical History  Diagnosis Date  . Bowel perforation   . Crohn disease   . Hypertension   . Hyperlipemia   . Swelling of extremity, left   . Swelling of extremity, right     Past Surgical History  Procedure Date  . Laparotomy 01/23/2012    Procedure: EXPLORATORY LAPAROTOMY;  Surgeon: Velora Heckler, MD;  Location: [facility];  Service: General;  Laterality: N/A;  lysis of adhesions resection ileocolonic anastamosos with perforation    No family history on file.  History  Substance Use Topics  . Smoking status: Former Games developer  . Smokeless tobacco: Never Used  . Alcohol Use: No    OB History    Grav Para Term Preterm Abortions TAB SAB Ect Mult Living                  Review of Systems   Review of symptoms negative unless otherwise noted in HPI.   Allergies  Tomato  Home Medications   Current Outpatient Rx  Name Route Sig Dispense Refill  . FUROSEMIDE 20 MG PO  TABS Oral Take 20 mg by mouth daily as needed. For fluid retention    . LOSARTAN POTASSIUM 50 MG PO TABS Oral Take 50 mg by mouth daily.    Marland Kitchen NAPROXEN SODIUM 220 MG PO TABS Oral Take 220 mg by mouth 2 (two) times daily as needed. For pain    . OXYCODONE-ACETAMINOPHEN 5-325 MG PO TABS Oral Take 1-2 tablets by mouth every 4 (four) hours as needed. 40 tablet 0  . VITAMIN B-12 1000 MCG PO TABS Oral Take 1,000 mcg by mouth daily.    Marland Kitchen VITAMIN E 400 UNITS PO CAPS Oral Take 400 Units by mouth daily.    . DENOSUMAB 60 MG/ML [place] SOLN Subcutaneous Inject 60 mg into the skin every 6 (six) months. Administer in upper arm, thigh, or abdomen      BP 135/53  Pulse 75  Temp 98.2 F (36.8 C) (Rectal)  Resp 24  SpO2 96%  Physical Exam  Nursing note and vitals reviewed. Constitutional: She appears well-developed and well-nourished. No distress.  HENT:  Head: Normocephalic and atraumatic.  Eyes: Conjunctivae normal are normal. Right eye exhibits no discharge. Left eye exhibits no discharge.  Neck: Neck supple.  Cardiovascular: Normal rate, regular rhythm and normal heart sounds.  Exam reveals no gallop and no friction rub.   No murmur heard. Pulmonary/Chest: Effort normal and breath sounds normal. No respiratory distress.  Abdominal: She exhibits no distension. There is no tenderness.  Midline wound with dressing in place. Removed. Healthy-appearing granulation tissue at base. No concerning drainage. Significant diffuse tenderness with voluntary guarding. No rebound. No distension.   Musculoskeletal: She exhibits no edema and no tenderness.       Lower extremity edema, right worse on left. No calf tenderness.  Neurological: She is alert.  Skin: Skin is warm and dry.  Psychiatric: She has a normal mood and affect. Her behavior is normal. Thought content normal.    ED Course  Procedures (including critical care time)  CRITICAL CARE Performed by: Raeford Razor   Total critical care time: 80  minutes  Critical care time was exclusive of separately billable procedures and treating other patients.  Critical care was necessary to treat or prevent imminent or life-threatening deterioration.  Critical care was time spent personally by me on the following activities: development of treatment plan with patient and/or surrogate as well as nursing, discussions with consultants, evaluation of patient's response to treatment, examination of patient, obtaining history from patient or surrogate, ordering and performing treatments and interventions, ordering and review of laboratory studies, ordering and review of radiographic studies, pulse oximetry and re-evaluation of patient's condition.   Labs Reviewed  CBC - Abnormal; Notable for the following:    WBC 15.1 (*)     RBC 2.63 (*)     Hemoglobin 7.6 (*)     HCT 21.9 (*)     RDW 16.1 (*)     Platelets 611 (*)     All other components within normal limits  BASIC METABOLIC PANEL - Abnormal; Notable for the following:    Sodium 133 (*)     Potassium 3.1 (*)     Glucose, Bld 151 (*)     Calcium 8.1 (*)     GFR calc non Af Amer 73 (*)     GFR calc Af Amer 85 (*)     All other components within normal limits  URINALYSIS, ROUTINE W REFLEX MICROSCOPIC - Abnormal; Notable for the following:    Color, Urine AMBER (*)  BIOCHEMICALS MAY BE AFFECTED BY COLOR   APPearance CLOUDY (*)     Bilirubin Urine SMALL (*)     Protein, ur 100 (*)     All other components within normal limits  URINE MICROSCOPIC-ADD ON - Abnormal; Notable for the following:    Squamous Epithelial / LPF MANY (*)     Bacteria, UA MANY (*)     All other components within normal limits  URINE CULTURE  TYPE AND SCREEN  PREPARE RBC (CROSSMATCH)  ABO/RH  LACTIC ACID, PLASMA   Dg Chest 2 View  02/06/2012  *RADIOLOGY REPORT*  Clinical Data: Fever  CHEST - 2 VIEW  Comparison: 01/31/2012  Findings: Cardiomediastinal silhouette is stable.  There is bilateral small pleural  effusions right greater than left with bilateral basilar atelectasis or infiltrate.  No pulmonary edema. Bony thorax is unremarkable.  IMPRESSION: Bilateral small pleural effusions right greater than left with bilateral basilar atelectasis or infiltrate.  No pulmonary edema.   Original Report Authenticated By: Natasha Mead, M.D.      1. Sepsis   2. Anemia   3. Abdominal fluid collection   4. Splenic infarct   5. Healthcare-associated pneumonia   6. Hypokalemia       MDM  66 year old female with fever. Chest x-ray with effusions and infiltrate vs atelectasis.Given respiratory symptoms though and recent hospitalization though will cover for possible healthcare associated pneumonia. Patient is diffusely tender on abdominal exam.  Not sure how much this is related to recent procedure versus possibly an intra-abdominal process causing symptoms so CT ordered. Pt denies abdominal pain but clearly tender and exam. She seems to try and minimize her discomfort. Pt anemic. Hemoglobin 6.7 from 11.0 on 01/26/12. Will transfuse. Does not appear to be anticoagulated per med list and discharge summary. CT subsequently showed multiple possible sources of infection intrabdominally. Previously ordered meds should cover. May benefit from addition of flagyl but will defer further abx selection to admitting providers.  Also possible splenic infarct on CT which may be real given thrombocytosis.  Pt's respiratory status has worsened just since being in ED. Mentating and oxygenation fine but increased WOB. Has pleural effusions. Bolused IVF initially and will be getting more with transfusion. BP ok so will gently diurese. Discussed with critical care for admit.         Raeford Razor, MD 02/06/12 1745

## 2012-02-06 NOTE — Progress Notes (Addendum)
ANTIBIOTIC CONSULT NOTE - INITIAL  Pharmacy Consult for Vancomycin, adjust abx for renal fxn. Indication: fever, possible HCAP  Allergies  Allergen Reactions  . Tomato Other (See Comments)    Reaction: caused bumps in her mouth    Patient Measurements:    Vital Signs: Temp: 98.2 F (36.8 C) (10/19 1432) Temp src: Rectal (10/19 1327) BP: 135/53 mmHg (10/19 1306) Pulse Rate: 75  (10/19 1306) Intake/Output from previous day:   Intake/Output from this shift:    Labs:  Basename 02/06/12 1351  WBC 15.1*  HGB 7.6*  PLT 611*  LABCREA --  CREATININE 0.81   The CrCl is unknown because both a height and weight (above a minimum accepted value) are required for this calculation. No results found for this basename: VANCOTROUGH:2,VANCOPEAK:2,VANCORANDOM:2,GENTTROUGH:2,GENTPEAK:2,GENTRANDOM:2,TOBRATROUGH:2,TOBRAPEAK:2,TOBRARND:2,AMIKACINPEAK:2,AMIKACINTROU:2,AMIKACIN:2, in the last 72 hours   Microbiology: Recent Results (from the past 720 hour(s))  MRSA PCR SCREENING     Status: Abnormal   Collection Time   01/24/12  2:57 AM      Component Value Range Status Comment   MRSA by PCR INVALID RESULTS, SPECIMEN SENT FOR CULTURE (*) NEGATIVE Final   MRSA CULTURE     Status: Normal   Collection Time   01/24/12  2:57 AM      Component Value Range Status Comment   Specimen Description NOSE   Final    Special Requests NONE   Final    Culture     Final    Value: FEW STAPHYLOCOCCUS AUREUS     Note: No MRSA Isolated   Report Status 01/26/2012 FINAL   Final     Medical History: Past Medical History  Diagnosis Date  . Bowel perforation   . Crohn disease   . Hypertension   . Hyperlipemia   . Swelling of extremity, left   . Swelling of extremity, right     Medications:   (Not in a hospital admission) Anti-infectives     Start     Dose/Rate Route Frequency Ordered Stop   02/06/12 1445   ceFEPIme (MAXIPIME) 1 g in dextrose 5 % 50 mL IVPB        1 g 100 mL/hr over 30 Minutes  Intravenous 3 times per day 02/06/12 1431 02/14/12 1359   02/06/12 1445   levofloxacin (LEVAQUIN) IVPB 750 mg        750 mg 100 mL/hr over 90 Minutes Intravenous Every 24 hours 02/06/12 1431 02/09/12 1444         Assessment:  66yo F admitted from SNF with fever on and off for a few days. Urine and blood cultures were obtained and Rocephin x 1 was given PTA.  Recently discharged after abdominal surgery to repair perforated viscus. Hx of Crohn's disease. Per RN report, open midline incision does not appear infected.   Starting empiric broad-spectrum abx for fever and suspected HCAP.  SCr wnl, CrCl ~76.  Weight on 01/25/12: 79kg.  Goal of Therapy:  Renally-appropriate dosing. Vancomycin trough level 15-20 mcg/ml  Plan:   Levaquin 750mg  IV q24h x 3 days.  Cefepime 1g IV q8h x 8 days.  Vancomycin 1g IV q12h x 8 days.  Measure Vanc trough at steady state.  Follow up renal fxn and culture results.  Charolotte Eke, PharmD, pager [phone]. 02/06/2012,2:44 PM.  Addendum:   Vanc and Cefepime for abdomnial abscess per RX- left doses the same.  Levaquin has not been d/c'd by MD Lorenza Evangelist 02/06/2012 6:57 PM

## 2012-02-06 NOTE — ED Notes (Signed)
Pt called out stating she felt like she had mucous in her throat.  Pt trying to cough mucous up.  Pt placed in upright position and felt better.  Pt unable to expectorate mucous.

## 2012-02-07 ENCOUNTER — Inpatient Hospital Stay (HOSPITAL_COMMUNITY): Payer: Medicare Other

## 2012-02-07 DIAGNOSIS — A419 Sepsis, unspecified organism: Secondary | ICD-10-CM

## 2012-02-07 DIAGNOSIS — D649 Anemia, unspecified: Secondary | ICD-10-CM

## 2012-02-07 DIAGNOSIS — K651 Peritoneal abscess: Secondary | ICD-10-CM

## 2012-02-07 LAB — URINE CULTURE

## 2012-02-07 LAB — CBC
Hemoglobin: 9.9 g/dL — ABNORMAL LOW (ref 12.0–15.0)
MCH: 28.3 pg (ref 26.0–34.0)
MCHC: 34 g/dL (ref 30.0–36.0)
MCHC: 34.1 g/dL (ref 30.0–36.0)
Platelets: 614 10*3/uL — ABNORMAL HIGH (ref 150–400)
RBC: 3.46 MIL/uL — ABNORMAL LOW (ref 3.87–5.11)
RDW: 15.5 % (ref 11.5–15.5)
WBC: 13.4 10*3/uL — ABNORMAL HIGH (ref 4.0–10.5)

## 2012-02-07 LAB — BASIC METABOLIC PANEL
Calcium: 7.7 mg/dL — ABNORMAL LOW (ref 8.4–10.5)
GFR calc Af Amer: 83 mL/min — ABNORMAL LOW (ref >90)
GFR calc non Af Amer: 71 mL/min — ABNORMAL LOW (ref >90)
Glucose, Bld: 119 mg/dL — ABNORMAL HIGH (ref 70–99)
Potassium: 3.6 mEq/L (ref 3.5–5.1)
Sodium: 134 mEq/L — ABNORMAL LOW (ref 135–145)

## 2012-02-07 LAB — TYPE AND SCREEN
ABO/RH(D): O POS
Antibody Screen: NEGATIVE
Unit division: 0

## 2012-02-07 LAB — PROTIME-INR
INR: 1.27 (ref 0.00–1.49)
Prothrombin Time: 15.6 seconds — ABNORMAL HIGH (ref 11.6–15.2)

## 2012-02-07 LAB — APTT: aPTT: 49 seconds — ABNORMAL HIGH (ref 24–37)

## 2012-02-07 LAB — PHOSPHORUS: Phosphorus: 2.4 mg/dL (ref 2.3–4.6)

## 2012-02-07 MED ORDER — MIDAZOLAM HCL 2 MG/2ML IJ SOLN
INTRAMUSCULAR | Status: AC | PRN
  Administered 2012-02-07: 2 mg via INTRAVENOUS

## 2012-02-07 MED ORDER — MAGNESIUM SULFATE 50 % IJ SOLN
1.0000 g | Freq: Once | INTRAVENOUS | Status: AC
  Administered 2012-02-07: 1 g via INTRAVENOUS
  Filled 2012-02-07: qty 2

## 2012-02-07 MED ORDER — FENTANYL CITRATE 0.05 MG/ML IJ SOLN
INTRAMUSCULAR | Status: AC | PRN
  Administered 2012-02-07: 100 ug via INTRAVENOUS

## 2012-02-07 MED ORDER — FENTANYL CITRATE 0.05 MG/ML IJ SOLN
INTRAMUSCULAR | Status: AC
  Filled 2012-02-07: qty 6

## 2012-02-07 MED ORDER — POTASSIUM CHLORIDE 10 MEQ/100ML IV SOLN
10.0000 meq | INTRAVENOUS | Status: AC
  Administered 2012-02-07 (×2): 10 meq via INTRAVENOUS
  Filled 2012-02-07 (×2): qty 100

## 2012-02-07 MED ORDER — BIOTENE DRY MOUTH MT LIQD
15.0000 mL | Freq: Two times a day (BID) | OROMUCOSAL | Status: DC
  Administered 2012-02-07 – 2012-03-07 (×38): 15 mL via OROMUCOSAL

## 2012-02-07 MED ORDER — MIDAZOLAM HCL 2 MG/2ML IJ SOLN
INTRAMUSCULAR | Status: AC
  Filled 2012-02-07: qty 6

## 2012-02-07 NOTE — Procedures (Signed)
Successful placement of a 10 French drain into the abscess/fluid collection within the anterior aspect of mid abdomen Approximately 5 mL of purulent fluid aspirated. Samples sent to lab as requested. No immediate post procedural complications.

## 2012-02-07 NOTE — H&P (Signed)
Name: Michele David MRN: [id number] DOB: May 16, 1945    LOS: 1  Referring Provider:  EDP Reason for Referral:  sepsis  PULMONARY / CRITICAL CARE MEDICINE  HPI:  67 year old black female admitted 10/5 for bowel perforation and subsequently underwent ex lap on 10/5 and had R hemicolectomy with anastomosis.  She had volume excess and received lasix. She was d/c to SNF on POD #11 10/16.   Pt returns today 10/19 with abdominal pain and resp distress. She is found with pleural effusion and fluid collection under ventral abdominal wall.  PCCM asked to admit with [facility] surgery to consult. PMHx only pos for 1. Crohns Dz.   2. HTN   3. Chronic lymphedema right lower extremity - ? Etiology   Brief patient description:  66 yo AAF with abdominal fluid collection and s/p crohns dz with perf and exlap 14 days ago.  Events Since Admission: Admit 02/06/2012  1:05 PM 02/07/12: 10 French drain into the abscess/fluid collection within the anterior aspect of mid abdomen . Approximately 5 mL of purulent fluid aspirated. Samples sent to lab as requested.    SUBJECTIVE/OVERNIGHT/INTERVAL HX  s/p IR drain today. Not on pressors. Not intubated  Vital Signs: Temp:  [98.1 F (36.7 C)-99.8 F (37.7 C)] 98.4 F (36.9 C) (10/20 1200) Pulse Rate:  [70-95] 82  (10/20 1400) Resp:  [10-38] 38  (10/20 1400) BP: (129-172)/(47-89) 160/58 mmHg (10/20 1400) SpO2:  [94 %-100 %] 99 % (10/20 1400) Weight:  [74.1 kg (163 lb 5.8 oz)-75.5 kg (166 lb 7.2 oz)] 74.1 kg (163 lb 5.8 oz) (10/20 0500)  Physical Examination: General:  Ill appearing , chronically  Neuro:  Awake and alert moves all fours HEENT:  Dry mucous membranes Neck:  Supple no jugular venous distention no lymphadenopathy Cardiovascular:  Tachycardia normal S1-S2 no S3 or S4 no murmur Lungs:  Decreased breath sounds bilateral one quarter the way up Abdomen:  Tender and hypoactive bowel sounds incision is dressed. Has drain + Musculoskeletal:  Full range  of motion no joint deformity Skin:  Intact  Principal Problem:  *Abdominal abscess Active Problems:  Crohn's disease of small intestine with complication  Sepsis  Acute blood loss anemia  Pleural effusion, bilateral  Atelectasis   ASSESSMENT AND PLAN  PULMONARY No results found for this basename: PHART:5,PCO2:5,PCO2ART:5,PO2ART:5,HCO3:5,O2SAT:5 in the last 168 hours Ventilator Settings:   CXR:  Bilateral pleural effusions and atelectasis ETT:  None  A:  Hypoxemia due to atelectasis and pleural effusions P:   Administer oxygen and monitor  may yet need drainage of pleural effusion; probably not  CARDIOVASCULAR  Lab 02/06/12 1722  TROPONINI --  LATICACIDVEN 1.7  PROBNP --   ECG:  Reviewed Lines: Peripheral IVs  A: History of hypertension but no evidence currently of septic shock P:  Monitor off hypertensive meds IV fluids  RENAL  Lab 02/07/12 0342 02/06/12 1351 02/01/12 0343  NA 134* 133* 137  K 3.6 3.1* --  CL 102 99 104  CO2 22 23 26   BUN 10 12 14   CREATININE 0.83 0.81 0.89  CALCIUM 7.7* 8.1* 6.7*  MG 1.8 -- --  PHOS 2.4 -- --   Intake/Output      10/19 0701 - 10/20 0700 10/20 0701 - 10/21 0700   I.V. (mL/kg) 900 (12.1) 900 (12.1)   Blood 412.5    IV Piggyback 510 200   Total Intake(mL/kg) 1822.5 (24.6) 1100 (14.8)   Urine (mL/kg/hr) 2210 (1.2) 300 (0.5)   Total Output 2210 300  Net -387.5 +800        Urine Occurrence 1 x    Stool Occurrence      Foley: In place A:  No acute renal issues, hypokalemia and hypomag P:   Replace mag  GASTROINTESTINAL No results found for this basename: AST:5,ALT:5,ALKPHOS:5,BILITOT:5,PROT:5,ALBUMIN:5 in the last 168 hours  A:  History of Crohn's disease with perforation status post right hemicolectomy on 01/23/2012. Now with abdominal fluid collection below the anterior abdominal wall near the incision. Rule out abdominal abscess. Also has a small splenic infarct. P:   General surgery consultation and  consider drainage of fluid  HEMATOLOGIC  Lab 02/07/12 0342 02/07/12 0006 02/06/12 1351  HGB 9.2* 9.9* 7.6*  HCT 27.0* 29.1* 21.9*  PLT 614* 680* 611*  INR -- 1.27 --  APTT -- 49* --   A:  Severe anemia due to acute blood loss in the past P:  - PRBC for hgb </= 6.9gm%    - exceptions are   -  if ACS susepcted/confirmed then transfuse for hgb </= 8.0gm%,  or    -  If septic shock first 24h and scvo2 < 70% then transfuse for hgb </= 9.0gm%   - active bleeding with hemodynamic instability, then transfuse regardless of hemoglobin value   At at all times try to transfuse 1 unit prbc as possible with exception of active hemorrhage   INFECTIOUS  Lab 02/07/12 0342 02/07/12 0006 02/06/12 1351  WBC 14.1* 13.4* 15.1*  PROCALCITON -- -- --   Cultures: Blood cultures x2  02/06/2012 Antibiotics: Cefepime 02/06/2012 (abdominal sepsis) Vancomycin 02/06/2012(abdominal sepsis)  A:  Probable intra-abdominal source P:   Continue cefepime and vancomycin and consider drainage of abdominal fluid collection with interventional radiology versus reexploration per general surgery Check PCT  ENDOCRINE No results found for this basename: GLUCAP:5 in the last 168 hours A:  No active endocrine issues   P:   Monitor  NEUROLOGIC  A:  No active neurologic issue P:   Monitor  BEST PRACTICE / DISPOSITION Level of Care:  ICU ->SDU 02/07/12 Primary Service:  Critical care. Wil  Ask [facility] to assumer primary service 02/08/12 Consultants:  Surgery Code Status:  Full Diet:  NPO DVT Px:  Hep sq GI Px:  PPI IV Skin Integrity:  good Social / Family:  Updated at bedside  CC . Kalman Shan, M.D. Pulmonary and Critical Care Medicine [facility]  [phone]  Cell  [phone]  If no response or cell goes to voicemail, call beeper [phone]   02/07/2012, 3:35 PM

## 2012-02-07 NOTE — Progress Notes (Signed)
Patient ID: Michele David, female   DOB: 1946-01-02, 66 y.o.   MRN: [id number]    Subjective: No complaints this morning. Denies abdominal pain.  Objective: Vital signs in last 24 hours: Temp:  [98.2 F (36.8 C)-100.8 F (38.2 C)] 98.5 F (36.9 C) (10/20 0400) Pulse Rate:  [71-95] 71  (10/20 0600) Resp:  [23-37] 25  (10/20 0600) BP: (133-172)/(47-89) 143/51 mmHg (10/20 0600) SpO2:  [94 %-100 %] 100 % (10/20 0600) Weight:  [163 lb 5.8 oz (74.1 kg)-166 lb 7.2 oz (75.5 kg)] 163 lb 5.8 oz (74.1 kg) (10/20 0500) Last BM Date: 02/05/12  Intake/Output from previous day: 10/19 0701 - 10/20 0700 In: 1822.5 [I.V.:900; Blood:412.5; IV Piggyback:510] Out: 2210 [Urine:2210] Intake/Output this shift:    General appearance: alert, cooperative and no distress GI: normal findings: soft, non-tender Incision/Wound: dressing clean and dry without unusual drainage  Lab Results:   Basename 02/07/12 0342 02/07/12 0006  WBC 14.1* 13.4*  HGB 9.2* 9.9*  HCT 27.0* 29.1*  PLT 614* 680*   BMET  Basename 02/07/12 0342 02/06/12 1351  NA 134* 133*  K 3.6 3.1*  CL 102 99  CO2 22 23  GLUCOSE 119* 151*  BUN 10 12  CREATININE 0.83 0.81  CALCIUM 7.7* 8.1*     Studies/Results: Dg Chest 2 View  02/06/2012  *RADIOLOGY REPORT*  Clinical Data: Fever  CHEST - 2 VIEW  Comparison: 01/31/2012  Findings: Cardiomediastinal silhouette is stable.  There is bilateral small pleural effusions right greater than left with bilateral basilar atelectasis or infiltrate.  No pulmonary edema. Bony thorax is unremarkable.  IMPRESSION: Bilateral small pleural effusions right greater than left with bilateral basilar atelectasis or infiltrate.  No pulmonary edema.   Original Report Authenticated By: Natasha Mead, M.D.    Ct Abdomen Pelvis W Contrast  02/06/2012  *RADIOLOGY REPORT*  Clinical Data: Fever.  Evaluate for intra-abdominal source of infection.  CT ABDOMEN AND PELVIS WITH CONTRAST  Technique:  Multidetector CT  imaging of the abdomen and pelvis was performed following the standard protocol during bolus administration of intravenous contrast.  Contrast: OMNIPAQUE IOHEXOL 300 MG/ML  SOLN  Comparison: 01/23/2011  Findings:  There are moderate to large bilateral pleural effusions which are increased in volume from previous exam.  There is moderate gallbladder wall edema, new from previous exam. No biliary ductal dilatation.  The pancreas appears within normal limits.  New wedge-shaped area of decreased attenuation within the periphery of the spleen is identified, image 15.  Suspicious for infarct.  The bilateral adrenal gland enlargement is stable from previous exam.  The right kidney appears normal.  Nonobstructing stone within the left renal collecting system measures 2-3 mm, image 32. The urinary bladder appears normal.  The uterus appears normal. Cyst within the left ovary measures 2.4 x 2.4 cm, image 67. Unchanged from previous exam.  No adenopathy within the upper abdomen.  Small hiatal hernia.  There is abnormal wall thickening involving the first and second portions of the duodenum, suspicious for duodenitis.  This measures up to 8 mm.  The small bowel loops are mildly increased in caliber measuring up to 2.3 cm.  Previous right hemicolectomy with enterocolonic anastomoses.  There is extensive soft tissue inflammation and edema throughout the peritoneal cavity and mesenteric fat. Along the undersurface of the ventral abdominal wall there is a peripherally enhancing fluid collection containing a small foci of gas.  This fluid collection measures 9.4 x 12.0 x 6.4 cm and is concerning for abscess.  Smaller fluid tiny fluid collection along the dome of the liver is noted measuring 2.6 x 0.5 cm.  Enteric contrast material is identified within the colon up to the level of the splenic flexure.  Dilute contrast material is noted within the descending colon up to the rectum which may be from CT dated 01/23/2012.  Large  ventral abdominal wall wound defect is again noted.  There is no fluid collections identified within the ventral abdominal wall.  Review of the visualized bony structures is unremarkable.  IMPRESSION:  1.  Fluid collection/abscess is identified along the undersurface of the ventral abdominal wall and may explain the source of fevers. Smaller fluid collection extends over the dome of the liver.  2.  There is extensive soft tissue inflammation and fat stranding throughout the peritoneal cavity. 3.  Status post right hemicolectomy with enterocolonic anastomoses. 4.  Abnormal gallbladder wall thickening.  Cannot rule out cholecystitis. 5.  Wedge-shaped area of low attenuation within the periphery of the spleen is concerning for infarct. 6.  Bilateral pleural effusions, increased from previous exam. 7.  Wall thickening involving the first and second portions of the duodenum concerning for duodenitis.  Critical Value/emergent results were called by telephone at the time of interpretation on 02/06/2012 at 5:14 p.m. to Dr. Juleen China, who verbally acknowledged these results.   Original Report Authenticated By: Rosealee Albee, M.D.    Portable Chest Xray In Am  02/07/2012  *RADIOLOGY REPORT*  Clinical Data: Atelectasis, pleural effusions  PORTABLE CHEST - 1 VIEW  Comparison: 02/06/2012; 01/31/2012; 01/26/2012  Findings: Grossly unchanged borderline enlarged cardiac silhouette and mediastinal contours.  Lung volumes remain persistently reduced.  No change to minimal increase in layering bilateral pleural effusions with slight differences possibly attributable to patient positioning.  Grossly unchanged perihilar and bibasilar opacities. Pulmonary venous congestion without frank evidence of edema.  No new focal airspace opacities.  No pneumothorax. Unchanged bones.  IMPRESSION: 1.  No change to minimal increase in layering bilateral effusions, right greater than left.  2. Pulmonary venous congestion without frank evidence of  edema.   Original Report Authenticated By: Waynard Reeds, M.D.     Anti-infectives: Anti-infectives     Start     Dose/Rate Route Frequency Ordered Stop   02/06/12 1800   vancomycin (VANCOCIN) IVPB 1000 mg/200 mL premix        1,000 mg 200 mL/hr over 60 Minutes Intravenous Every 12 hours 02/06/12 1448 02/14/12 1759   02/06/12 1445   ceFEPIme (MAXIPIME) 1 g in dextrose 5 % 50 mL IVPB        1 g 100 mL/hr over 30 Minutes Intravenous 3 times per day 02/06/12 1431 02/14/12 1359   02/06/12 1445   levofloxacin (LEVAQUIN) IVPB 750 mg        750 mg 100 mL/hr over 90 Minutes Intravenous Every 24 hours 02/06/12 1431 02/09/12 1444          Assessment/Plan: Status post ileocecectomy for Crohn's disease with perforation Abdominal abscess beneath incision. For percutaneous drainage this morning. Very stable/improved since admission.    LOS: 1 day    Bonetta Mostek T 02/07/2012

## 2012-02-07 NOTE — Progress Notes (Signed)
Patient ID: Michele David, female   DOB: April 11, 1946, 66 y.o.   MRN: [id number] Request received for CT guided abdominal abscess drainage on pt with hx of Chron's disease, s/p ileocecectomy and finding of anterior abdominal wall abscess beneath midline wound on recent CT. Imaging studies were reviewed by Dr. Grace Isaac. Additional PMH as below. Exam: Pt awake/alert but sl drowsy; chest - dim BS at bases; heart- RRR; abd- soft, few BS, clean dressing over midline wound, tender to palpation.    Filed Vitals:   02/07/12 0400 02/07/12 0500 02/07/12 0600 02/07/12 0800  BP: 144/56  143/51 129/68  Pulse: 84  71 76  Temp: 98.5 F (36.9 C)   98.1 F (36.7 C)  TempSrc: Oral   Oral  Resp: 23  25 30   Height:      Weight:  163 lb 5.8 oz (74.1 kg)    SpO2: 100%  100% 100%   Past Medical History  Diagnosis Date  . Bowel perforation   . Crohn disease   . Hypertension   . Hyperlipemia   . Swelling of extremity, left   . Swelling of extremity, right    Past Surgical History  Procedure Date  . Laparotomy 01/23/2012    Procedure: EXPLORATORY LAPAROTOMY;  Surgeon: Velora Heckler, MD;  Location: [facility];  Service: General;  Laterality: N/A;  lysis of adhesions resection ileocolonic anastamosos with perforation   Dg Chest 2 View  02/06/2012  *RADIOLOGY REPORT*  Clinical Data: Fever  CHEST - 2 VIEW  Comparison: 01/31/2012  Findings: Cardiomediastinal silhouette is stable.  There is bilateral small pleural effusions right greater than left with bilateral basilar atelectasis or infiltrate.  No pulmonary edema. Bony thorax is unremarkable.  IMPRESSION: Bilateral small pleural effusions right greater than left with bilateral basilar atelectasis or infiltrate.  No pulmonary edema.   Original Report Authenticated By: Natasha Mead, M.D.    Dg Chest 2 View  01/31/2012  *RADIOLOGY REPORT*  Clinical Data: Short of breath.  Weakness.  CHEST - 2 VIEW  Comparison: 01/26/2012.  Findings: Interval removal of nasogastric tube.   Colonic air fluid levels are present in the upper abdomen.  Elevation of the right hemidiaphragm.  Persistent right basilar atelectasis or infiltrate. Small left pleural effusion with blunting of the left costophrenic angle.  Retrocardiac density most compatible with atelectasis and effusion in combination.  Mediastinal contours unchanged.  IMPRESSION:  1.  Interval removal of nasogastric tube. 2.  Bilateral pleural effusions, greater on the right than left with basilar collapse / consolidation.   Original Report Authenticated By: Andreas Newport, M.D.    Dg Chest 2 View  01/26/2012  *RADIOLOGY REPORT*  Clinical Data: Crackles left lower lobe.  CHEST - 2 VIEW  Comparison: 01/23/2012  Findings: Bibasilar atelectasis or infiltrates, right greater than left, increased since prior study.  Suspect small right effusion. Heart is mildly enlarged.  NG tube is in the stomach.  IMPRESSION: Increasing bibasilar atelectasis or infiltrates, right greater than left.  Small right effusion.   Original Report Authenticated By: Cyndie Chime, M.D.    Dg Chest 2 View  01/23/2012  *RADIOLOGY REPORT*  Clinical Data: Abdominal pain.  History of Crohn disease.  Right upper abdomen and right lower chest pain.  CHEST - 2 VIEW  Comparison: 06/05/2011  Findings: Film is made with shallow lung inflation.  Heart size is accentuated by the shallow lung volumes.  Beneath the right hemidiaphragm, there is lucency on both the frontal and lateral views.  Although this may represent bowel loops beneath the hemidiaphragm, free intraperitoneal there is entirely excluded.  There is a right pleural effusion.  No pulmonary edema.  IMPRESSION: Findings are suspicious for free intraperitoneal air below the right hemidiaphragm.  Further evaluation with left lateral decubitus view of the abdomen or CT of the abdomen pelvis with contrast recommended.  Critical test results telephoned to Dr. Rosalia Hammers at the time of interpretation on date 01/23/2012 at time  5:24 p.m.   Original Report Authenticated By: Patterson Hammersmith, M.D.    Ct Abdomen Pelvis W Contrast  02/06/2012  *RADIOLOGY REPORT*  Clinical Data: Fever.  Evaluate for intra-abdominal source of infection.  CT ABDOMEN AND PELVIS WITH CONTRAST  Technique:  Multidetector CT imaging of the abdomen and pelvis was performed following the standard protocol during bolus administration of intravenous contrast.  Contrast: OMNIPAQUE IOHEXOL 300 MG/ML  SOLN  Comparison: 01/23/2011  Findings:  There are moderate to large bilateral pleural effusions which are increased in volume from previous exam.  There is moderate gallbladder wall edema, new from previous exam. No biliary ductal dilatation.  The pancreas appears within normal limits.  New wedge-shaped area of decreased attenuation within the periphery of the spleen is identified, image 15.  Suspicious for infarct.  The bilateral adrenal gland enlargement is stable from previous exam.  The right kidney appears normal.  Nonobstructing stone within the left renal collecting system measures 2-3 mm, image 32. The urinary bladder appears normal.  The uterus appears normal. Cyst within the left ovary measures 2.4 x 2.4 cm, image 67. Unchanged from previous exam.  No adenopathy within the upper abdomen.  Small hiatal hernia.  There is abnormal wall thickening involving the first and second portions of the duodenum, suspicious for duodenitis.  This measures up to 8 mm.  The small bowel loops are mildly increased in caliber measuring up to 2.3 cm.  Previous right hemicolectomy with enterocolonic anastomoses.  There is extensive soft tissue inflammation and edema throughout the peritoneal cavity and mesenteric fat. Along the undersurface of the ventral abdominal wall there is a peripherally enhancing fluid collection containing a small foci of gas.  This fluid collection measures 9.4 x 12.0 x 6.4 cm and is concerning for abscess. Smaller fluid tiny fluid collection along  the dome of the liver is noted measuring 2.6 x 0.5 cm.  Enteric contrast material is identified within the colon up to the level of the splenic flexure.  Dilute contrast material is noted within the descending colon up to the rectum which may be from CT dated 01/23/2012.  Large ventral abdominal wall wound defect is again noted.  There is no fluid collections identified within the ventral abdominal wall.  Review of the visualized bony structures is unremarkable.  IMPRESSION:  1.  Fluid collection/abscess is identified along the undersurface of the ventral abdominal wall and may explain the source of fevers. Smaller fluid collection extends over the dome of the liver.  2.  There is extensive soft tissue inflammation and fat stranding throughout the peritoneal cavity. 3.  Status post right hemicolectomy with enterocolonic anastomoses. 4.  Abnormal gallbladder wall thickening.  Cannot rule out cholecystitis. 5.  Wedge-shaped area of low attenuation within the periphery of the spleen is concerning for infarct. 6.  Bilateral pleural effusions, increased from previous exam. 7.  Wall thickening involving the first and second portions of the duodenum concerning for duodenitis.  Critical Value/emergent results were called by telephone at the time of interpretation on  02/06/2012 at 5:14 p.m. to Dr. Juleen China, who verbally acknowledged these results.   Original Report Authenticated By: Rosealee Albee, M.D.    Ct Abdomen Pelvis W Contrast  01/23/2012  *RADIOLOGY REPORT*  Clinical Data: Diffuse abdominal pain and weakness. Free intraperitoneal gas.  History of Crohn's disease.  CT ABDOMEN AND PELVIS WITH CONTRAST  Technique:  Multidetector CT imaging of the abdomen and pelvis was performed following the standard protocol during bolus administration of intravenous contrast.  Contrast: OMNIPAQUE IOHEXOL 300 MG/ML  SOLN  Comparison: 01/23/2012; 10/29/2008  Findings: Small right pleural effusion with passive atelectasis noted  in the right lower lobe.  There is a moderate amount of scattered free intraperitoneal gas primarily in the upper abdomen.  The most likely site for the perforation is an inflamed loop of distal ileum shown on image 35 of series 4, with loculated gas fluid along the superior margin of the bowel in this location favoring localized perforation.  This inflamed loop of small bowel is just proximal to the ileocolic anastomoses, and inflammatory findings extend proximally in the ileum from this point as well.  On image 34 of series 5, there is a suggestion of a small fistulous connection between two adjacent inflamed loops of distal ileum.  Fatty deposition in the proximal half of the colon wall is typical for inflammatory bowel disease.  Mild perihepatic ascites noted, with the liver otherwise unremarkable.  No portal venous gas observed.  There is reflux of contrast from the stomach into the esophagus and a small hiatal hernia.  A shelf-like appearance in the gastric body is probably related to peristalsis.  Spleen unremarkable.  Fullness of adrenal glands noted without overt adrenal mass.  Pancreas unremarkable.  There is a 2 mm nonobstructive left mid kidney calculus.  Kidneys otherwise unremarkable.  Gallbladder unremarkable.  A small amount of pelvic ascites is present.  Urinary bladder unremarkable.  IMPRESSION:  1. Moderate amount of abnormal free peritoneal gas and scattered ascites.  Suspected site of perforation is in the distal ileum. Distal ileal inflammation is most compatible with Crohn's disease. 2.  Possible fistula between to loops of distal ileum on image 34 of series 5.  3.  Small right pleural effusion. 4.  Nonobstructive left nephrolithiasis.   Original Report Authenticated By: Dellia Cloud, M.D.    Portable Chest Xray In Am  02/07/2012  *RADIOLOGY REPORT*  Clinical Data: Atelectasis, pleural effusions  PORTABLE CHEST - 1 VIEW  Comparison: 02/06/2012; 01/31/2012; 01/26/2012  Findings:  Grossly unchanged borderline enlarged cardiac silhouette and mediastinal contours.  Lung volumes remain persistently reduced.  No change to minimal increase in layering bilateral pleural effusions with slight differences possibly attributable to patient positioning.  Grossly unchanged perihilar and bibasilar opacities. Pulmonary venous congestion without frank evidence of edema.  No new focal airspace opacities.  No pneumothorax. Unchanged bones.  IMPRESSION: 1.  No change to minimal increase in layering bilateral effusions, right greater than left.  2. Pulmonary venous congestion without frank evidence of edema.   Original Report Authenticated By: Waynard Reeds, M.D.   Results for orders placed during the hospital encounter of 02/06/12  CBC      Component Value Range   WBC 15.1 (*) 4.0 - 10.5 K/uL   RBC 2.63 (*) 3.87 - 5.11 MIL/uL   Hemoglobin 7.6 (*) 12.0 - 15.0 g/dL   HCT 84.1 (*) 32.4 - 40.1 %   MCV 83.3  78.0 - 100.0 fL   MCH 28.9  26.0 - 34.0 pg   MCHC 34.7  30.0 - 36.0 g/dL   RDW 16.1 (*) 09.6 - 04.5 %   Platelets 611 (*) 150 - 400 K/uL  BASIC METABOLIC PANEL      Component Value Range   Sodium 133 (*) 135 - 145 mEq/L   Potassium 3.1 (*) 3.5 - 5.1 mEq/L   Chloride 99  96 - 112 mEq/L   CO2 23  19 - 32 mEq/L   Glucose, Bld 151 (*) 70 - 99 mg/dL   BUN 12  6 - 23 mg/dL   Creatinine, Ser 4.09  0.50 - 1.10 mg/dL   Calcium 8.1 (*) 8.4 - 10.5 mg/dL   GFR calc non Af Amer 73 (*) >90 mL/min   GFR calc Af Amer 85 (*) >90 mL/min  URINALYSIS, ROUTINE W REFLEX MICROSCOPIC      Component Value Range   Color, Urine AMBER (*) YELLOW   APPearance CLOUDY (*) CLEAR   Specific Gravity, Urine 1.027  1.005 - 1.030   pH 6.0  5.0 - 8.0   Glucose, UA NEGATIVE  NEGATIVE mg/dL   Hgb urine dipstick NEGATIVE  NEGATIVE   Bilirubin Urine SMALL (*) NEGATIVE   Ketones, ur NEGATIVE  NEGATIVE mg/dL   Protein, ur 811 (*) NEGATIVE mg/dL   Urobilinogen, UA 0.2  0.0 - 1.0 mg/dL   Nitrite NEGATIVE  NEGATIVE    Leukocytes, UA NEGATIVE  NEGATIVE  URINE MICROSCOPIC-ADD ON      Component Value Range   Squamous Epithelial / LPF MANY (*) RARE   WBC, UA 0-2  <3 WBC/hpf   Bacteria, UA MANY (*) RARE  TYPE AND SCREEN      Component Value Range   ABO/RH(D) O POS     Antibody Screen NEG     Sample Expiration 02/09/2012     Unit Number [id number]     Blood Component Type RED CELLS,LR     Unit division 00     Status of Unit ISSUED,FINAL     Transfusion Status OK TO TRANSFUSE     Crossmatch Result Compatible    PREPARE RBC (CROSSMATCH)      Component Value Range   Order Confirmation ORDER PROCESSED BY BLOOD BANK    ABO/RH      Component Value Range   ABO/RH(D) O POS    LACTIC ACID, PLASMA      Component Value Range   Lactic Acid, Venous 1.7  0.5 - 2.2 mmol/L  CBC      Component Value Range   WBC 14.1 (*) 4.0 - 10.5 K/uL   RBC 3.25 (*) 3.87 - 5.11 MIL/uL   Hemoglobin 9.2 (*) 12.0 - 15.0 g/dL   HCT 86.5 (*) 78.4 - 69.6 %   MCV 83.1  78.0 - 100.0 fL   MCH 28.3  26.0 - 34.0 pg   MCHC 34.1  30.0 - 36.0 g/dL   RDW 29.5  28.4 - 13.2 %   Platelets 614 (*) 150 - 400 K/uL  BASIC METABOLIC PANEL      Component Value Range   Sodium 134 (*) 135 - 145 mEq/L   Potassium 3.6  3.5 - 5.1 mEq/L   Chloride 102  96 - 112 mEq/L   CO2 22  19 - 32 mEq/L   Glucose, Bld 119 (*) 70 - 99 mg/dL   BUN 10  6 - 23 mg/dL   Creatinine, Ser 4.40  0.50 - 1.10 mg/dL   Calcium 7.7 (*) 8.4 -  10.5 mg/dL   GFR calc non Af Amer 71 (*) >90 mL/min   GFR calc Af Amer 83 (*) >90 mL/min  MAGNESIUM      Component Value Range   Magnesium 1.8  1.5 - 2.5 mg/dL  PHOSPHORUS      Component Value Range   Phosphorus 2.4  2.3 - 4.6 mg/dL  CBC      Component Value Range   WBC 13.4 (*) 4.0 - 10.5 K/uL   RBC 3.46 (*) 3.87 - 5.11 MIL/uL   Hemoglobin 9.9 (*) 12.0 - 15.0 g/dL   HCT 16.1 (*) 09.6 - 04.5 %   MCV 84.1  78.0 - 100.0 fL   MCH 28.6  26.0 - 34.0 pg   MCHC 34.0  30.0 - 36.0 g/dL   RDW 40.9  81.1 - 91.4 %   Platelets 680  (*) 150 - 400 K/uL  PROTIME-INR      Component Value Range   Prothrombin Time 15.6 (*) 11.6 - 15.2 seconds   INR 1.27  0.00 - 1.49  APTT      Component Value Range   aPTT 49 (*) 24 - 37 seconds  MRSA PCR SCREENING      Component Value Range   MRSA by PCR NEGATIVE  NEGATIVE   A/P: Pt with hx Chron's dz, s/p ileocecectomy; now with anterior abd wall abscess beneath midline wound. Plan is for CT guided drainage of abscess today. Details/risks of procedure d/w pt with their understanding and consent.

## 2012-02-08 DIAGNOSIS — D62 Acute posthemorrhagic anemia: Secondary | ICD-10-CM

## 2012-02-08 DIAGNOSIS — R188 Other ascites: Secondary | ICD-10-CM

## 2012-02-08 LAB — BASIC METABOLIC PANEL
BUN: 10 mg/dL (ref 6–23)
Calcium: 7.2 mg/dL — ABNORMAL LOW (ref 8.4–10.5)
Creatinine, Ser: 0.81 mg/dL (ref 0.50–1.10)
GFR calc Af Amer: 85 mL/min — ABNORMAL LOW (ref >90)
GFR calc non Af Amer: 73 mL/min — ABNORMAL LOW (ref >90)

## 2012-02-08 LAB — VANCOMYCIN, TROUGH: Vancomycin Tr: 17.5 ug/mL (ref 10.0–20.0)

## 2012-02-08 LAB — PHOSPHORUS: Phosphorus: 1.9 mg/dL — ABNORMAL LOW (ref 2.3–4.6)

## 2012-02-08 MED ORDER — FUROSEMIDE 10 MG/ML IJ SOLN
20.0000 mg | Freq: Two times a day (BID) | INTRAMUSCULAR | Status: AC
  Administered 2012-02-08 – 2012-02-09 (×4): 20 mg via INTRAVENOUS
  Filled 2012-02-08 (×4): qty 2

## 2012-02-08 MED ORDER — DEXTROSE 5 % IV SOLN
1.0000 g | Freq: Three times a day (TID) | INTRAVENOUS | Status: DC
  Filled 2012-02-08: qty 1

## 2012-02-08 MED ORDER — PIPERACILLIN-TAZOBACTAM 3.375 G IVPB
3.3750 g | Freq: Three times a day (TID) | INTRAVENOUS | Status: DC
  Administered 2012-02-08 – 2012-02-18 (×30): 3.375 g via INTRAVENOUS
  Filled 2012-02-08 (×33): qty 50

## 2012-02-08 MED ORDER — LEVOFLOXACIN IN D5W 750 MG/150ML IV SOLN
750.0000 mg | INTRAVENOUS | Status: DC
  Filled 2012-02-08: qty 150

## 2012-02-08 NOTE — Progress Notes (Signed)
ANTIBIOTIC CONSULT NOTE - FOLLOW UP  Pharmacy Consult for vancomycin Indication: pneumonia  Allergies  Allergen Reactions  . Tomato Other (See Comments)    Reaction: caused bumps in her mouth    Patient Measurements: Height: 5\' 2"  (157.5 cm) Weight: 163 lb 5.8 oz (74.1 kg) IBW/kg (Calculated) : 50.1  Adjusted Body Weight:   Vital Signs: Temp: 98.6 F (37 C) (10/21 0400) Temp src: Oral (10/21 0400) BP: 149/60 mmHg (10/21 0600) Pulse Rate: 75  (10/21 0600) Intake/Output from previous day: 10/20 0701 - 10/21 0700 In: 3152 [I.V.:2552; IV Piggyback:600] Out: 825 [Urine:825] Intake/Output from this shift: Total I/O In: 1100 [I.V.:1100] Out: 325 [Urine:325]  Labs:  [facility] 02/07/12 0342 02/07/12 0006 02/06/12 1351  WBC 14.1* 13.4* 15.1*  HGB 9.2* 9.9* 7.6*  PLT 614* 680* 611*  LABCREA -- -- --  CREATININE 0.83 -- 0.81   Estimated Creatinine Clearance: 62 ml/min (by C-G formula based on Cr of 0.83).  Basename 02/08/12 0325  VANCOTROUGH 17.5  VANCOPEAK --  VANCORANDOM --  GENTTROUGH --  GENTPEAK --  GENTRANDOM --  TOBRATROUGH --  TOBRAPEAK --  TOBRARND --  AMIKACINPEAK --  AMIKACINTROU --  AMIKACIN --     Microbiology: Recent Results (from the past 720 hour(s))  MRSA PCR SCREENING     Status: Abnormal   Collection Time   01/24/12  2:57 AM      Component Value Range Status Comment   MRSA by PCR INVALID RESULTS, SPECIMEN SENT FOR CULTURE (*) NEGATIVE Final   MRSA CULTURE     Status: Normal   Collection Time   01/24/12  2:57 AM      Component Value Range Status Comment   Specimen Description NOSE   Final    Special Requests NONE   Final    Culture     Final    Value: FEW STAPHYLOCOCCUS AUREUS     Note: No MRSA Isolated   Report Status 01/26/2012 FINAL   Final   URINE CULTURE     Status: Normal   Collection Time   02/06/12  2:15 PM      Component Value Range Status Comment   Specimen Description URINE, CLEAN CATCH   Final    Special Requests NONE    Final    Culture  Setup Time 02/06/2012 19:08   Final    Colony Count 20,OOO COLONIES/ML   Final    Culture     Final    Value: Multiple bacterial morphotypes present, none predominant. Suggest appropriate recollection if clinically indicated.   Report Status 02/07/2012 FINAL   Final   MRSA PCR SCREENING     Status: Normal   Collection Time   02/07/12 12:42 AM      Component Value Range Status Comment   MRSA by PCR NEGATIVE  NEGATIVE Final     Anti-infectives     Start     Dose/Rate Route Frequency Ordered Stop   02/06/12 1800   vancomycin (VANCOCIN) IVPB 1000 mg/200 mL premix        1,000 mg 200 mL/hr over 60 Minutes Intravenous Every 12 hours 02/06/12 1448 02/14/12 1759   02/06/12 1445   ceFEPIme (MAXIPIME) 1 g in dextrose 5 % 50 mL IVPB        1 g 100 mL/hr over 30 Minutes Intravenous 3 times per day 02/06/12 1431 02/14/12 1359   02/06/12 1445   levofloxacin (LEVAQUIN) IVPB 750 mg        750 mg 100  mL/hr over 90 Minutes Intravenous Every 24 hours 02/06/12 1431 02/09/12 1444          Assessment: Patient with vancomycin level at goal.  Goal of Therapy:  Vancomycin trough level 15-20 mcg/ml  Plan:  Measure antibiotic drug levels at steady state Follow up culture results continue current vancomycin dose.  Darlina Guys, Jacquenette Shone Crowford 02/08/2012,6:22 AM

## 2012-02-08 NOTE — Progress Notes (Signed)
Name: Michele David MRN: [id number] DOB: Mar 13, 1946    LOS: 2  Referring Provider:  EDP Reason for Referral:  sepsis  PULMONARY / CRITICAL CARE MEDICINE  HPI:  66 year old black female admitted 10/5 for bowel perforation and subsequently underwent ex lap on 10/5 and had R hemicolectomy with anastomosis.  She had volume excess and received lasix. She was d/c to SNF on POD #11 10/16.   Pt returns 10/19 with abdominal pain and resp distress. She is found with pleural effusion and fluid collection under ventral abdominal wall.  PCCM asked to admit with [facility] surgery to consult. PMHx only pos for 1. Crohns Dz.   2. HTN   3. Chronic lymphedema right lower extremity - ? Etiology   Brief patient description:  66 yo AAF with abdominal fluid collection and s/p crohns dz with perf and exlap 14 days ago.  Events Since Admission: Admit 02/06/2012  1:05 PM 02/07/12: 10 French drain into the abscess/fluid collection within the anterior aspect of mid abdomen . Approximately 5 mL of purulent fluid aspirated. Samples sent to lab as requested.   SUBJECTIVE/OVERNIGHT/INTERVAL HX  s/p IR drain 10-20. Not on pressors. Not intubated 10-21 nad  Vital Signs: Temp:  [98.4 F (36.9 C)-102.8 F (39.3 C)] 99.6 F (37.6 C) (10/21 0800) Pulse Rate:  [69-84] 79  (10/21 0800) Resp:  [10-42] 42  (10/21 0800) BP: (133-160)/(47-73) 142/56 mmHg (10/21 0800) SpO2:  [99 %-100 %] 99 % (10/21 0800)  Physical Examination: General:  Ill appearing , chronically  Neuro:  Awake and alert moves all fours HEENT:  Dry mucous membranes Neck:  Supple no jugular venous distention no lymphadenopathy Cardiovascular:  Tachycardia normal S1-S2 no S3 or S4 no murmur Lungs:  Decreased breath sounds bilateral one quarter the way up Abdomen:  Tender and hypoactive bowel sounds incision is dressed. Has drain + Musculoskeletal:  Full range of motion no joint deformity Skin:  Intact  Principal Problem:  *Abdominal  abscess Active Problems:  Crohn's disease of small intestine with complication  Sepsis  Acute blood loss anemia  Pleural effusion, bilateral  Atelectasis  Dg Chest 2 View  02/06/2012  *RADIOLOGY REPORT*  Clinical Data: Fever  CHEST - 2 VIEW  Comparison: 01/31/2012  Findings: Cardiomediastinal silhouette is stable.  There is bilateral small pleural effusions right greater than left with bilateral basilar atelectasis or infiltrate.  No pulmonary edema. Bony thorax is unremarkable.  IMPRESSION: Bilateral small pleural effusions right greater than left with bilateral basilar atelectasis or infiltrate.  No pulmonary edema.   Original Report Authenticated By: Natasha Mead, M.D.    Ct Guided Abscess Drain  02/07/2012  *RADIOLOGY REPORT*  Indication: Abdominal fluid collection  CT GUIDED ANTERIOR MID ABDOMINAL DRAINAGE CATHETER PLACEMENT  Comparison: CT abdomen pelvis - 02/06/2012; 01/23/2012  Medications: Fentanyl 100 mcg IV; Versed 2 mg IV  Total Moderate Sedation time: 26 minutes  Contrast: None  Complications: None immediate  Technique / Findings:  Informed written consent was obtained from the patient after a discussion of the risks, benefits and alternatives to treatment. The patient was placed supine and on the CT gantry and a pre procedural CT was performed re-demonstrating the known lenticular shape abscess/fluid collection within the mid aspect of the anterior abdomen, just deep to the open midline abdominal wound. The procedure was planned.   A timeout was performed prior to the initiation of the procedure.  The anterior aspect of the right mid hemiabdomen was prepped and draped in the usual sterile fashion.  The overlying soft tissues were anesthetized with 1% lidocaine with epinephrine.  Appropriate trajectory was planned with the use of a 22 gauge spinal needle. An 18 gauge trocar needle was advanced into the abscess/fluid collection and a short Amplatz super stiff wire was coiled within the  collection.   Appropriate positioning was confirmed with a limited CT scan.  The tract was serially dilated allowing placement of a 10 [place] all-purpose drainage catheter.  Appropriate positioning was confirmed with a limited postprocedural CT scan.  5 ml of purulent fluid was aspirated.  The tube was connected to a drainage bag and sutured in place.  A dressing was placed.  The patient tolerated the procedure well without immediate post procedural complication.  Impression:  Successful CT guided placement of a 10 [place] all purpose drain catheter into the anterior abdomen, just deep to the open midline wound with aspiration of 5 mL of purulent fluid.  Samples were sent to the laboratory as requested by the ordering clinical team.   Original Report Authenticated By: Waynard Reeds, M.D.    Ct Abdomen Pelvis W Contrast  02/06/2012  *RADIOLOGY REPORT*  Clinical Data: Fever.  Evaluate for intra-abdominal source of infection.  CT ABDOMEN AND PELVIS WITH CONTRAST  Technique:  Multidetector CT imaging of the abdomen and pelvis was performed following the standard protocol during bolus administration of intravenous contrast.  Contrast: OMNIPAQUE IOHEXOL 300 MG/ML  SOLN  Comparison: 01/23/2011  Findings:  There are moderate to large bilateral pleural effusions which are increased in volume from previous exam.  There is moderate gallbladder wall edema, new from previous exam. No biliary ductal dilatation.  The pancreas appears within normal limits.  New wedge-shaped area of decreased attenuation within the periphery of the spleen is identified, image 15.  Suspicious for infarct.  The bilateral adrenal gland enlargement is stable from previous exam.  The right kidney appears normal.  Nonobstructing stone within the left renal collecting system measures 2-3 mm, image 32. The urinary bladder appears normal.  The uterus appears normal. Cyst within the left ovary measures 2.4 x 2.4 cm, image 67. Unchanged from  previous exam.  No adenopathy within the upper abdomen.  Small hiatal hernia.  There is abnormal wall thickening involving the first and second portions of the duodenum, suspicious for duodenitis.  This measures up to 8 mm.  The small bowel loops are mildly increased in caliber measuring up to 2.3 cm.  Previous right hemicolectomy with enterocolonic anastomoses.  There is extensive soft tissue inflammation and edema throughout the peritoneal cavity and mesenteric fat. Along the undersurface of the ventral abdominal wall there is a peripherally enhancing fluid collection containing a small foci of gas.  This fluid collection measures 9.4 x 12.0 x 6.4 cm and is concerning for abscess. Smaller fluid tiny fluid collection along the dome of the liver is noted measuring 2.6 x 0.5 cm.  Enteric contrast material is identified within the colon up to the level of the splenic flexure.  Dilute contrast material is noted within the descending colon up to the rectum which may be from CT dated 01/23/2012.  Large ventral abdominal wall wound defect is again noted.  There is no fluid collections identified within the ventral abdominal wall.  Review of the visualized bony structures is unremarkable.  IMPRESSION:  1.  Fluid collection/abscess is identified along the undersurface of the ventral abdominal wall and may explain the source of fevers. Smaller fluid collection extends over the dome of the  liver.  2.  There is extensive soft tissue inflammation and fat stranding throughout the peritoneal cavity. 3.  Status post right hemicolectomy with enterocolonic anastomoses. 4.  Abnormal gallbladder wall thickening.  Cannot rule out cholecystitis. 5.  Wedge-shaped area of low attenuation within the periphery of the spleen is concerning for infarct. 6.  Bilateral pleural effusions, increased from previous exam. 7.  Wall thickening involving the first and second portions of the duodenum concerning for duodenitis.  Critical Value/emergent  results were called by telephone at the time of interpretation on 02/06/2012 at 5:14 p.m. to Dr. Juleen China, who verbally acknowledged these results.   Original Report Authenticated By: Rosealee Albee, M.D.    Portable Chest Xray In Am  02/07/2012  *RADIOLOGY REPORT*  Clinical Data: Atelectasis, pleural effusions  PORTABLE CHEST - 1 VIEW  Comparison: 02/06/2012; 01/31/2012; 01/26/2012  Findings: Grossly unchanged borderline enlarged cardiac silhouette and mediastinal contours.  Lung volumes remain persistently reduced.  No change to minimal increase in layering bilateral pleural effusions with slight differences possibly attributable to patient positioning.  Grossly unchanged perihilar and bibasilar opacities. Pulmonary venous congestion without frank evidence of edema.  No new focal airspace opacities.  No pneumothorax. Unchanged bones.  IMPRESSION: 1.  No change to minimal increase in layering bilateral effusions, right greater than left.  2. Pulmonary venous congestion without frank evidence of edema.   Original Report Authenticated By: Waynard Reeds, M.D.     ASSESSMENT AND PLAN  PULMONARY No results found for this basename: PHART:5,PCO2:5,PCO2ART:5,PO2ART:5,HCO3:5,O2SAT:5 in the last 168 hours Ventilator Settings:    ETT:  None  A:  Hypoxemia due to atelectasis and pleural effusions, edema likely P:   Administer oxygen and monitor  Diuresis for effusions, edema, follow, no role thora as of now Wean O2 as tolerated  CARDIOVASCULAR  Lab 02/06/12 1722  TROPONINI --  LATICACIDVEN 1.7  PROBNP --   ECG:  Reviewed Lines: Peripheral IVs  A: History of hypertension, normotension P:  Monitor off hypertensive meds IV fluids kvo  RENAL  Lab 02/07/12 0342 02/06/12 1351  NA 134* 133*  K 3.6 3.1*  CL 102 99  CO2 22 23  BUN 10 12  CREATININE 0.83 0.81  CALCIUM 7.7* 8.1*  MG 1.8 --  PHOS 2.4 --   Intake/Output      10/20 0701 - 10/21 0700 10/21 0701 - 10/22 0700   I.V. (mL/kg)  2652 (35.8) 200 (2.7)   Blood     IV Piggyback 600    Total Intake(mL/kg) 3252 (43.9) 200 (2.7)   Urine (mL/kg/hr) 825 (0.5)    Total Output 825    Net +2427 +200         Foley: In place A:  Edema, effusions, overlaoded hypokalemia and hypomag P:   Repleted  Mag, k, bmet needed today lasix  GASTROINTESTINAL No results found for this basename: AST:5,ALT:5,ALKPHOS:5,BILITOT:5,PROT:5,ALBUMIN:5 in the last 168 hours  A:  History of Crohn's disease with perforation status post right hemicolectomy on 01/23/2012. Now with abdominal fluid collection below the anterior abdominal wall near the incision. Rule out abdominal abscess. Also has a small splenic infarct. P:   General surgery consultation and consider drainage of fluid IR placed drain 10-20  HEMATOLOGIC  Lab 02/07/12 0342 02/07/12 0006 02/06/12 1351  HGB 9.2* 9.9* 7.6*  HCT 27.0* 29.1* 21.9*  PLT 614* 680* 611*  INR -- 1.27 --  APTT -- 49* --   A:  Severe anemia due to acute blood loss in  the past P:  - PRBC for hgb </= 6.9gm%    INFECTIOUS  Lab 02/08/12 0335 02/07/12 0342 02/07/12 0006 02/06/12 1351  WBC -- 14.1* 13.4* 15.1*  PROCALCITON 1.79 2.84 -- --   Cultures: Blood cultures x2  02/06/2012>> 10-20 abscess>> 10-20 UC>>mul species Antibiotics: Cefepime 02/06/2012 (abdominal sepsis) Vancomycin 02/06/2012(abdominal sepsis) 10-19 Levaquin>>  A:  Probable intra-abdominal source P:   Limited anaerobic coverage currently, change cefipime to zosyn No enterococcis as of yet, if none in am , dc vanc Dc levofloxacin, limited role double coverage  Lab 02/08/12 0335 02/07/12 0342  PROCALCITON 1.79 2.84    ENDOCRINE No results found for this basename: GLUCAP:5 in the last 168 hours A:  No active endocrine issues   P:   Monitor  NEUROLOGIC  A:  Pain control P:   Monitor with fent  BEST PRACTICE / DISPOSITION Level of Care:  ICU ->SDU 02/07/12 Primary Service:  Consultants:  Surgery Code Status:   Full Diet:  NPO DVT Px:  Hep sq GI Px:  PPI IV Skin Integrity:  good Social / Family:  Updated at bedside  Mcarthur Rossetti. Tyson Alias, MD, FACP Pgr: [phone] [facility]

## 2012-02-08 NOTE — Progress Notes (Signed)
Received report from [facility], RN and assumed care of pt. VSS sitting up in chair. 1745 Back in bed, 50 mcg fentanyl iv for c/o abdominal pain. 20ml cloudy golden drainage emptied from abdominal drain. Abdominal dressing CDI from 10:00am per surgeon.

## 2012-02-08 NOTE — Progress Notes (Signed)
Subjective: Pt see. Feeling a little better  Objective: Physical Exam: BP 150/55  Pulse 74  Temp 99.6 F (37.6 C) (Oral)  Resp 33  Ht 5\' 2"  (1.575 m)  Wt 163 lb 5.8 oz (74.1 kg)  BMI 29.88 kg/m2  SpO2 99% Drain intact, site clean Output slightly cloudy serous fluid No recorded output in flowsheet, but about 10-15cc in bag now.   Labs: CBC  Basename 02/07/12 0342 02/07/12 0006  WBC 14.1* 13.4*  HGB 9.2* 9.9*  HCT 27.0* 29.1*  PLT 614* 680*   BMET  Basename 02/08/12 1200 02/07/12 0342  NA 136 134*  K 3.3* 3.6  CL 104 102  CO2 21 22  GLUCOSE 97 119*  BUN 10 10  CREATININE 0.81 0.83  CALCIUM 7.2* 7.7*   LFT No results found for this basename: PROT,ALBUMIN,AST,ALT,ALKPHOS,BILITOT,BILIDIR,IBILI,LIPASE in the last 72 hours PT/INR  Basename 02/07/12 0006  LABPROT 15.6*  INR 1.27     Studies/Results: Dg Chest 2 View  02/06/2012  *RADIOLOGY REPORT*  Clinical Data: Fever  CHEST - 2 VIEW  Comparison: 01/31/2012  Findings: Cardiomediastinal silhouette is stable.  There is bilateral small pleural effusions right greater than left with bilateral basilar atelectasis or infiltrate.  No pulmonary edema. Bony thorax is unremarkable.  IMPRESSION: Bilateral small pleural effusions right greater than left with bilateral basilar atelectasis or infiltrate.  No pulmonary edema.   Original Report Authenticated By: Natasha Mead, M.D.    Ct Guided Abscess Drain  02/07/2012  *RADIOLOGY REPORT*  Indication: Abdominal fluid collection  CT GUIDED ANTERIOR MID ABDOMINAL DRAINAGE CATHETER PLACEMENT  Comparison: CT abdomen pelvis - 02/06/2012; 01/23/2012  Medications: Fentanyl 100 mcg IV; Versed 2 mg IV  Total Moderate Sedation time: 26 minutes  Contrast: None  Complications: None immediate  Technique / Findings:  Informed written consent was obtained from the patient after a discussion of the risks, benefits and alternatives to treatment. The patient was placed supine and on the CT gantry and a  pre procedural CT was performed re-demonstrating the known lenticular shape abscess/fluid collection within the mid aspect of the anterior abdomen, just deep to the open midline abdominal wound. The procedure was planned.   A timeout was performed prior to the initiation of the procedure.  The anterior aspect of the right mid hemiabdomen was prepped and draped in the usual sterile fashion.   The overlying soft tissues were anesthetized with 1% lidocaine with epinephrine.  Appropriate trajectory was planned with the use of a 22 gauge spinal needle. An 18 gauge trocar needle was advanced into the abscess/fluid collection and a short Amplatz super stiff wire was coiled within the collection.   Appropriate positioning was confirmed with a limited CT scan.  The tract was serially dilated allowing placement of a 10 [place] all-purpose drainage catheter.  Appropriate positioning was confirmed with a limited postprocedural CT scan.  5 ml of purulent fluid was aspirated.  The tube was connected to a drainage bag and sutured in place.  A dressing was placed.  The patient tolerated the procedure well without immediate post procedural complication.  Impression:  Successful CT guided placement of a 10 [place] all purpose drain catheter into the anterior abdomen, just deep to the open midline wound with aspiration of 5 mL of purulent fluid.  Samples were sent to the laboratory as requested by the ordering clinical team.   Original Report Authenticated By: Waynard Reeds, M.D.    Ct Abdomen Pelvis W Contrast  02/06/2012  *RADIOLOGY  REPORT*  Clinical Data: Fever.  Evaluate for intra-abdominal source of infection.  CT ABDOMEN AND PELVIS WITH CONTRAST  Technique:  Multidetector CT imaging of the abdomen and pelvis was performed following the standard protocol during bolus administration of intravenous contrast.  Contrast: OMNIPAQUE IOHEXOL 300 MG/ML  SOLN  Comparison: 01/23/2011  Findings:  There are moderate to large  bilateral pleural effusions which are increased in volume from previous exam.  There is moderate gallbladder wall edema, new from previous exam. No biliary ductal dilatation.  The pancreas appears within normal limits.  New wedge-shaped area of decreased attenuation within the periphery of the spleen is identified, image 15.  Suspicious for infarct.  The bilateral adrenal gland enlargement is stable from previous exam.  The right kidney appears normal.  Nonobstructing stone within the left renal collecting system measures 2-3 mm, image 32. The urinary bladder appears normal.  The uterus appears normal. Cyst within the left ovary measures 2.4 x 2.4 cm, image 67. Unchanged from previous exam.  No adenopathy within the upper abdomen.  Small hiatal hernia.  There is abnormal wall thickening involving the first and second portions of the duodenum, suspicious for duodenitis.  This measures up to 8 mm.  The small bowel loops are mildly increased in caliber measuring up to 2.3 cm.  Previous right hemicolectomy with enterocolonic anastomoses.  There is extensive soft tissue inflammation and edema throughout the peritoneal cavity and mesenteric fat. Along the undersurface of the ventral abdominal wall there is a peripherally enhancing fluid collection containing a small foci of gas.  This fluid collection measures 9.4 x 12.0 x 6.4 cm and is concerning for abscess. Smaller fluid tiny fluid collection along the dome of the liver is noted measuring 2.6 x 0.5 cm.  Enteric contrast material is identified within the colon up to the level of the splenic flexure.  Dilute contrast material is noted within the descending colon up to the rectum which may be from CT dated 01/23/2012.  Large ventral abdominal wall wound defect is again noted.  There is no fluid collections identified within the ventral abdominal wall.  Review of the visualized bony structures is unremarkable.  IMPRESSION:  1.  Fluid collection/abscess is identified along  the undersurface of the ventral abdominal wall and may explain the source of fevers. Smaller fluid collection extends over the dome of the liver.  2.  There is extensive soft tissue inflammation and fat stranding throughout the peritoneal cavity. 3.  Status post right hemicolectomy with enterocolonic anastomoses. 4.  Abnormal gallbladder wall thickening.  Cannot rule out cholecystitis. 5.  Wedge-shaped area of low attenuation within the periphery of the spleen is concerning for infarct. 6.  Bilateral pleural effusions, increased from previous exam. 7.  Wall thickening involving the first and second portions of the duodenum concerning for duodenitis.  Critical Value/emergent results were called by telephone at the time of interpretation on 02/06/2012 at 5:14 p.m. to Dr. Juleen China, who verbally acknowledged these results.   Original Report Authenticated By: Rosealee Albee, M.D.    Portable Chest Xray In Am  02/07/2012  *RADIOLOGY REPORT*  Clinical Data: Atelectasis, pleural effusions  PORTABLE CHEST - 1 VIEW  Comparison: 02/06/2012; 01/31/2012; 01/26/2012  Findings: Grossly unchanged borderline enlarged cardiac silhouette and mediastinal contours.  Lung volumes remain persistently reduced.  No change to minimal increase in layering bilateral pleural effusions with slight differences possibly attributable to patient positioning.  Grossly unchanged perihilar and bibasilar opacities. Pulmonary venous congestion without frank evidence  of edema.  No new focal airspace opacities.  No pneumothorax. Unchanged bones.  IMPRESSION: 1.  No change to minimal increase in layering bilateral effusions, right greater than left.  2. Pulmonary venous congestion without frank evidence of edema.   Original Report Authenticated By: Waynard Reeds, M.D.     Assessment/Plan: S/p perc drain of ant abd fluid collection Cont to follow    LOS: 2 days    Brayton El PA-C 02/08/2012 1:29 PM

## 2012-02-08 NOTE — Progress Notes (Signed)
Subjective: No complaints  Objective: Vital signs in last 24 hours: Temp:  [98.4 F (36.9 C)-102.8 F (39.3 C)] 99.6 F (37.6 C) (10/21 0800) Pulse Rate:  [69-84] 79  (10/21 0800) Resp:  [10-42] 42  (10/21 0800) BP: (133-160)/(47-73) 142/56 mmHg (10/21 0800) SpO2:  [99 %-100 %] 99 % (10/21 0800) Last BM Date: 02/05/12  Intake/Output from previous day: 10/20 0701 - 10/21 0700 In: 3252 [I.V.:2652; IV Piggyback:600] Out: 825 [Urine:825] Intake/Output this shift: Total I/O In: 200 [I.V.:200] Out: -   GI: soft with mild tenderness. midline wound clean. drain output serous  Lab Results:   Basename 02/07/12 0342 02/07/12 0006  WBC 14.1* 13.4*  HGB 9.2* 9.9*  HCT 27.0* 29.1*  PLT 614* 680*   BMET  Basename 02/07/12 0342 02/06/12 1351  NA 134* 133*  K 3.6 3.1*  CL 102 99  CO2 22 23  GLUCOSE 119* 151*  BUN 10 12  CREATININE 0.83 0.81  CALCIUM 7.7* 8.1*   PT/INR  Basename 02/07/12 0006  LABPROT 15.6*  INR 1.27   ABG No results found for this basename: PHART:2,PCO2:2,PO2:2,HCO3:2 in the last 72 hours  Studies/Results: Dg Chest 2 View  02/06/2012  *RADIOLOGY REPORT*  Clinical Data: Fever  CHEST - 2 VIEW  Comparison: 01/31/2012  Findings: Cardiomediastinal silhouette is stable.  There is bilateral small pleural effusions right greater than left with bilateral basilar atelectasis or infiltrate.  No pulmonary edema. Bony thorax is unremarkable.  IMPRESSION: Bilateral small pleural effusions right greater than left with bilateral basilar atelectasis or infiltrate.  No pulmonary edema.   Original Report Authenticated By: Natasha Mead, M.D.    Ct Guided Abscess Drain  02/07/2012  *RADIOLOGY REPORT*  Indication: Abdominal fluid collection  CT GUIDED ANTERIOR MID ABDOMINAL DRAINAGE CATHETER PLACEMENT  Comparison: CT abdomen pelvis - 02/06/2012; 01/23/2012  Medications: Fentanyl 100 mcg IV; Versed 2 mg IV  Total Moderate Sedation time: 26 minutes  Contrast: None   Complications: None immediate  Technique / Findings:  Informed written consent was obtained from the patient after a discussion of the risks, benefits and alternatives to treatment. The patient was placed supine and on the CT gantry and a pre procedural CT was performed re-demonstrating the known lenticular shape abscess/fluid collection within the mid aspect of the anterior abdomen, just deep to the open midline abdominal wound. The procedure was planned.   A timeout was performed prior to the initiation of the procedure.  The anterior aspect of the right mid hemiabdomen was prepped and draped in the usual sterile fashion.   The overlying soft tissues were anesthetized with 1% lidocaine with epinephrine.  Appropriate trajectory was planned with the use of a 22 gauge spinal needle. An 18 gauge trocar needle was advanced into the abscess/fluid collection and a short Amplatz super stiff wire was coiled within the collection.   Appropriate positioning was confirmed with a limited CT scan.  The tract was serially dilated allowing placement of a 10 [place] all-purpose drainage catheter.  Appropriate positioning was confirmed with a limited postprocedural CT scan.  5 ml of purulent fluid was aspirated.  The tube was connected to a drainage bag and sutured in place.  A dressing was placed.  The patient tolerated the procedure well without immediate post procedural complication.  Impression:  Successful CT guided placement of a 10 [place] all purpose drain catheter into the anterior abdomen, just deep to the open midline wound with aspiration of 5 mL of purulent fluid.  Samples were sent  to the laboratory as requested by the ordering clinical team.   Original Report Authenticated By: Waynard Reeds, M.D.    Ct Abdomen Pelvis W Contrast  02/06/2012  *RADIOLOGY REPORT*  Clinical Data: Fever.  Evaluate for intra-abdominal source of infection.  CT ABDOMEN AND PELVIS WITH CONTRAST  Technique:  Multidetector CT imaging of the  abdomen and pelvis was performed following the standard protocol during bolus administration of intravenous contrast.  Contrast: OMNIPAQUE IOHEXOL 300 MG/ML  SOLN  Comparison: 01/23/2011  Findings:  There are moderate to large bilateral pleural effusions which are increased in volume from previous exam.  There is moderate gallbladder wall edema, new from previous exam. No biliary ductal dilatation.  The pancreas appears within normal limits.  New wedge-shaped area of decreased attenuation within the periphery of the spleen is identified, image 15.  Suspicious for infarct.  The bilateral adrenal gland enlargement is stable from previous exam.  The right kidney appears normal.  Nonobstructing stone within the left renal collecting system measures 2-3 mm, image 32. The urinary bladder appears normal.  The uterus appears normal. Cyst within the left ovary measures 2.4 x 2.4 cm, image 67. Unchanged from previous exam.  No adenopathy within the upper abdomen.  Small hiatal hernia.  There is abnormal wall thickening involving the first and second portions of the duodenum, suspicious for duodenitis.  This measures up to 8 mm.  The small bowel loops are mildly increased in caliber measuring up to 2.3 cm.  Previous right hemicolectomy with enterocolonic anastomoses.  There is extensive soft tissue inflammation and edema throughout the peritoneal cavity and mesenteric fat. Along the undersurface of the ventral abdominal wall there is a peripherally enhancing fluid collection containing a small foci of gas.  This fluid collection measures 9.4 x 12.0 x 6.4 cm and is concerning for abscess. Smaller fluid tiny fluid collection along the dome of the liver is noted measuring 2.6 x 0.5 cm.  Enteric contrast material is identified within the colon up to the level of the splenic flexure.  Dilute contrast material is noted within the descending colon up to the rectum which may be from CT dated 01/23/2012.  Large ventral abdominal  wall wound defect is again noted.  There is no fluid collections identified within the ventral abdominal wall.  Review of the visualized bony structures is unremarkable.  IMPRESSION:  1.  Fluid collection/abscess is identified along the undersurface of the ventral abdominal wall and may explain the source of fevers. Smaller fluid collection extends over the dome of the liver.  2.  There is extensive soft tissue inflammation and fat stranding throughout the peritoneal cavity. 3.  Status post right hemicolectomy with enterocolonic anastomoses. 4.  Abnormal gallbladder wall thickening.  Cannot rule out cholecystitis. 5.  Wedge-shaped area of low attenuation within the periphery of the spleen is concerning for infarct. 6.  Bilateral pleural effusions, increased from previous exam. 7.  Wall thickening involving the first and second portions of the duodenum concerning for duodenitis.  Critical Value/emergent results were called by telephone at the time of interpretation on 02/06/2012 at 5:14 p.m. to Dr. Juleen China, who verbally acknowledged these results.   Original Report Authenticated By: Rosealee Albee, M.D.    Portable Chest Xray In Am  02/07/2012  *RADIOLOGY REPORT*  Clinical Data: Atelectasis, pleural effusions  PORTABLE CHEST - 1 VIEW  Comparison: 02/06/2012; 01/31/2012; 01/26/2012  Findings: Grossly unchanged borderline enlarged cardiac silhouette and mediastinal contours.  Lung volumes remain persistently  reduced.  No change to minimal increase in layering bilateral pleural effusions with slight differences possibly attributable to patient positioning.  Grossly unchanged perihilar and bibasilar opacities. Pulmonary venous congestion without frank evidence of edema.  No new focal airspace opacities.  No pneumothorax. Unchanged bones.  IMPRESSION: 1.  No change to minimal increase in layering bilateral effusions, right greater than left.  2. Pulmonary venous congestion without frank evidence of edema.   Original  Report Authenticated By: Waynard Reeds, M.D.     Anti-infectives: Anti-infectives     Start     Dose/Rate Route Frequency Ordered Stop   02/06/12 1800   vancomycin (VANCOCIN) IVPB 1000 mg/200 mL premix        1,000 mg 200 mL/hr over 60 Minutes Intravenous Every 12 hours 02/06/12 1448 02/14/12 1759   02/06/12 1445   ceFEPIme (MAXIPIME) 1 g in dextrose 5 % 50 mL IVPB        1 g 100 mL/hr over 30 Minutes Intravenous 3 times per day 02/06/12 1431 02/14/12 1359   02/06/12 1445   levofloxacin (LEVAQUIN) IVPB 750 mg        750 mg 100 mL/hr over 90 Minutes Intravenous Every 24 hours 02/06/12 1431 02/09/12 1444          Assessment/Plan: s/p * No surgery found * Advance diet. Start liquids Continue abx and drain PT consult  LOS: 2 days    TOTH III,PAUL S 02/08/2012

## 2012-02-08 NOTE — Progress Notes (Signed)
ANTIBIOTIC CONSULT NOTE - INITIAL  Pharmacy Consult for Zosyn Indication: Abdominal abscess  Allergies  Allergen Reactions  . Tomato Other (See Comments)    Reaction: caused bumps in her mouth    Patient Measurements: Height: 5\' 2"  (157.5 cm) Weight: 163 lb 5.8 oz (74.1 kg) IBW/kg (Calculated) : 50.1   Vital Signs: Temp: 99.6 F (37.6 C) (10/21 0800) Temp src: Oral (10/21 0800) BP: 142/56 mmHg (10/21 0800) Pulse Rate: 70  (10/21 0950) Intake/Output from previous day: 10/20 0701 - 10/21 0700 In: 3252 [I.V.:2652; IV Piggyback:600] Out: 825 [Urine:825] Intake/Output from this shift: Total I/O In: 200 [I.V.:200] Out: 250 [Urine:250]  Labs:  [name] 02/07/12 0342 02/07/12 0006 02/06/12 1351  WBC 14.1* 13.4* 15.1*  HGB 9.2* 9.9* 7.6*  PLT 614* 680* 611*  LABCREA -- -- --  CREATININE 0.83 -- 0.81   Estimated Creatinine Clearance: 62 ml/min (by C-G formula based on Cr of 0.83).  Basename 02/08/12 0325  VANCOTROUGH 17.5  VANCOPEAK --  VANCORANDOM --  GENTTROUGH --  GENTPEAK --  GENTRANDOM --  TOBRATROUGH --  TOBRAPEAK --  TOBRARND --  AMIKACINPEAK --  AMIKACINTROU --  AMIKACIN --     Medical History: Past Medical History  Diagnosis Date  . Bowel perforation   . Crohn disease   . Hypertension   . Hyperlipemia   . Swelling of extremity, left   . Swelling of extremity, right    Microbiology: 10/19 urine: 20,000 colonies/ml multiple morphotypes 10/20 abscess drain: ngtd 10/20: MRSA negative  Assessment: 48 YOF admitted from SNF with fever.  Pt recently discharged s/p ileocecectomy for Crohn's disease with perforation.  Found to have abdominal abscess beneath incision, perc drain placed 10/20.  Patient empirically started on vancomycin/cefepime/levaquin 10/19 for questionable HCAP as fever source, now switching therapy to Zosyn for empiric treatment for abdominal abscess.  CrCl>20 ml/min and renal function appears stable.  Goal of Therapy:  doses  adjusted per renal clearance  Plan:  Zosyn 3.375g IV q8h (4 hour infusion time). F/u cultures and renal function.  Michele David 02/08/2012,11:47 AM

## 2012-02-09 LAB — COMPREHENSIVE METABOLIC PANEL
ALT: 26 U/L (ref 0–35)
BUN: 10 mg/dL (ref 6–23)
CO2: 23 mEq/L (ref 19–32)
Calcium: 7.3 mg/dL — ABNORMAL LOW (ref 8.4–10.5)
Creatinine, Ser: 0.91 mg/dL (ref 0.50–1.10)
GFR calc Af Amer: 74 mL/min — ABNORMAL LOW (ref >90)
GFR calc non Af Amer: 64 mL/min — ABNORMAL LOW (ref >90)
Glucose, Bld: 144 mg/dL — ABNORMAL HIGH (ref 70–99)
Sodium: 137 mEq/L (ref 135–145)

## 2012-02-09 LAB — CBC WITH DIFFERENTIAL/PLATELET
Basophils Absolute: 0 10*3/uL (ref 0.0–0.1)
Basophils Relative: 0 % (ref 0–1)
Hemoglobin: 9.3 g/dL — ABNORMAL LOW (ref 12.0–15.0)
Lymphocytes Relative: 5 % — ABNORMAL LOW (ref 12–46)
MCHC: 33.6 g/dL (ref 30.0–36.0)
Monocytes Relative: 6 % (ref 3–12)
Neutro Abs: 10.5 10*3/uL — ABNORMAL HIGH (ref 1.7–7.7)
Neutrophils Relative %: 88 % — ABNORMAL HIGH (ref 43–77)
WBC: 11.9 10*3/uL — ABNORMAL HIGH (ref 4.0–10.5)

## 2012-02-09 MED ORDER — KCL IN DEXTROSE-NACL 20-5-0.9 MEQ/L-%-% IV SOLN
INTRAVENOUS | Status: DC
  Administered 2012-02-09 – 2012-02-15 (×2): 20 mL/h via INTRAVENOUS
  Administered 2012-02-17: 75 mL/h via INTRAVENOUS
  Administered 2012-02-18 – 2012-02-28 (×9): via INTRAVENOUS
  Administered 2012-02-28: 75 mL via INTRAVENOUS
  Administered 2012-02-29 – 2012-03-03 (×5): via INTRAVENOUS
  Filled 2012-02-09 (×37): qty 1000

## 2012-02-09 MED ORDER — POTASSIUM CHLORIDE CRYS ER 20 MEQ PO TBCR
40.0000 meq | EXTENDED_RELEASE_TABLET | ORAL | Status: AC
  Administered 2012-02-09 (×2): 40 meq via ORAL
  Filled 2012-02-09: qty 1
  Filled 2012-02-09: qty 3

## 2012-02-09 NOTE — Progress Notes (Signed)
Subjective: Pt awaiting transfer to floor; feeling better  Objective: Vital signs in last 24 hours: Temp:  [98.1 F (36.7 C)-99.4 F (37.4 C)] 99.1 F (37.3 C) (10/22 1600) Pulse Rate:  [67-88] 67  (10/22 1600) Resp:  [25-36] 27  (10/22 1600) BP: (134-156)/(48-57) 135/57 mmHg (10/22 1600) SpO2:  [98 %-100 %] 98 % (10/22 1600) Weight:  [160 lb 0.9 oz (72.6 kg)] 160 lb 0.9 oz (72.6 kg) (10/22 0400) Last BM Date: 02/08/12  Intake/Output from previous day: 10/21 0701 - 10/22 0700 In: 1287.5 [P.O.:880; I.V.:320; IV Piggyback:87.5] Out: 2870 [Urine:2850; Drains:20] Intake/Output this shift: Total I/O In: 97.5 [I.V.:60; IV Piggyback:37.5] Out: 450 [Urine:450] RT abd drain intact, output 20 cc's last 24 hrs, cx's- neg to date  Lab Results:   Basename 02/09/12 0345 02/07/12 0342  WBC 11.9* 14.1*  HGB 9.3* 9.2*  HCT 27.7* 27.0*  PLT 627* 614*   BMET  Basename 02/09/12 0345 02/08/12 1200  NA 137 136  K 2.8* 3.3*  CL 103 104  CO2 23 21  GLUCOSE 144* 97  BUN 10 10  CREATININE 0.91 0.81  CALCIUM 7.3* 7.2*   PT/INR  Basename 02/07/12 0006  LABPROT 15.6*  INR 1.27   ABG No results found for this basename: PHART:2,PCO2:2,PO2:2,HCO3:2 in the last 72 hours Results for orders placed during the hospital encounter of 02/06/12  URINE CULTURE     Status: Normal   Collection Time   02/06/12  2:15 PM      Component Value Range Status Comment   Specimen Description URINE, CLEAN CATCH   Final    Special Requests NONE   Final    Culture  Setup Time 02/06/2012 19:08   Final    Colony Count 20,OOO COLONIES/ML   Final    Culture     Final    Value: Multiple bacterial morphotypes present, none predominant. Suggest appropriate recollection if clinically indicated.   Report Status 02/07/2012 FINAL   Final   MRSA PCR SCREENING     Status: Normal   Collection Time   02/07/12 12:42 AM      Component Value Range Status Comment   MRSA by PCR NEGATIVE  NEGATIVE Final   CULTURE,  ROUTINE-ABSCESS     Status: Normal (Preliminary result)   Collection Time   02/07/12 10:22 AM      Component Value Range Status Comment   Specimen Description DRAINAGE   Final    Special Requests Normal   Final    Gram Stain     Final    Value: ABUNDANT WBC PRESENT, PREDOMINANTLY PMN     NO SQUAMOUS EPITHELIAL CELLS SEEN     NO ORGANISMS SEEN   Culture NO GROWTH 2 DAYS   Final    Report Status PENDING   Incomplete     Studies/Results: No results found.  Anti-infectives: Anti-infectives     Start     Dose/Rate Route Frequency Ordered Stop   02/08/12 1400   ceFEPIme (MAXIPIME) 1 g in dextrose 5 % 50 mL IVPB  Status:  Discontinued        1 g 100 mL/hr over 30 Minutes Intravenous 3 times per day 02/08/12 1137 02/08/12 1141   02/08/12 1400  piperacillin-tazobactam (ZOSYN) IVPB 3.375 g       3.375 g 12.5 mL/hr over 240 Minutes Intravenous Every 8 hours 02/08/12 1145     02/08/12 1300   levofloxacin (LEVAQUIN) IVPB 750 mg  Status:  Discontinued  750 mg 100 mL/hr over 90 Minutes Intravenous Every 24 hours 02/08/12 1137 02/08/12 1141   02/06/12 1800   vancomycin (VANCOCIN) IVPB 1000 mg/200 mL premix  Status:  Discontinued        1,000 mg 200 mL/hr over 60 Minutes Intravenous Every 12 hours 02/06/12 1448 02/08/12 1133   02/06/12 1445   ceFEPIme (MAXIPIME) 1 g in dextrose 5 % 50 mL IVPB  Status:  Discontinued        1 g 100 mL/hr over 30 Minutes Intravenous 3 times per day 02/06/12 1431 02/08/12 1137   02/06/12 1445   levofloxacin (LEVAQUIN) IVPB 750 mg  Status:  Discontinued        750 mg 100 mL/hr over 90 Minutes Intravenous Every 24 hours 02/06/12 1431 02/08/12 1137          Assessment/Plan: s/p abd fluid coll drainage 10/20; check final cx's; replace K; check f/u CT later this week if output remains low  LOS: 3 days    Michele David,D [facility] 02/09/2012

## 2012-02-09 NOTE — Progress Notes (Signed)
eLink Physician-Brief Progress Note Patient Name: Michele David DOB: Jul 23, 1945 MRN: [id number]  Date of Service  02/09/2012   HPI/Events of Note   hypokalemia  eICU Interventions  Potassium replaced   Intervention Category Intermediate Interventions: Electrolyte abnormality - evaluation and management  Michele David 02/09/2012, 4:29 AM

## 2012-02-09 NOTE — Clinical Social Work Note (Addendum)
CSW received notice on rounds that Pt is from a SNF. CSW reviewed PT note as well. CSW found she is from [facility] and she can return per facility. CSW to confirm with Pt if she wants to return or a new SNF search. CSW to assess in the am.   Doreen Salvage, LCSW ICU/Stepdown Clinical Social Worker [facility] Cell [phone] Hours 8am-1200pm M-F

## 2012-02-09 NOTE — Evaluation (Signed)
Physical Therapy Evaluation Patient Details Name: Michele David MRN: [id number] DOB: 11-27-1945 Today's Date: 02/09/2012 Time: 4098-1191 PT Time Calculation (min): 26 min  PT Assessment / Plan / Recommendation Clinical Impression  Pt. was admitted with temp and abdominal pain  from SNF rehab after 2 days there.   Pt found with Pleural effusion and and fluid in abd. wall. s/p drain. 10/20. Pt. tolerated mobility OOB. Pt states whe will have assistance at home but uncerain if she will return to SNF. Pt. will require 24/7 asssitance. Pt/ will benefit fromPT while on acuted.    PT Assessment  Patient needs continued PT services    Follow Up Recommendations  Home health PT;Post acute inpatient (pt undecided. ? 24/7 assistance.)    Does the patient have the potential to tolerate intense rehabilitation   No, Recommend SNF  Barriers to Discharge Decreased caregiver support      Equipment Recommendations  3 in 1 bedside comode    Recommendations for Other Services     Frequency Min 3X/week    Precautions / Restrictions Precautions Precaution Comments: abd. drain on R   Pertinent Vitals/Pain  < 4/ 10 abdomen      Mobility  Bed Mobility Supine to Sit: 5: Supervision;With rails;HOB elevated Sitting - Scoot to Edge of Bed: 5: Supervision Details for Bed Mobility Assistance: VC to flex knees anf roll to protedct abdominal surgergical site Transfers Transfers: Stand Pivot Transfers Sit to Stand: With upper extremity assist;4: Min assist;From bed;With armrests;From chair/3-in-1 Stand to Sit: To chair/3-in-1;With armrests;With upper extremity assist;4: Min assist Stand Pivot Transfers: 4: Min assist Details for Transfer Assistance: pt moved well, took her time. Pt pivoted from recliner to [facility] and back with min guard. Ambulation/Gait Ambulation/Gait Assistance: 4: Min assist Ambulation Distance (Feet): 5 Feet Assistive device: 1 person hand held assist Ambulation/Gait  Assistance Details: pt did not want to ambulate into hall at this time. Gait Pattern: Step-through pattern    Shoulder Instructions     Exercises     PT Diagnosis: Difficulty walking;Generalized weakness;Acute pain  PT Problem List: Decreased strength;Decreased activity tolerance;Decreased mobility;Decreased safety awareness;Decreased knowledge of precautions;Pain PT Treatment Interventions: DME instruction;Gait training;Functional mobility training;Therapeutic activities;Therapeutic exercise;Patient/family education   PT Goals Acute Rehab PT Goals PT Goal Formulation: With patient/family Time For Goal Achievement: 02/23/12 Potential to Achieve Goals: Good Pt will go Supine/Side to Sit: Independently PT Goal: Supine/Side to Sit - Progress: Goal set today Pt will go Sit to Supine/Side: Independently PT Goal: Sit to Supine/Side - Progress: Goal set today Pt will go Sit to Stand: with modified independence PT Goal: Sit to Stand - Progress: Goal set today Pt will go Stand to Sit: with modified independence PT Goal: Stand to Sit - Progress: Goal set today Pt will Ambulate: 51 - 150 feet;with modified independence;with least restrictive assistive device PT Goal: Ambulate - Progress: Goal set today  Visit Information  Last PT Received On: 02/09/12 Assistance Needed: +1    Subjective Data  Subjective: I just want to take it slow Patient Stated Goal:  I am not sure if I will go home or to rehab.   Prior Functioning  Home Living Lives With: Alone Available Help at Discharge: Family;Personal care attendant;Available PRN/intermittently Type of Home: House Home Access: Stairs to enter Entrance Stairs-Number of Steps: 1 Home Layout: Two level;Able to live on main level with bedroom/bathroom Alternate Level Stairs-Number of Steps: flight Alternate Level Stairs-Rails: Right Bathroom Shower/Tub: Health visitor: Standard Home Adaptive Equipment: None  Cognition   Overall Cognitive Status: Appears within functional limits for tasks assessed/performed Arousal/Alertness: Awake/alert Orientation Level: Appears intact for tasks assessed Behavior During Session: [facility] for tasks performed    Extremity/Trunk Assessment Right Upper Extremity Assessment RUE ROM/Strength/Tone: [employer] for tasks assessed Left Upper Extremity Assessment LUE ROM/Strength/Tone: WFL for tasks assessed Right Lower Extremity Assessment RLE ROM/Strength/Tone: [facility] for tasks assessed Left Lower Extremity Assessment LLE ROM/Strength/Tone: WFL for tasks assessed LLE ROM/Strength/Tone Deficits: edema in both legs :lymphadema" per pt.   Balance    End of Session PT - End of Session Activity Tolerance: Patient tolerated treatment well Patient left: in chair;with call bell/phone within reach;with family/visitor present Nurse Communication: Mobility status  GP     Rada Hay 02/09/2012, 11:39 AM  [phone]

## 2012-02-09 NOTE — Progress Notes (Signed)
Subjective: No complaints but she thinks she overdid her activity yesterday. Having flatus and bm's  Objective: Vital signs in last 24 hours: Temp:  [99.2 F (37.3 C)-100 F (37.8 C)] 99.4 F (37.4 C) (10/22 0400) Pulse Rate:  [70-88] 88  (10/22 0800) Resp:  [25-36] 25  (10/22 0800) BP: (148-158)/(47-55) 156/53 mmHg (10/22 0800) SpO2:  [99 %-100 %] 100 % (10/22 0800) Weight:  [160 lb 0.9 oz (72.6 kg)] 160 lb 0.9 oz (72.6 kg) (10/22 0400) Last BM Date: 02/05/12  Intake/Output from previous day: 10/21 0701 - 10/22 0700 In: 1287.5 [P.O.:880; I.V.:320; IV Piggyback:87.5] Out: 2870 [Urine:2850; Drains:20] Intake/Output this shift: Total I/O In: 12.5 [IV Piggyback:12.5] Out: -   GI: soft, minimal tenderness. wound clean  Lab Results:   Basename 02/09/12 0345 02/07/12 0342  WBC 11.9* 14.1*  HGB 9.3* 9.2*  HCT 27.7* 27.0*  PLT 627* 614*   BMET  Basename 02/09/12 0345 02/08/12 1200  NA 137 136  K 2.8* 3.3*  CL 103 104  CO2 23 21  GLUCOSE 144* 97  BUN 10 10  CREATININE 0.91 0.81  CALCIUM 7.3* 7.2*   PT/INR  Basename 02/07/12 0006  LABPROT 15.6*  INR 1.27   ABG No results found for this basename: PHART:2,PCO2:2,PO2:2,HCO3:2 in the last 72 hours  Studies/Results: Ct Guided Abscess Drain  02/07/2012  *RADIOLOGY REPORT*  Indication: Abdominal fluid collection  CT GUIDED ANTERIOR MID ABDOMINAL DRAINAGE CATHETER PLACEMENT  Comparison: CT abdomen pelvis - 02/06/2012; 01/23/2012  Medications: Fentanyl 100 mcg IV; Versed 2 mg IV  Total Moderate Sedation time: 26 minutes  Contrast: None  Complications: None immediate  Technique / Findings:  Informed written consent was obtained from the patient after a discussion of the risks, benefits and alternatives to treatment. The patient was placed supine and on the CT gantry and a pre procedural CT was performed re-demonstrating the known lenticular shape abscess/fluid collection within the mid aspect of the anterior abdomen, just  deep to the open midline abdominal wound. The procedure was planned.   A timeout was performed prior to the initiation of the procedure.  The anterior aspect of the right mid hemiabdomen was prepped and draped in the usual sterile fashion.   The overlying soft tissues were anesthetized with 1% lidocaine with epinephrine.  Appropriate trajectory was planned with the use of a 22 gauge spinal needle. An 18 gauge trocar needle was advanced into the abscess/fluid collection and a short Amplatz super stiff wire was coiled within the collection.   Appropriate positioning was confirmed with a limited CT scan.  The tract was serially dilated allowing placement of a 10 [place] all-purpose drainage catheter.  Appropriate positioning was confirmed with a limited postprocedural CT scan.  5 ml of purulent fluid was aspirated.  The tube was connected to a drainage bag and sutured in place.  A dressing was placed.  The patient tolerated the procedure well without immediate post procedural complication.  Impression:  Successful CT guided placement of a 10 [place] all purpose drain catheter into the anterior abdomen, just deep to the open midline wound with aspiration of 5 mL of purulent fluid.  Samples were sent to the laboratory as requested by the ordering clinical team.   Original Report Authenticated By: Waynard Reeds, M.D.     Anti-infectives: Anti-infectives     Start     Dose/Rate Route Frequency Ordered Stop   02/08/12 1400   ceFEPIme (MAXIPIME) 1 g in dextrose 5 % 50 mL IVPB  Status:  Discontinued        1 g 100 mL/hr over 30 Minutes Intravenous 3 times per day 02/08/12 1137 02/08/12 1141   02/08/12 1400   piperacillin-tazobactam (ZOSYN) IVPB 3.375 g        3.375 g 12.5 mL/hr over 240 Minutes Intravenous Every 8 hours 02/08/12 1145     02/08/12 1300   levofloxacin (LEVAQUIN) IVPB 750 mg  Status:  Discontinued        750 mg 100 mL/hr over 90 Minutes Intravenous Every 24 hours 02/08/12 1137 02/08/12 1141    02/06/12 1800   vancomycin (VANCOCIN) IVPB 1000 mg/200 mL premix  Status:  Discontinued        1,000 mg 200 mL/hr over 60 Minutes Intravenous Every 12 hours 02/06/12 1448 02/08/12 1133   02/06/12 1445   ceFEPIme (MAXIPIME) 1 g in dextrose 5 % 50 mL IVPB  Status:  Discontinued        1 g 100 mL/hr over 30 Minutes Intravenous 3 times per day 02/06/12 1431 02/08/12 1137   02/06/12 1445   levofloxacin (LEVAQUIN) IVPB 750 mg  Status:  Discontinued        750 mg 100 mL/hr over 90 Minutes Intravenous Every 24 hours 02/06/12 1431 02/08/12 1137          Assessment/Plan: s/p * No surgery found * Advance diet Continue abx and drain Transfer to floor Replace K  LOS: 3 days    TOTH III,PAUL S 02/09/2012

## 2012-02-09 NOTE — Progress Notes (Signed)
Report called to Amy, RN.  Transferred to room 1504 via bed.  Patient alert and oriented.

## 2012-02-10 LAB — CULTURE, ROUTINE-ABSCESS

## 2012-02-10 MED ORDER — POTASSIUM CHLORIDE 10 MEQ/100ML IV SOLN
10.0000 meq | INTRAVENOUS | Status: AC
  Administered 2012-02-10 (×4): 10 meq via INTRAVENOUS
  Filled 2012-02-10 (×4): qty 100

## 2012-02-10 NOTE — Clinical Social Work Psychosocial (Unsigned)
     Clinical Social Work Department BRIEF PSYCHOSOCIAL ASSESSMENT 02/10/2012  Patient:  Michele David, Michele David     Account Number:  [id number]     Admit date:  02/06/2012  Clinical Social Worker:  Hattie Perch  Date/Time:  02/10/2012 12:00 M  Referred by:  Physician  Date Referred:  02/10/2012 Referred for  SNF Placement   Other Referral:   Interview type:  Patient Other interview type:    PSYCHOSOCIAL DATA Living Status:  FACILITY Admitted from facility:  [facility] Level of care:  Skilled Nursing Facility Primary support name:  Chinenye Katzenberger Primary support relationship to patient:  CHILD, ADULT Degree of support available:   good    CURRENT CONCERNS Current Concerns  Post-Acute Placement   Other Concerns:    SOCIAL WORK ASSESSMENT / PLAN CSW met with patient. Patient is alert and oriented. Patient confirms that she is from [facility] but states that she has not decided if she will return or not.   Assessment/plan status:   Other assessment/ plan:   Information/referral to community resources:    PATIENTS/FAMILYS RESPONSE TO PLAN OF CARE: CSW to follow for return to [facility] versus home.

## 2012-02-10 NOTE — Progress Notes (Signed)
PT Cancellation Note  Patient Details Name: KYLAH MARESH MRN: [id number] DOB: 07-20-1945   Cancelled Treatment:     Pt declined to participate this pm. Will check back another time. Thanks.    [name] [facility] 02/10/2012, 3:54 PM [phone]

## 2012-02-10 NOTE — Progress Notes (Signed)
Subjective: Pt ok. Trying to eat reg diet this am. Offers no new c/o  Objective: Physical Exam: BP 149/55  Pulse 80  Temp 98.8 F (37.1 C) (Oral)  Resp 24  Ht 5\' 2"  (1.575 m)  Wt 159 lb 9.6 oz (72.394 kg)  BMI 29.19 kg/m2  SpO2 96% Drain intact, site clean Thin serous output, minimal past few days    Labs: CBC  Basename 02/09/12 0345  WBC 11.9*  HGB 9.3*  HCT 27.7*  PLT 627*   BMET  Basename 02/09/12 0345 02/08/12 1200  NA 137 136  K 2.8* 3.3*  CL 103 104  CO2 23 21  GLUCOSE 144* 97  BUN 10 10  CREATININE 0.91 0.81  CALCIUM 7.3* 7.2*   LFT  Basename 02/09/12 0345  PROT 5.5*  ALBUMIN 1.5*  AST 18  ALT 26  ALKPHOS 135*  BILITOT 0.6  BILIDIR --  IBILI --  LIPASE --   PT/INR No results found for this basename: LABPROT:2,INR:2 in the last 72 hours   Studies/Results: No results found.  Assessment/Plan: s/p abd fluid coll drainage 10/20;   check f/u CT later this week if output remains low    LOS: 4 days    Brayton El PA-C 02/10/2012 8:56 AM

## 2012-02-10 NOTE — Progress Notes (Signed)
  Subjective: Pt reports feeling a little bloated after breakfast, no other complaints, +flatus and BMs, denies fevers or chills  Objective: Vital signs in last 24 hours: Temp:  [98.1 F (36.7 C)-99.1 F (37.3 C)] 98.8 F (37.1 C) (10/23 0552) Pulse Rate:  [67-80] 80  (10/23 0552) Resp:  [22-32] 24  (10/23 0552) BP: (135-151)/(51-71) 149/55 mmHg (10/23 0552) SpO2:  [93 %-100 %] 96 % (10/23 0552) Weight:  [158 lb 11.7 oz (72 kg)-159 lb 9.6 oz (72.394 kg)] 159 lb 9.6 oz (72.394 kg) (10/23 0552) Last BM Date: 02/08/12  Intake/Output from previous day: 10/22 0701 - 10/23 0700 In: 473.8 [P.O.:240; I.V.:196.3; IV Piggyback:37.5] Out: 1001 [Urine:1001] Intake/Output this shift:   Physical Exam: GI: soft, minimal tenderness. wound clean with beefy red tissue in base Drain with mildly cloudy serous looking output.  Lab Results:   [facility] 02/09/12 0345  WBC 11.9*  HGB 9.3*  HCT 27.7*  PLT 627*   BMET  Basename 02/09/12 0345 02/08/12 1200  NA 137 136  K 2.8* 3.3*  CL 103 104  CO2 23 21  GLUCOSE 144* 97  BUN 10 10  CREATININE 0.91 0.81  CALCIUM 7.3* 7.2*   PT/INR No results found for this basename: LABPROT:2,INR:2 in the last 72 hours ABG No results found for this basename: PHART:2,PCO2:2,PO2:2,HCO3:2 in the last 72 hours  Studies/Results: No results found.  Anti-infectives: Anti-infectives     Start     Dose/Rate Route Frequency Ordered Stop   02/08/12 1400   ceFEPIme (MAXIPIME) 1 g in dextrose 5 % 50 mL IVPB  Status:  Discontinued        1 g 100 mL/hr over 30 Minutes Intravenous 3 times per day 02/08/12 1137 02/08/12 1141   02/08/12 1400   piperacillin-tazobactam (ZOSYN) IVPB 3.375 g        3.375 g 12.5 mL/hr over 240 Minutes Intravenous Every 8 hours 02/08/12 1145     02/08/12 1300   levofloxacin (LEVAQUIN) IVPB 750 mg  Status:  Discontinued        750 mg 100 mL/hr over 90 Minutes Intravenous Every 24 hours 02/08/12 1137 02/08/12 1141   02/06/12 1800    vancomycin (VANCOCIN) IVPB 1000 mg/200 mL premix  Status:  Discontinued        1,000 mg 200 mL/hr over 60 Minutes Intravenous Every 12 hours 02/06/12 1448 02/08/12 1133   02/06/12 1445   ceFEPIme (MAXIPIME) 1 g in dextrose 5 % 50 mL IVPB  Status:  Discontinued        1 g 100 mL/hr over 30 Minutes Intravenous 3 times per day 02/06/12 1431 02/08/12 1137   02/06/12 1445   levofloxacin (LEVAQUIN) IVPB 750 mg  Status:  Discontinued        750 mg 100 mL/hr over 90 Minutes Intravenous Every 24 hours 02/06/12 1431 02/08/12 1137          Assessment/Plan: 1. POD#18-Exploratory laparotomy, Extensive lysis of adhesions, Resection of ileocolonic anastomosis with perforation, Ileocolonic anastomosis between mid ileum and proximal transverse colon 10/5: presented this admission with fevers and abdominal abscess, now s/p perc drain, culture with no growth thus far.  --for repeat CT later this week  --Continue regular diet  --OOB and ambulate in hall  --dressing changes to abd wound twice daily with wet to dry  --continue zosyn    LOS: 4 days    WHITE, ELIZABETH 02/10/2012

## 2012-02-11 ENCOUNTER — Inpatient Hospital Stay (HOSPITAL_COMMUNITY): Payer: Medicare Other

## 2012-02-11 ENCOUNTER — Encounter (HOSPITAL_COMMUNITY): Payer: Self-pay | Admitting: Radiology

## 2012-02-11 LAB — BASIC METABOLIC PANEL
BUN: 11 mg/dL (ref 6–23)
CO2: 22 mEq/L (ref 19–32)
Calcium: 7.6 mg/dL — ABNORMAL LOW (ref 8.4–10.5)
Chloride: 107 mEq/L (ref 96–112)
Creatinine, Ser: 0.8 mg/dL (ref 0.50–1.10)
GFR calc Af Amer: 86 mL/min — ABNORMAL LOW (ref >90)
GFR calc non Af Amer: 75 mL/min — ABNORMAL LOW (ref >90)
Glucose, Bld: 138 mg/dL — ABNORMAL HIGH (ref 70–99)
Potassium: 3.6 mEq/L (ref 3.5–5.1)
Sodium: 138 mEq/L (ref 135–145)

## 2012-02-11 MED ORDER — IOHEXOL 300 MG/ML  SOLN
100.0000 mL | Freq: Once | INTRAMUSCULAR | Status: AC | PRN
  Administered 2012-02-11: 100 mL via INTRAVENOUS

## 2012-02-11 NOTE — Progress Notes (Signed)
Physical Therapy Treatment Patient Details Name: Michele David MRN: [id number] DOB: 1946-02-18 Today's Date: 02/11/2012 Time: 4098-1191 PT Time Calculation (min): 13 min  PT Assessment / Plan / Recommendation Comments on Treatment Session  Progressing slowly with mobility. Pt continues to demonstrate general weakness, decreased activity tolerance, and impaired balance. Feel pt would benefit from return to SNF for ST rehab however pt is declining to return to facility. Will need HHPT and 24 hour supervision. Pt states she has steps to get up to bedroom. At this time, therapist does not feel pt will be able to negotiate full flight of steps. Safest option would be for pt to stay on 1st level, if at all possible.     Follow Up Recommendations  Post acute inpatient vsSupervision/Assistance - 24 hour;Home health PT (Pt declining SNF)     Does the patient have the potential to tolerate intense rehabilitation  No, Recommend SNF (Pt declining)  Barriers to Discharge        Equipment Recommendations  3 in 1 bedside comode;Rolling walker with 5" wheels    Recommendations for Other Services    Frequency Min 3X/week   Plan Discharge plan remains appropriate    Precautions / Restrictions Precautions Precautions: Fall Precaution Comments: abd drain R Restrictions Weight Bearing Restrictions: No   Pertinent Vitals/Pain Pt denies    Mobility  Bed Mobility Bed Mobility: Supine to Sit Supine to Sit: HOB elevated;With rails;4: Min assist Details for Bed Mobility Assistance: Assist for trunk to upright.  Transfers Transfers: Stand to Sit;Sit to Stand Sit to Stand: 4: Min assist;From bed;With upper extremity assist Stand to Sit: 4: Min assist;To chair/3-in-1;With armrests;With upper extremity assist Details for Transfer Assistance: VCs safety, technique,hand placement. Assist to rise, stabilize, control descent.  Ambulation/Gait Ambulation/Gait Assistance: 4: Min assist Ambulation  Distance (Feet): 60 Feet Assistive device: Rolling walker Ambulation/Gait Assistance Details: VCs safety. Assist to stabilize throughout ambulation. Noted knee buckling towards end of ambulation distance. Min assist to prevent LOB/fall. Fatigues easily.  Gait Pattern: Decreased stride length;Decreased step length - right;Decreased step length - left    Exercises     PT Diagnosis:    PT Problem List:   PT Treatment Interventions:     PT Goals Acute Rehab PT Goals Pt will go Supine/Side to Sit: Independently PT Goal: Supine/Side to Sit - Progress: Progressing toward goal Pt will go Sit to Stand: with modified independence PT Goal: Sit to Stand - Progress: Progressing toward goal Pt will go Stand to Sit: with modified independence PT Goal: Stand to Sit - Progress: Progressing toward goal Pt will Ambulate: 51 - 150 feet;with modified independence;with least restrictive assistive device PT Goal: Ambulate - Progress: Progressing toward goal  Visit Information  Last PT Received On: 02/11/12 Assistance Needed: +1    Subjective Data  Subjective: "I think we should just do a little bit at a time" Patient Stated Goal: Home   Cognition  Overall Cognitive Status: Appears within functional limits for tasks assessed/performed Arousal/Alertness: Awake/alert Orientation Level: Appears intact for tasks assessed Behavior During Session: [facility] for tasks performed    Balance     End of Session PT - End of Session Activity Tolerance: Patient limited by fatigue Patient left: in chair;with call bell/phone within reach   GP     [name] [facility] 02/11/2012, 3:49 PM [phone]

## 2012-02-11 NOTE — Progress Notes (Signed)
Consider IR consult for drain effusion

## 2012-02-11 NOTE — Progress Notes (Addendum)
ANTIBIOTIC CONSULT NOTE - Follow Up  Pharmacy Consult for Zosyn Indication: Abdominal abscess  Allergies  Allergen Reactions  . Tomato Other (See Comments)    Reaction: caused bumps in her mouth    Patient Measurements: Height: 5\' 2"  (157.5 cm) Weight: 159 lb 9.6 oz (72.394 kg) (standing scale) IBW/kg (Calculated) : 50.1   Vital Signs: Temp: 99.8 F (37.7 C) (10/24 1610) Temp src: Oral (10/23 2230) BP: 164/71 mmHg (10/24 0652) Pulse Rate: 76  (10/24 0652) Intake/Output from previous day: 10/23 0701 - 10/24 0700 In: 100 [IV Piggyback:100] Out: 550 [Urine:550] Intake/Output from this shift:    Labs:  Basename 02/11/12 0511 02/09/12 0345 02/08/12 1200  WBC -- 11.9* --  HGB -- 9.3* --  PLT -- 627* --  LABCREA -- -- --  CREATININE 0.80 0.91 0.81   Estimated Creatinine Clearance: 63.6 ml/min (by C-G formula based on Cr of 0.8). No results found for this basename: VANCOTROUGH:2,VANCOPEAK:2,VANCORANDOM:2,GENTTROUGH:2,GENTPEAK:2,GENTRANDOM:2,TOBRATROUGH:2,TOBRAPEAK:2,TOBRARND:2,AMIKACINPEAK:2,AMIKACINTROU:2,AMIKACIN:2, in the last 72 hours   Medical History: Past Medical History  Diagnosis Date  . Bowel perforation   . Crohn disease   . Hypertension   . Hyperlipemia   . Swelling of extremity, left   . Swelling of extremity, right    Microbiology: 10/19 urine: 20,000 colonies/ml multiple morphotypes 10/20 abscess drain: ngtd 10/20: MRSA negative  Assessment:  53 YOF admitted from SNF with fever.  Pt recently discharged s/p ileocecectomy for Crohn's disease with perforation.  Found to have abdominal abscess beneath incision, perc drain placed 10/20.  Patient empirically started on vancomycin/cefepime/levaquin 10/19 for questionable HCAP as fever source, switched therapy to Zosyn for empiric treatment for abdominal abscess at that point.  CrCl continues to be >20 ml/min    Day 6 total abx's and Day 4 Zosyn  Goal of Therapy:  doses adjusted per renal  clearance  Plan:  Continue Zosyn 3.375g IV q8h (4 hour infusion time). F/u cultures and renal function.   Hessie Knows, PharmD, BCPS Pager [phone] 02/11/2012 8:42 AM

## 2012-02-11 NOTE — Progress Notes (Signed)
Patient ID: Michele David, female   DOB: 05-03-1945, 66 y.o.   MRN: [id number]    Subjective: No complaints, +flatus and BMs, denies fevers or chills, wants to go home and not to rehab  Objective: Vital signs in last 24 hours: Temp:  [98.7 F (37.1 C)-99.8 F (37.7 C)] 99.4 F (37.4 C) (10/24 0848) Pulse Rate:  [70-76] 70  (10/24 0848) Resp:  [18-28] 18  (10/24 0848) BP: (149-173)/(46-71) 162/70 mmHg (10/24 0848) SpO2:  [94 %-100 %] 94 % (10/24 0848) Last BM Date: 02/10/12  Intake/Output from previous day: 10/23 0701 - 10/24 0700 In: 100 [IV Piggyback:100] Out: 550 [Urine:550] Intake/Output this shift:   Physical Exam: GI: soft, minimal tenderness. wound clean with beefy red tissue in base Drain with mildly cloudy serous looking output.  Lab Results:   [facility] 02/09/12 0345  WBC 11.9*  HGB 9.3*  HCT 27.7*  PLT 627*   BMET  Basename 02/11/12 0511 02/09/12 0345  NA 138 137  K 3.6 2.8*  CL 107 103  CO2 22 23  GLUCOSE 138* 144*  BUN 11 10  CREATININE 0.80 0.91  CALCIUM 7.6* 7.3*   PT/INR No results found for this basename: LABPROT:2,INR:2 in the last 72 hours ABG No results found for this basename: PHART:2,PCO2:2,PO2:2,HCO3:2 in the last 72 hours  Studies/Results: Ct Abdomen Pelvis W Contrast  02/11/2012  *RADIOLOGY REPORT*  Clinical Data: 66 year old female status post abscess drainage. Crohn disease.  CT ABDOMEN AND PELVIS WITH CONTRAST  Technique:  Multidetector CT imaging of the abdomen and pelvis was performed following the standard protocol during bolus administration of intravenous contrast.  Contrast: OMNIPAQUE IOHEXOL 300 MG/ML  SOLN  Comparison: 02/07/2012 CT guided abscess drainage.  Preoperative study 02/06/2012.  Findings: Increased layering right pleural effusion, now moderate to large.  Stable small to moderate layering left pleural effusion. No pericardial effusion.  Associated lower lobe collapse.  Stable visualized osseous structures.   Stable left adnexal cyst.  Stable mild presacral stranding or trace fluid.  Fluid in the rectum.  Uterus and right adnexa are unremarkable.  Continued mild wall thickening of the sigmoid colon and descending colon.  Oral contrast has reached the splenic flexure.  Percutaneous pigtail drainage catheter in place at the level of the midline ventral peritoneal cavity abscess which has decreased.  Small volume residual fluid noted (series 2 image 52). There is no longer gas collection.  Postoperative changes to the right colon.  Severe stranding in the right abdominal mesentery and involving right abdominal small bowel has not significantly changed.  Stable small bowel caliber.  Severe thickening and inflammation of the gastric antrum and proximal duodenum has not significantly changed.  Stranding at the porta hepatis and about the gallbladder is stable.  Small volume.  Panic fluid appears similar.  No definite liver abscess.  Mildly increased oval hypodensity in the spleen on series 2 image 19.  Previously this was felt to be infarct.  There are several new smaller similar areas in the spleen, mostly along the periphery.  Negative pancreas.  Stable adrenal hyperplasia.  Stable kidneys without hydronephrosis.  Punctate nephrolithiasis.  Portal venous system is patent.  Major arterial structures in the abdomen pelvis are patent.  Open ventral abdominal wound is stable.  IMPRESSION: 1.  Interval decreased ventral peritoneal cavity abscess with percutaneous pigtail drain in place.  Small volume residual abscess fluid. 2.  Increased peripheral hypodense areas in the spleen.  Previously these were thought to be infarcts.  No  findings highly suspicious for splenic abscess at this time. 3.  Increased right pleural effusion now moderate to large.  Stable small to moderate left pleural effusion. 4.  Trace perihepatic fluid.  No definite liver abscess. 5.  Extensive inflammation in the right abdomen has not significantly changed.  6.  No bowel obstruction.   Original Report Authenticated By: Harley Hallmark, M.D.     Anti-infectives: Anti-infectives     Start     Dose/Rate Route Frequency Ordered Stop   02/08/12 1400   ceFEPIme (MAXIPIME) 1 g in dextrose 5 % 50 mL IVPB  Status:  Discontinued        1 g 100 mL/hr over 30 Minutes Intravenous 3 times per day 02/08/12 1137 02/08/12 1141   02/08/12 1400   piperacillin-tazobactam (ZOSYN) IVPB 3.375 g        3.375 g 12.5 mL/hr over 240 Minutes Intravenous Every 8 hours 02/08/12 1145     02/08/12 1300   levofloxacin (LEVAQUIN) IVPB 750 mg  Status:  Discontinued        750 mg 100 mL/hr over 90 Minutes Intravenous Every 24 hours 02/08/12 1137 02/08/12 1141   02/06/12 1800   vancomycin (VANCOCIN) IVPB 1000 mg/200 mL premix  Status:  Discontinued        1,000 mg 200 mL/hr over 60 Minutes Intravenous Every 12 hours 02/06/12 1448 02/08/12 1133   02/06/12 1445   ceFEPIme (MAXIPIME) 1 g in dextrose 5 % 50 mL IVPB  Status:  Discontinued        1 g 100 mL/hr over 30 Minutes Intravenous 3 times per day 02/06/12 1431 02/08/12 1137   02/06/12 1445   levofloxacin (LEVAQUIN) IVPB 750 mg  Status:  Discontinued        750 mg 100 mL/hr over 90 Minutes Intravenous Every 24 hours 02/06/12 1431 02/08/12 1137          Assessment/Plan: 1. POD#19-Exploratory laparotomy, Extensive lysis of adhesions, Resection of ileocolonic anastomosis with perforation, Ileocolonic anastomosis between mid ileum and proximal transverse colon 10/5: presented this admission with fevers and abdominal abscess, now s/p perc drain, culture with no growth thus far, CT today shows decrease in abscess fluid collection  --Continue regular diet  --OOB and ambulate in hall  --dressing changes to abd wound twice daily with wet to dry, ?consider wound vac now  --continue zosyn  2. Large Right pleural effusion and small left: no sob or wheezing  --IS  --OOB and coughing teaching.   Dispo: patient wants to go  home but will need 24/7 supervision.  Also will need home health RN and PT.  Will have care management work with patient regarding discharge planning.  Medically could go home in next 24-48 hours.  Will also discuss with Dr. Carolynne Edouard   LOS: 5 days    Necie Wilcoxson, [facility] 02/11/2012

## 2012-02-11 NOTE — Progress Notes (Signed)
Subjective: Pt feeling better  Objective: Vital signs in last 24 hours: Temp:  [98.7 F (37.1 C)-99.8 F (37.7 C)] 99.4 F (37.4 C) (10/24 0848) Pulse Rate:  [70-76] 70  (10/24 0848) Resp:  [18-28] 18  (10/24 0848) BP: (149-173)/(46-71) 162/70 mmHg (10/24 0848) SpO2:  [94 %-100 %] 94 % (10/24 0848) Last BM Date: 02/10/12  Intake/Output from previous day: 10/23 0701 - 10/24 0700 In: 100 [IV Piggyback:100] Out: 550 [Urine:550] Intake/Output this shift:    Rt abd drain intact, output about 10-15 cc's; drain flushed with 5 cc's sterile NS with return of same amt  Lab Results:   Basename 02/09/12 0345  WBC 11.9*  HGB 9.3*  HCT 27.7*  PLT 627*   BMET  Basename 02/11/12 0511 02/09/12 0345  NA 138 137  K 3.6 2.8*  CL 107 103  CO2 22 23  GLUCOSE 138* 144*  BUN 11 10  CREATININE 0.80 0.91  CALCIUM 7.6* 7.3*   PT/INR No results found for this basename: LABPROT:2,INR:2 in the last 72 hours ABG No results found for this basename: PHART:2,PCO2:2,PO2:2,HCO3:2 in the last 72 hours Results for orders placed during the hospital encounter of 02/06/12  URINE CULTURE     Status: Normal   Collection Time   02/06/12  2:15 PM      Component Value Range Status Comment   Specimen Description URINE, CLEAN CATCH   Final    Special Requests NONE   Final    Culture  Setup Time 02/06/2012 19:08   Final    Colony Count 20,OOO COLONIES/ML   Final    Culture     Final    Value: Multiple bacterial morphotypes present, none predominant. Suggest appropriate recollection if clinically indicated.   Report Status 02/07/2012 FINAL   Final   MRSA PCR SCREENING     Status: Normal   Collection Time   02/07/12 12:42 AM      Component Value Range Status Comment   MRSA by PCR NEGATIVE  NEGATIVE Final   CULTURE, ROUTINE-ABSCESS     Status: Normal   Collection Time   02/07/12 10:22 AM      Component Value Range Status Comment   Specimen Description DRAINAGE   Final    Special Requests Normal    Final    Gram Stain     Final    Value: ABUNDANT WBC PRESENT, PREDOMINANTLY PMN     NO SQUAMOUS EPITHELIAL CELLS SEEN     NO ORGANISMS SEEN   Culture NO GROWTH 3 DAYS   Final    Report Status 02/10/2012 FINAL   Final     Studies/Results: Ct Abdomen Pelvis W Contrast  02/11/2012  *RADIOLOGY REPORT*  Clinical Data: 66 year old female status post abscess drainage. Crohn disease.  CT ABDOMEN AND PELVIS WITH CONTRAST  Technique:  Multidetector CT imaging of the abdomen and pelvis was performed following the standard protocol during bolus administration of intravenous contrast.  Contrast: OMNIPAQUE IOHEXOL 300 MG/ML  SOLN  Comparison: 02/07/2012 CT guided abscess drainage.  Preoperative study 02/06/2012.  Findings: Increased layering right pleural effusion, now moderate to large.  Stable small to moderate layering left pleural effusion. No pericardial effusion.  Associated lower lobe collapse.  Stable visualized osseous structures.  Stable left adnexal cyst.  Stable mild presacral stranding or trace fluid.  Fluid in the rectum.  Uterus and right adnexa are unremarkable.  Continued mild wall thickening of the sigmoid colon and descending colon.  Oral contrast has reached the  splenic flexure.  Percutaneous pigtail drainage catheter in place at the level of the midline ventral peritoneal cavity abscess which has decreased.  Small volume residual fluid noted (series 2 image 52). There is no longer gas collection.  Postoperative changes to the right colon.  Severe stranding in the right abdominal mesentery and involving right abdominal small bowel has not significantly changed.  Stable small bowel caliber.  Severe thickening and inflammation of the gastric antrum and proximal duodenum has not significantly changed.  Stranding at the porta hepatis and about the gallbladder is stable.  Small volume.  Panic fluid appears similar.  No definite liver abscess.  Mildly increased oval hypodensity in the spleen on  series 2 image 19.  Previously this was felt to be infarct.  There are several new smaller similar areas in the spleen, mostly along the periphery.  Negative pancreas.  Stable adrenal hyperplasia.  Stable kidneys without hydronephrosis.  Punctate nephrolithiasis.  Portal venous system is patent.  Major arterial structures in the abdomen pelvis are patent.  Open ventral abdominal wound is stable.  IMPRESSION: 1.  Interval decreased ventral peritoneal cavity abscess with percutaneous pigtail drain in place.  Small volume residual abscess fluid. 2.  Increased peripheral hypodense areas in the spleen.  Previously these were thought to be infarcts.  No findings highly suspicious for splenic abscess at this time. 3.  Increased right pleural effusion now moderate to large.  Stable small to moderate left pleural effusion. 4.  Trace perihepatic fluid.  No definite liver abscess. 5.  Extensive inflammation in the right abdomen has not significantly changed. 6.  No bowel obstruction.   Original Report Authenticated By: Harley Hallmark, M.D.     Anti-infectives: Anti-infectives     Start     Dose/Rate Route Frequency Ordered Stop   02/08/12 1400   ceFEPIme (MAXIPIME) 1 g in dextrose 5 % 50 mL IVPB  Status:  Discontinued        1 g 100 mL/hr over 30 Minutes Intravenous 3 times per day 02/08/12 1137 02/08/12 1141   02/08/12 1400   piperacillin-tazobactam (ZOSYN) IVPB 3.375 g        3.375 g 12.5 mL/hr over 240 Minutes Intravenous Every 8 hours 02/08/12 1145     02/08/12 1300   levofloxacin (LEVAQUIN) IVPB 750 mg  Status:  Discontinued        750 mg 100 mL/hr over 90 Minutes Intravenous Every 24 hours 02/08/12 1137 02/08/12 1141   02/06/12 1800   vancomycin (VANCOCIN) IVPB 1000 mg/200 mL premix  Status:  Discontinued        1,000 mg 200 mL/hr over 60 Minutes Intravenous Every 12 hours 02/06/12 1448 02/08/12 1133   02/06/12 1445   ceFEPIme (MAXIPIME) 1 g in dextrose 5 % 50 mL IVPB  Status:  Discontinued         1 g 100 mL/hr over 30 Minutes Intravenous 3 times per day 02/06/12 1431 02/08/12 1137   02/06/12 1445   levofloxacin (LEVAQUIN) IVPB 750 mg  Status:  Discontinued        750 mg 100 mL/hr over 90 Minutes Intravenous Every 24 hours 02/06/12 1431 02/08/12 1137          Assessment/Plan: s/p ant abd fluid collection drainage 10/20; f/u CT today shows decreased but not fully resolved collection; plans as per CCS.  LOS: 5 days    ALLRED,D [facility] 02/11/2012

## 2012-02-11 NOTE — Progress Notes (Signed)
Met with pt and daughter to discuss discharge planning. Pt wants to go home at discharge and family can provide 24/7 assistance. She has chosen [facility] to provide the home health services and Norberta Keens the liaison made aware.

## 2012-02-12 ENCOUNTER — Inpatient Hospital Stay (HOSPITAL_COMMUNITY): Payer: Medicare Other

## 2012-02-12 LAB — CBC
HCT: 27 % — ABNORMAL LOW (ref 36.0–46.0)
Hemoglobin: 8.9 g/dL — ABNORMAL LOW (ref 12.0–15.0)
MCH: 27.3 pg (ref 26.0–34.0)
MCHC: 33 g/dL (ref 30.0–36.0)
MCV: 82.8 fL (ref 78.0–100.0)
Platelets: 525 10*3/uL — ABNORMAL HIGH (ref 150–400)
RBC: 3.26 MIL/uL — ABNORMAL LOW (ref 3.87–5.11)
RDW: 16 % — ABNORMAL HIGH (ref 11.5–15.5)
WBC: 13.7 10*3/uL — ABNORMAL HIGH (ref 4.0–10.5)

## 2012-02-12 LAB — LACTATE DEHYDROGENASE, PLEURAL OR PERITONEAL FLUID: LD, Fluid: 184 U/L — ABNORMAL HIGH (ref 3–23)

## 2012-02-12 LAB — PROTEIN, BODY FLUID: Total protein, fluid: 2.8 g/dL

## 2012-02-12 LAB — BODY FLUID CELL COUNT WITH DIFFERENTIAL
Eos, Fluid: 0 %
Lymphs, Fluid: 6 %
Neutrophil Count, Fluid: 91 % — ABNORMAL HIGH (ref 0–25)

## 2012-02-12 LAB — GLUCOSE, SEROUS FLUID: Glucose, Fluid: 133 mg/dL

## 2012-02-12 NOTE — Procedures (Signed)
US guided diagnostic/therapeutic right thoracentesis performed yielding 1.1 liters slightly turbid, yellow fluid. The fluid was sent to the lab for preordered studies. F/u CXR pending. No immediate complications. 

## 2012-02-12 NOTE — Progress Notes (Signed)
Dressing changed to midline abdominal wound.  Old dressing with scant amount of light yellow drainage.  No odor noted.  Wound bed looks red, no sloughing noted, no necrotic areas noted.  No redness or swelling surrounding the wound.  New wet to dry dressing applied.  Will continue to monitor.

## 2012-02-12 NOTE — Progress Notes (Signed)
Patient with bloody stool today, moderate in amount as per nursing tech, was unable to distinguish whether blood was bright red or dark red.  Dr. Doristine Mango made aware.  Stool for ocult blood needs to be collected.  Patient is aware.

## 2012-02-12 NOTE — Progress Notes (Signed)
Patient returned from ultrasound guided thoracentesis.  Patient is awake alert and oriented.  Patient has a temperature of 100.9 at this time.  Dr. Doristine Mango made aware.  Tylenol suggested to give patient.  RN rechecked patient's temperature at 12:15pm and came down to 98.6 on it's own.  Encouraged patient to use incentive spirometer.  Will continue to monitor.

## 2012-02-12 NOTE — Progress Notes (Signed)
Physical Therapy Treatment Patient Details Name: Michele David MRN: [id number] DOB: 1945/07/31 Today's Date: 02/12/2012 Time: 4098-1191 PT Time Calculation (min): 33 min  PT Assessment / Plan / Recommendation Comments on Treatment Session  Progressing slowly. Pt continues to require Moderate encouragement to increase activity level. Therapist continues to explain to pt that she needs to mobilize as much as possible during hospital stay. Continue to recommend SNF for ST rehab, but pt declines return to SNF. Pt will need at least +1 assist at home. Do not feel pt will be able to climb stairs to enter home. May need ambulance transport.    Follow Up Recommendations  Post acute inpatient;Home health PT;Supervision/Assistance - 24 hour (pt declining return to SNF)     Does the patient have the potential to tolerate intense rehabilitation  No, Recommend SNF  Barriers to Discharge        Equipment Recommendations  Rolling walker with 5" wheels;3 in 1 bedside comode    Recommendations for Other Services OT consult  Frequency Min 3X/week   Plan Discharge plan remains appropriate    Precautions / Restrictions Precautions Precautions: Fall Precaution Comments: abd drain R Restrictions Weight Bearing Restrictions: No   Pertinent Vitals/Pain R abdomen    Mobility  Bed Mobility Bed Mobility: Rolling Right;Right Sidelying to Sit Rolling Right: 5: Supervision Right Sidelying to Sit: 4: Min assist Sit to Supine: 4: Min assist Details for Bed Mobility Assistance: Assist for trunk to upright. VCs safety, technique,hand placement Transfers Transfers: Sit to Stand;Stand to Sit Sit to Stand: 4: Min assist;From bed Stand to Sit: 4: Min assist;To bed Details for Transfer Assistance: vCS safety, technique,hand placement. assist to rise, stabilize, control descent.  Ambulation/Gait Ambulation/Gait Assistance: 4: Min assist Ambulation Distance (Feet): 50 Feet Assistive device: Rolling  walker Ambulation/Gait Assistance Details: Pt continues to demonstrate LE instability during ambulation. VCs safety, distance from RW. Fatigues easily.  Gait Pattern: Decreased stride length;Decreased step length - right;Decreased step length - left;Step-through pattern;Trunk flexed    Exercises General Exercises - Lower Extremity Ankle Circles/Pumps: AROM;Both;Supine;10 reps Quad Sets: AROM;Both;10 reps;Supine Gluteal Sets: AROM;10 reps;Supine Long Arc Quad: AROM;10 reps;Both;Seated Hip ABduction/ADduction: AAROM;Both;10 reps;Supine   PT Diagnosis:    PT Problem List:   PT Treatment Interventions:     PT Goals Acute Rehab PT Goals Pt will go Supine/Side to Sit: Independently PT Goal: Supine/Side to Sit - Progress: Progressing toward goal Pt will go Sit to Supine/Side: Independently PT Goal: Sit to Supine/Side - Progress: Progressing toward goal Pt will go Sit to Stand: with modified independence PT Goal: Sit to Stand - Progress: Progressing toward goal Pt will go Stand to Sit: with modified independence PT Goal: Stand to Sit - Progress: Progressing toward goal Pt will Ambulate: 51 - 150 feet;with modified independence;with least restrictive assistive device PT Goal: Ambulate - Progress: Progressing toward goal  Visit Information  Last PT Received On: 02/12/12 Assistance Needed: +1    Subjective Data  Subjective: "I don't wanna do too much" Patient Stated Goal: Home   Cognition  Overall Cognitive Status: Appears within functional limits for tasks assessed/performed Arousal/Alertness: Awake/alert Orientation Level: Appears intact for tasks assessed Behavior During Session: [facility] for tasks performed    Balance     End of Session PT - End of Session Equipment Utilized During Treatment: Oxygen Activity Tolerance: Patient limited by fatigue;Patient limited by pain Patient left: in bed;with call bell/phone within reach   GP     [name] [facility] 02/12/2012, 3:36  PM [id number]

## 2012-02-12 NOTE — Progress Notes (Signed)
CSW continues to follow for return to [facility].  Arna Luis C. Lolita Faulds MSW, LCSW [phone]  

## 2012-02-12 NOTE — Progress Notes (Signed)
Patient ID: Michele David, female   DOB: Feb 09, 1946, 66 y.o.   MRN: [id number]    Subjective: No complaints, +flatus and BMs, denies fevers or chills, wants to go home and not to rehab, for pleural effusion tap today  Objective: Vital signs in last 24 hours: Temp:  [98.2 F (36.8 C)-101.8 F (38.8 C)] 98.2 F (36.8 C) (10/25 0600) Pulse Rate:  [71-79] 74  (10/25 0600) Resp:  [18-32] 32  (10/25 0600) BP: (155-178)/(53-74) 155/71 mmHg (10/25 0915) SpO2:  [94 %-100 %] 99 % (10/25 0915) Last BM Date: 02/11/12  Intake/Output from previous day: 10/24 0701 - 10/25 0700 In: 2007.7 [P.O.:680; I.V.:1177.7; IV Piggyback:150] Out: 40 [Urine:25; Drains:15] Intake/Output this shift:   Physical Exam: GI: soft, minimal tenderness. wound clean with beefy red tissue in base Drain with mildly cloudy serous looking output.  Lab Results:   [facility] 02/12/12 0502  WBC 13.7*  HGB 8.9*  HCT 27.0*  PLT 525*   BMET  Basename 02/11/12 0511  NA 138  K 3.6  CL 107  CO2 22  GLUCOSE 138*  BUN 11  CREATININE 0.80  CALCIUM 7.6*   PT/INR No results found for this basename: LABPROT:2,INR:2 in the last 72 hours ABG No results found for this basename: PHART:2,PCO2:2,PO2:2,HCO3:2 in the last 72 hours  Studies/Results: Ct Abdomen Pelvis W Contrast  02/11/2012  *RADIOLOGY REPORT*  Clinical Data: 66 year old female status post abscess drainage. Crohn disease.  CT ABDOMEN AND PELVIS WITH CONTRAST  Technique:  Multidetector CT imaging of the abdomen and pelvis was performed following the standard protocol during bolus administration of intravenous contrast.  Contrast: OMNIPAQUE IOHEXOL 300 MG/ML  SOLN  Comparison: 02/07/2012 CT guided abscess drainage.  Preoperative study 02/06/2012.  Findings: Increased layering right pleural effusion, now moderate to large.  Stable small to moderate layering left pleural effusion. No pericardial effusion.  Associated lower lobe collapse.  Stable visualized  osseous structures.  Stable left adnexal cyst.  Stable mild presacral stranding or trace fluid.  Fluid in the rectum.  Uterus and right adnexa are unremarkable.  Continued mild wall thickening of the sigmoid colon and descending colon.  Oral contrast has reached the splenic flexure.  Percutaneous pigtail drainage catheter in place at the level of the midline ventral peritoneal cavity abscess which has decreased.  Small volume residual fluid noted (series 2 image 52). There is no longer gas collection.  Postoperative changes to the right colon.  Severe stranding in the right abdominal mesentery and involving right abdominal small bowel has not significantly changed.  Stable small bowel caliber.  Severe thickening and inflammation of the gastric antrum and proximal duodenum has not significantly changed.  Stranding at the porta hepatis and about the gallbladder is stable.  Small volume.  Panic fluid appears similar.  No definite liver abscess.  Mildly increased oval hypodensity in the spleen on series 2 image 19.  Previously this was felt to be infarct.  There are several new smaller similar areas in the spleen, mostly along the periphery.  Negative pancreas.  Stable adrenal hyperplasia.  Stable kidneys without hydronephrosis.  Punctate nephrolithiasis.  Portal venous system is patent.  Major arterial structures in the abdomen pelvis are patent.  Open ventral abdominal wound is stable.  IMPRESSION: 1.  Interval decreased ventral peritoneal cavity abscess with percutaneous pigtail drain in place.  Small volume residual abscess fluid. 2.  Increased peripheral hypodense areas in the spleen.  Previously these were thought to be infarcts.  No findings highly  suspicious for splenic abscess at this time. 3.  Increased right pleural effusion now moderate to large.  Stable small to moderate left pleural effusion. 4.  Trace perihepatic fluid.  No definite liver abscess. 5.  Extensive inflammation in the right abdomen has not  significantly changed. 6.  No bowel obstruction.   Original Report Authenticated By: Harley Hallmark, M.D.     Anti-infectives: Anti-infectives     Start     Dose/Rate Route Frequency Ordered Stop   02/08/12 1400   ceFEPIme (MAXIPIME) 1 g in dextrose 5 % 50 mL IVPB  Status:  Discontinued        1 g 100 mL/hr over 30 Minutes Intravenous 3 times per day 02/08/12 1137 02/08/12 1141   02/08/12 1400   piperacillin-tazobactam (ZOSYN) IVPB 3.375 g        3.375 g 12.5 mL/hr over 240 Minutes Intravenous Every 8 hours 02/08/12 1145     02/08/12 1300   levofloxacin (LEVAQUIN) IVPB 750 mg  Status:  Discontinued        750 mg 100 mL/hr over 90 Minutes Intravenous Every 24 hours 02/08/12 1137 02/08/12 1141   02/06/12 1800   vancomycin (VANCOCIN) IVPB 1000 mg/200 mL premix  Status:  Discontinued        1,000 mg 200 mL/hr over 60 Minutes Intravenous Every 12 hours 02/06/12 1448 02/08/12 1133   02/06/12 1445   ceFEPIme (MAXIPIME) 1 g in dextrose 5 % 50 mL IVPB  Status:  Discontinued        1 g 100 mL/hr over 30 Minutes Intravenous 3 times per day 02/06/12 1431 02/08/12 1137   02/06/12 1445   levofloxacin (LEVAQUIN) IVPB 750 mg  Status:  Discontinued        750 mg 100 mL/hr over 90 Minutes Intravenous Every 24 hours 02/06/12 1431 02/08/12 1137          Assessment/Plan: 1. POD#20-Exploratory laparotomy, Extensive lysis of adhesions, Resection of ileocolonic anastomosis with perforation, Ileocolonic anastomosis between mid ileum and proximal transverse colon 10/5: presented this admission with fevers and abdominal abscess, now s/p perc drain, culture with no growth thus far, CT showed decrease in abscess fluid collection  --Continue regular diet  --OOB and ambulate in hall  --dressing changes to abd wound twice daily with wet to dry, ?consider wound vac  --continue zosyn  2. Large Right pleural effusion and small left: no sob or wheezing  --IS  --OOB and coughing teaching.   Dispo: patient  wants to go home but will need 24/7 supervision.  Care management has evaluated patient and appropriate arrangements can be made for 24/7 supervision and [facility] PT/RN.  Pt for pleural effusion tap today and cs for fluid.  Likely this will make discharge over the weekend.  Pt cannot go home on Saturday due to supervision issues which would make discharge on Sunday.  Arranges ready for d/c Sunday but will need abx coverage for pleural collection, will see what gram stain shows.  Also abx for abd abscess.  Needs follow up in 2 weeks.     LOS: 6 days    Zakari Bathe 02/12/2012

## 2012-02-13 NOTE — Progress Notes (Signed)
Patient ID: Michele David, female   DOB: 1945-06-19, 66 y.o.   MRN: [id number]    Subjective: No complaints, +flatus and bloody BMs, denies fevers or chills, wants to go home and not to rehab, s/p thorocentesis yesterday, c/o pain at that site  Objective: Vital signs in last 24 hours: Temp:  [98.6 F (37 C)-100.9 F (38.3 C)] 99.1 F (37.3 C) (10/26 0645) Pulse Rate:  [62-79] 79  (10/26 0645) Resp:  [18-22] 18  (10/26 0645) BP: (142-163)/(61-83) 161/61 mmHg (10/26 0645) SpO2:  [94 %-99 %] 94 % (10/26 0645) Last BM Date: 02/11/12  Intake/Output from previous day: 10/25 0701 - 10/26 0700 In: 329.3 [I.V.:279.3; IV Piggyback:50] Out: 600 [Urine:600] Intake/Output this shift:   Physical Exam: Cardiac: RRR Lungs: CTA, decreased breath sounds in R base GI: soft, minimal tenderness. wound clean  Drain with mildly cloudy serous output.  Lab Results:   [facility] 02/12/12 0502  WBC 13.7*  HGB 8.9*  HCT 27.0*  PLT 525*   BMET  Basename 02/11/12 0511  NA 138  K 3.6  CL 107  CO2 22  GLUCOSE 138*  BUN 11  CREATININE 0.80  CALCIUM 7.6*   PT/INR No results found for this basename: LABPROT:2,INR:2 in the last 72 hours ABG No results found for this basename: PHART:2,PCO2:2,PO2:2,HCO3:2 in the last 72 hours  Studies/Results: Dg Chest 1 View  02/12/2012  *RADIOLOGY REPORT*  Clinical Data: Right thoracentesis  CHEST - 1 VIEW  Comparison: 02/07/2012  Findings: Small bilateral pleural effusions persist, with less pleural fluid on the right than seen previously.  No pneumothorax. Basilar atelectasis persists.  IMPRESSION: No pneumothorax following right thoracentesis.  Less pleural fluid on the right.   Original Report Authenticated By: Thomasenia Sales, M.D.    Ct Abdomen Pelvis W Contrast  02/11/2012  *RADIOLOGY REPORT*  Clinical Data: 66 year old female status post abscess drainage. Crohn disease.  CT ABDOMEN AND PELVIS WITH CONTRAST  Technique:  Multidetector CT imaging of  the abdomen and pelvis was performed following the standard protocol during bolus administration of intravenous contrast.  Contrast: OMNIPAQUE IOHEXOL 300 MG/ML  SOLN  Comparison: 02/07/2012 CT guided abscess drainage.  Preoperative study 02/06/2012.  Findings: Increased layering right pleural effusion, now moderate to large.  Stable small to moderate layering left pleural effusion. No pericardial effusion.  Associated lower lobe collapse.  Stable visualized osseous structures.  Stable left adnexal cyst.  Stable mild presacral stranding or trace fluid.  Fluid in the rectum.  Uterus and right adnexa are unremarkable.  Continued mild wall thickening of the sigmoid colon and descending colon.  Oral contrast has reached the splenic flexure.  Percutaneous pigtail drainage catheter in place at the level of the midline ventral peritoneal cavity abscess which has decreased.  Small volume residual fluid noted (series 2 image 52). There is no longer gas collection.  Postoperative changes to the right colon.  Severe stranding in the right abdominal mesentery and involving right abdominal small bowel has not significantly changed.  Stable small bowel caliber.  Severe thickening and inflammation of the gastric antrum and proximal duodenum has not significantly changed.  Stranding at the porta hepatis and about the gallbladder is stable.  Small volume.  Panic fluid appears similar.  No definite liver abscess.  Mildly increased oval hypodensity in the spleen on series 2 image 19.  Previously this was felt to be infarct.  There are several new smaller similar areas in the spleen, mostly along the periphery.  Negative pancreas.  Stable adrenal hyperplasia.  Stable kidneys without hydronephrosis.  Punctate nephrolithiasis.  Portal venous system is patent.  Major arterial structures in the abdomen pelvis are patent.  Open ventral abdominal wound is stable.  IMPRESSION: 1.  Interval decreased ventral peritoneal cavity abscess  with percutaneous pigtail drain in place.  Small volume residual abscess fluid. 2.  Increased peripheral hypodense areas in the spleen.  Previously these were thought to be infarcts.  No findings highly suspicious for splenic abscess at this time. 3.  Increased right pleural effusion now moderate to large.  Stable small to moderate left pleural effusion. 4.  Trace perihepatic fluid.  No definite liver abscess. 5.  Extensive inflammation in the right abdomen has not significantly changed. 6.  No bowel obstruction.   Original Report Authenticated By: Harley Hallmark, M.D.    US Thoracentesis Asp Pleural Space W/img Guide  02/12/2012  *RADIOLOGY REPORT*  Clinical Data:  Patient with history of Crohn's disease, status post bowel resection.  Now with moderately large right pleural effusion.  Request is made for diagnostic and therapeutic right thoracentesis.  ULTRASOUND GUIDED DIAGNOSTIC AND THERAPEUTIC RIGHT  THORACENTESIS  An ultrasound guided thoracentesis was thoroughly discussed with the patient and questions answered.  The benefits, risks, alternatives and complications were also discussed.  The patient understands and wishes to proceed with the procedure.  Written consent was obtained.  Ultrasound was performed to localize and mark an adequate pocket of fluid in the right chest.  The area was then prepped and draped in the normal sterile fashion.  1% Lidocaine was used for local anesthesia.  Under ultrasound guidance a 19 gauge Yueh catheter was introduced.  Thoracentesis was performed.  The catheter was removed and a dressing applied.  Complications:  none  Findings: A total of approximately 1.1 liters of slightly turbid, yellow fluid was removed. A fluid sample was sent for laboratory analysis.  IMPRESSION: Successful ultrasound guided diagnostic and therapeutic right thoracentesis yielding 1.1 liters of pleural fluid.  Read by: Jeananne Rama, P.A.-C   Original Report Authenticated By: Reola Calkins, M.D.      Anti-infectives: Anti-infectives     Start     Dose/Rate Route Frequency Ordered Stop   02/08/12 1400   ceFEPIme (MAXIPIME) 1 g in dextrose 5 % 50 mL IVPB  Status:  Discontinued        1 g 100 mL/hr over 30 Minutes Intravenous 3 times per day 02/08/12 1137 02/08/12 1141   02/08/12 1400   piperacillin-tazobactam (ZOSYN) IVPB 3.375 g        3.375 g 12.5 mL/hr over 240 Minutes Intravenous Every 8 hours 02/08/12 1145     02/08/12 1300   levofloxacin (LEVAQUIN) IVPB 750 mg  Status:  Discontinued        750 mg 100 mL/hr over 90 Minutes Intravenous Every 24 hours 02/08/12 1137 02/08/12 1141   02/06/12 1800   vancomycin (VANCOCIN) IVPB 1000 mg/200 mL premix  Status:  Discontinued        1,000 mg 200 mL/hr over 60 Minutes Intravenous Every 12 hours 02/06/12 1448 02/08/12 1133   02/06/12 1445   ceFEPIme (MAXIPIME) 1 g in dextrose 5 % 50 mL IVPB  Status:  Discontinued        1 g 100 mL/hr over 30 Minutes Intravenous 3 times per day 02/06/12 1431 02/08/12 1137   02/06/12 1445   levofloxacin (LEVAQUIN) IVPB 750 mg  Status:  Discontinued        750 mg  100 mL/hr over 90 Minutes Intravenous Every 24 hours 02/06/12 1431 02/08/12 1137          Assessment/Plan: 1. POD#20-Exploratory laparotomy, Extensive lysis of adhesions, Resection of ileocolonic anastomosis with perforation, Ileocolonic anastomosis between mid ileum and proximal transverse colon 10/5: presented this admission with fevers and abdominal abscess, now s/p perc drain, culture with no growth thus far, CT showed decrease in abscess fluid collection  --Continue regular diet  --OOB and ambulate in hall  --dressing changes to abd wound twice daily with wet to dry  --continue zosyn and vanc until culture results are back from pleural tap  2. Large Right pleural effusion and small left: no sob or wheezing  --IS  --OOB and coughing teaching. 3. Hematochezia  --possibly related to surgery, no tachycardia or BRBPR to suggest brisk  bleeding  --will monitor Hgb   Dispo: patient wants to go home but will need 24/7 supervision.  Care management has evaluated patient and appropriate arrangements can be made for 24/7 supervision and [facility] PT/RN.  Pt for pleural effusion tap today and cs for fluid.  Likely this will make discharge over the weekend.  Pt cannot go home on Saturday due to supervision issues which would make discharge on Sunday.  Arranges ready for d/c Sunday but will need abx coverage for pleural collection, will see what gram stain shows.  Also abx for abd abscess.  Needs follow up in 2 weeks.     LOS: 7 days    Cassie Henkels C. 02/13/2012

## 2012-02-13 NOTE — Progress Notes (Signed)
Subjective: abd wall abscess drain placed 10/20 10/24 CT shows smaller but not resolved  Objective: Vital signs in last 24 hours: Temp:  [98.6 F (37 C)-100.9 F (38.3 C)] 99.1 F (37.3 C) (10/26 0645) Pulse Rate:  [62-79] 79  (10/26 0645) Resp:  [18-22] 18  (10/26 0645) BP: (142-163)/(61-83) 161/61 mmHg (10/26 0645) SpO2:  [94 %-99 %] 94 % (10/26 0645) Last BM Date: 02/11/12  Intake/Output from previous day: 10/25 0701 - 10/26 0700 In: 329.3 [I.V.:279.3; IV Piggyback:50] Out: 600 [Urine:600] Intake/Output this shift:    PE:  Low grade temp T max 100.9 10/25 Output 15 cc 10/25; 20 cc in bag now: yellow thin fluid Site clean and dry; nt Cx neg Wbc 13.7 10/25  Lab Results:   Basename 02/12/12 0502  WBC 13.7*  HGB 8.9*  HCT 27.0*  PLT 525*   BMET  Basename 02/11/12 0511  NA 138  K 3.6  CL 107  CO2 22  GLUCOSE 138*  BUN 11  CREATININE 0.80  CALCIUM 7.6*   PT/INR No results found for this basename: LABPROT:2,INR:2 in the last 72 hours ABG No results found for this basename: PHART:2,PCO2:2,PO2:2,HCO3:2 in the last 72 hours  Studies/Results: Dg Chest 1 View  02/12/2012  *RADIOLOGY REPORT*  Clinical Data: Right thoracentesis  CHEST - 1 VIEW  Comparison: 02/07/2012  Findings: Small bilateral pleural effusions persist, with less pleural fluid on the right than seen previously.  No pneumothorax. Basilar atelectasis persists.  IMPRESSION: No pneumothorax following right thoracentesis.  Less pleural fluid on the right.   Original Report Authenticated By: Thomasenia Sales, M.D.    Ct Abdomen Pelvis W Contrast  02/11/2012  *RADIOLOGY REPORT*  Clinical Data: 66 year old female status post abscess drainage. Crohn disease.  CT ABDOMEN AND PELVIS WITH CONTRAST  Technique:  Multidetector CT imaging of the abdomen and pelvis was performed following the standard protocol during bolus administration of intravenous contrast.  Contrast: OMNIPAQUE IOHEXOL 300 MG/ML  SOLN   Comparison: 02/07/2012 CT guided abscess drainage.  Preoperative study 02/06/2012.  Findings: Increased layering right pleural effusion, now moderate to large.  Stable small to moderate layering left pleural effusion. No pericardial effusion.  Associated lower lobe collapse.  Stable visualized osseous structures.  Stable left adnexal cyst.  Stable mild presacral stranding or trace fluid.  Fluid in the rectum.  Uterus and right adnexa are unremarkable.  Continued mild wall thickening of the sigmoid colon and descending colon.  Oral contrast has reached the splenic flexure.  Percutaneous pigtail drainage catheter in place at the level of the midline ventral peritoneal cavity abscess which has decreased.  Small volume residual fluid noted (series 2 image 52). There is no longer gas collection.  Postoperative changes to the right colon.  Severe stranding in the right abdominal mesentery and involving right abdominal small bowel has not significantly changed.  Stable small bowel caliber.  Severe thickening and inflammation of the gastric antrum and proximal duodenum has not significantly changed.  Stranding at the porta hepatis and about the gallbladder is stable.  Small volume.  Panic fluid appears similar.  No definite liver abscess.  Mildly increased oval hypodensity in the spleen on series 2 image 19.  Previously this was felt to be infarct.  There are several new smaller similar areas in the spleen, mostly along the periphery.  Negative pancreas.  Stable adrenal hyperplasia.  Stable kidneys without hydronephrosis.  Punctate nephrolithiasis.  Portal venous system is patent.  Major arterial structures in the abdomen  pelvis are patent.  Open ventral abdominal wound is stable.  IMPRESSION: 1.  Interval decreased ventral peritoneal cavity abscess with percutaneous pigtail drain in place.  Small volume residual abscess fluid. 2.  Increased peripheral hypodense areas in the spleen.  Previously these were thought to be  infarcts.  No findings highly suspicious for splenic abscess at this time. 3.  Increased right pleural effusion now moderate to large.  Stable small to moderate left pleural effusion. 4.  Trace perihepatic fluid.  No definite liver abscess. 5.  Extensive inflammation in the right abdomen has not significantly changed. 6.  No bowel obstruction.   Original Report Authenticated By: Harley Hallmark, M.D.    US Thoracentesis Asp Pleural Space W/img Guide  02/12/2012  *RADIOLOGY REPORT*  Clinical Data:  Patient with history of Crohn's disease, status post bowel resection.  Now with moderately large right pleural effusion.  Request is made for diagnostic and therapeutic right thoracentesis.  ULTRASOUND GUIDED DIAGNOSTIC AND THERAPEUTIC RIGHT  THORACENTESIS  An ultrasound guided thoracentesis was thoroughly discussed with the patient and questions answered.  The benefits, risks, alternatives and complications were also discussed.  The patient understands and wishes to proceed with the procedure.  Written consent was obtained.  Ultrasound was performed to localize and mark an adequate pocket of fluid in the right chest.  The area was then prepped and draped in the normal sterile fashion.  1% Lidocaine was used for local anesthesia.  Under ultrasound guidance a 19 gauge Yueh catheter was introduced.  Thoracentesis was performed.  The catheter was removed and a dressing applied.  Complications:  none  Findings: A total of approximately 1.1 liters of slightly turbid, yellow fluid was removed. A fluid sample was sent for laboratory analysis.  IMPRESSION: Successful ultrasound guided diagnostic and therapeutic right thoracentesis yielding 1.1 liters of pleural fluid.  Read by: Jeananne Rama, P.A.-C   Original Report Authenticated By: Reola Calkins, M.D.     Anti-infectives:   Assessment/Plan: s/p * No surgery found *   LOS: 7 days   abd wall abscess drain placed 10/20 Output still significant Will  follow  Danali Marinos A 02/13/2012

## 2012-02-13 NOTE — Progress Notes (Signed)
Continue abx for abdominal abscess until infection parameters are normal No abx needed for pleural effusion

## 2012-02-14 ENCOUNTER — Inpatient Hospital Stay (HOSPITAL_COMMUNITY): Payer: Medicare Other

## 2012-02-14 LAB — CBC
Hemoglobin: 9.1 g/dL — ABNORMAL LOW (ref 12.0–15.0)
MCH: 26.6 pg (ref 26.0–34.0)
MCHC: 32.5 g/dL (ref 30.0–36.0)
Platelets: 500 10*3/uL — ABNORMAL HIGH (ref 150–400)

## 2012-02-14 LAB — URINALYSIS, ROUTINE W REFLEX MICROSCOPIC
Bilirubin Urine: NEGATIVE
Leukocytes, UA: NEGATIVE
Nitrite: NEGATIVE
Protein, ur: 100 mg/dL — AB
Urobilinogen, UA: 0.2 mg/dL (ref 0.0–1.0)

## 2012-02-14 LAB — CLOSTRIDIUM DIFFICILE BY PCR: Toxigenic C. Difficile by PCR: NEGATIVE

## 2012-02-14 MED ORDER — METRONIDAZOLE IN NACL 5-0.79 MG/ML-% IV SOLN
500.0000 mg | Freq: Four times a day (QID) | INTRAVENOUS | Status: DC
  Administered 2012-02-14 – 2012-02-16 (×8): 500 mg via INTRAVENOUS
  Filled 2012-02-14 (×10): qty 100

## 2012-02-14 NOTE — Progress Notes (Signed)
Patient ID: Michele David, female   DOB: 25-Apr-1945, 66 y.o.   MRN: [id number]    Subjective: No complaints, +flatus and bloody BMs, denies fevers or chills, s/p thorocentesis Fri, c/o pain at that site and more abd pain last night  Objective: Vital signs in last 24 hours: Temp:  [98.6 F (37 C)-99.7 F (37.6 C)] 98.7 F (37.1 C) (10/27 0545) Pulse Rate:  [69-74] 74  (10/27 0545) Resp:  [16-18] 16  (10/27 0545) BP: (147-161)/(66-77) 147/67 mmHg (10/27 0545) SpO2:  [93 %-95 %] 93 % (10/27 0545) Weight:  [158 lb 8.2 oz (71.9 kg)] 158 lb 8.2 oz (71.9 kg) (10/26 1213) Last BM Date: 02/13/12  Intake/Output from previous day: 10/26 0701 - 10/27 0700 In: 480 [P.O.:360; I.V.:120] Out: 600 [Urine:600] Intake/Output this shift: Total I/O In: 240 [P.O.:240] Out: -  Physical Exam: Cardiac: RRR Lungs: CTA, decreased breath sounds in R base GI: soft, minimal tenderness. wound clean  Drain with mildly cloudy serous output.  Lab Results:   Basename 02/14/12 0536 02/12/12 0502  WBC 20.6* 13.7*  HGB 9.1* 8.9*  HCT 28.0* 27.0*  PLT 500* 525*   BMET No results found for this basename: NA:2,K:2,CL:2,CO2:2,GLUCOSE:2,BUN:2,CREATININE:2,CALCIUM:2 in the last 72 hours PT/INR No results found for this basename: LABPROT:2,INR:2 in the last 72 hours ABG No results found for this basename: PHART:2,PCO2:2,PO2:2,HCO3:2 in the last 72 hours  Studies/Results: Dg Chest 1 View  02/12/2012  *RADIOLOGY REPORT*  Clinical Data: Right thoracentesis  CHEST - 1 VIEW  Comparison: 02/07/2012  Findings: Small bilateral pleural effusions persist, with less pleural fluid on the right than seen previously.  No pneumothorax. Basilar atelectasis persists.  IMPRESSION: No pneumothorax following right thoracentesis.  Less pleural fluid on the right.   Original Report Authenticated By: Thomasenia Sales, M.D.    US Thoracentesis Asp Pleural Space W/img Guide  02/12/2012  *RADIOLOGY REPORT*  Clinical Data:   Patient with history of Crohn's disease, status post bowel resection.  Now with moderately large right pleural effusion.  Request is made for diagnostic and therapeutic right thoracentesis.  ULTRASOUND GUIDED DIAGNOSTIC AND THERAPEUTIC RIGHT  THORACENTESIS  An ultrasound guided thoracentesis was thoroughly discussed with the patient and questions answered.  The benefits, risks, alternatives and complications were also discussed.  The patient understands and wishes to proceed with the procedure.  Written consent was obtained.  Ultrasound was performed to localize and mark an adequate pocket of fluid in the right chest.  The area was then prepped and draped in the normal sterile fashion.  1% Lidocaine was used for local anesthesia.  Under ultrasound guidance a 19 gauge Yueh catheter was introduced.  Thoracentesis was performed.  The catheter was removed and a dressing applied.  Complications:  none  Findings: A total of approximately 1.1 liters of slightly turbid, yellow fluid was removed. A fluid sample was sent for laboratory analysis.  IMPRESSION: Successful ultrasound guided diagnostic and therapeutic right thoracentesis yielding 1.1 liters of pleural fluid.  Read by: Jeananne Rama, P.A.-C   Original Report Authenticated By: Reola Calkins, M.D.     Anti-infectives: Anti-infectives     Start     Dose/Rate Route Frequency Ordered Stop   02/14/12 0930   metroNIDAZOLE (FLAGYL) IVPB 500 mg        500 mg 100 mL/hr over 60 Minutes Intravenous Every 6 hours 02/14/12 0924     02/08/12 1400   ceFEPIme (MAXIPIME) 1 g in dextrose 5 % 50 mL IVPB  Status:  Discontinued        1 g 100 mL/hr over 30 Minutes Intravenous 3 times per day 02/08/12 1137 02/08/12 1141   02/08/12 1400   piperacillin-tazobactam (ZOSYN) IVPB 3.375 g        3.375 g 12.5 mL/hr over 240 Minutes Intravenous Every 8 hours 02/08/12 1145     02/08/12 1300   levofloxacin (LEVAQUIN) IVPB 750 mg  Status:  Discontinued        750 mg 100  mL/hr over 90 Minutes Intravenous Every 24 hours 02/08/12 1137 02/08/12 1141   02/06/12 1800   vancomycin (VANCOCIN) IVPB 1000 mg/200 mL premix  Status:  Discontinued        1,000 mg 200 mL/hr over 60 Minutes Intravenous Every 12 hours 02/06/12 1448 02/08/12 1133   02/06/12 1445   ceFEPIme (MAXIPIME) 1 g in dextrose 5 % 50 mL IVPB  Status:  Discontinued        1 g 100 mL/hr over 30 Minutes Intravenous 3 times per day 02/06/12 1431 02/08/12 1137   02/06/12 1445   levofloxacin (LEVAQUIN) IVPB 750 mg  Status:  Discontinued        750 mg 100 mL/hr over 90 Minutes Intravenous Every 24 hours 02/06/12 1431 02/08/12 1137          Assessment/Plan: 1. POD#20-Exploratory laparotomy, Extensive lysis of adhesions, Resection of ileocolonic anastomosis with perforation, Ileocolonic anastomosis between mid ileum and proximal transverse colon 10/5: presented this admission with fevers and abdominal abscess, now s/p perc drain, culture with no growth thus far, CT showed decrease in abscess fluid collection  --Continue regular diet  --OOB and ambulate in hall  --dressing changes to abd wound twice daily with wet to dry  --continue zosyn until culture results are back from pleural tap.  Will add flagyl for empiric c diff coverage given elevated wbc, diarrhea and abd pain.  C diff PCR pend  --will check UA 2. Large Right pleural effusion and small left: no sob or wheezing  --IS  --OOB and coughing teaching.  --will check CXR given elevated WBC 3. Hematochezia  --possibly related to surgery, no tachycardia or BRBPR to suggest brisk bleeding  --Hgb stable   Dispo: patient wants to go home but will need 24/7 supervision.  Care management has evaluated patient and appropriate arrangements can be made for 24/7 supervision and [facility] PT/RN.  Pt for pleural effusion tap Friday and cs for fluid pending.  No discharge today given elevated wbc and new abd pain. Will need abx for abd abscess.  Needs follow up in 2  weeks.     LOS: 8 days    Merced Hanners C. 02/14/2012

## 2012-02-14 NOTE — Progress Notes (Signed)
ANTIBIOTIC CONSULT NOTE - Follow Up  Pharmacy Consult for Zosyn Indication: Abdominal abscess  Allergies  Allergen Reactions  . Tomato Other (See Comments)    Reaction: caused bumps in her mouth    Patient Measurements: Height: 5\' 2"  (157.5 cm) Weight: 158 lb 8.2 oz (71.9 kg) IBW/kg (Calculated) : 50.1   Vital Signs: Temp: 98.7 F (37.1 C) (10/27 0545) Temp src: Oral (10/27 0545) BP: 147/67 mmHg (10/27 0545) Pulse Rate: 74  (10/27 0545) Intake/Output from previous day: 10/26 0701 - 10/27 0700 In: 480 [P.O.:360; I.V.:120] Out: 600 [Urine:600] Intake/Output from this shift: Total I/O In: 246 [P.O.:240; Other:6] Out: -   Labs:  Basename 02/14/12 0536 02/12/12 0502  WBC 20.6* 13.7*  HGB 9.1* 8.9*  PLT 500* 525*  LABCREA -- --  CREATININE -- --   Estimated Creatinine Clearance: 63.3 ml/min (by C-G formula based on Cr of 0.8). No results found for this basename: VANCOTROUGH:2,VANCOPEAK:2,VANCORANDOM:2,GENTTROUGH:2,GENTPEAK:2,GENTRANDOM:2,TOBRATROUGH:2,TOBRAPEAK:2,TOBRARND:2,AMIKACINPEAK:2,AMIKACINTROU:2,AMIKACIN:2, in the last 72 hours   Medical History: Past Medical History  Diagnosis Date  . Bowel perforation   . Crohn disease   . Hypertension   . Hyperlipemia   . Swelling of extremity, left   . Swelling of extremity, right    Microbiology: 10/19 urine >> 20K colonies/ml multiple morphotypes 10/20 abscess drain >> ng-F 10/20: MRSA pcr >> negative 10/25: Fluid from pleural effusion tap >> ngtd 10/27: Cdiff: sent   Assessment:  9 YOF admitted from SNF with fever.  Pt recently discharged s/p ileocecectomy for Crohn's disease with perforation.  Found to have abdominal abscess beneath incision, perc drain placed 10/20.  Patient empirically started on vancomycin/cefepime/levaquin 10/19 for questionable HCAP as fever source, switched therapy to Zosyn for empiric treatment for abdominal abscess at that point.    Day #7 Zosyn for abdominal abscess.  R  thoracentesis 10/25 removed 1.1L and fluid sent for cx. Per surgery, continue zosyn until culture results are back from pleural tap.  WBC trending up and pt has diarrhea - MD added Flagyl for r/o C.diff today.  Renal function stable, CrCl>20 ml/min.  Goal of Therapy:  doses adjusted per renal clearance  Plan:  Continue Zosyn 3.375g IV q8h (4 hour infusion time).  LOT per MD. F/u cultures and renal function.   [name], PharmD, BCPS Pager: [phone] 02/14/2012 11:44 AM

## 2012-02-15 LAB — CBC
HCT: 28.5 % — ABNORMAL LOW (ref 36.0–46.0)
MCH: 27.2 pg (ref 26.0–34.0)
MCHC: 33 g/dL (ref 30.0–36.0)
MCV: 82.4 fL (ref 78.0–100.0)
Platelets: 420 10*3/uL — ABNORMAL HIGH (ref 150–400)
RDW: 15.9 % — ABNORMAL HIGH (ref 11.5–15.5)

## 2012-02-15 MED ORDER — ENSURE COMPLETE PO LIQD: 237.0000 mL | Freq: Two times a day (BID) | ORAL | Status: DC

## 2012-02-15 NOTE — Significant Event (Signed)
Dr.  Luisa Hart paged for BP 177/67.Marland KitchenMarland Kitchen

## 2012-02-15 NOTE — Progress Notes (Signed)
INITIAL ADULT NUTRITION ASSESSMENT Date: 02/15/2012   Time: 3:27 PM Reason for Assessment: Nutrition risk  INTERVENTION: Cold chocolate Ensure Complete BID. Encouraged gradual increased meal intake and for pt to select high calorie/protein cold sweet items on the menu which were discussed. Will monitor.   ASSESSMENT: Female 66 y.o.  Dx: Abdominal abscess  Food/Nutrition Related Hx: Pt to known to RD from previous admission, was discharged 02/03/12. Pt had reported at that time excellent intake and stable weight, however pt currently reports losing her appetite for the past few days. Pt reports all she has wanted has been cold, sweet beverages. Pt reports having some soda and jello this morning and bites of sandwich/soup today at lunch. Discussed nutritional supplements with pt who was agreeable to getting Ensure Complete BID.   Hx:  Past Medical History  Diagnosis Date  . Bowel perforation   . Crohn disease   . Hypertension   . Hyperlipemia   . Swelling of extremity, left   . Swelling of extremity, right    Related Meds:  Scheduled Meds:   . acetaminophen  650 mg Rectal Once  . antiseptic oral rinse  15 mL Mouth Rinse BID  . heparin  5,000 Units Subcutaneous Q8H  . metronidazole  500 mg Intravenous Q6H  . pantoprazole (PROTONIX) IV  40 mg Intravenous QHS  . piperacillin-tazobactam (ZOSYN)  IV  3.375 g Intravenous Q8H   Continuous Infusions:   . dextrose 5 % and 0.9 % NaCl with KCl 20 mEq/L 20 mL/hr (02/09/12 1010)   PRN Meds:.fentaNYL  Ht: 5\' 2"  (157.5 cm)  Wt: 158 lb 8.2 oz (71.9 kg)  Ideal Wt: 110 lb % Ideal Wt: 144  Usual Wt: 140-150 lb % Usual Wt: 105-113  Body mass index is 28.99 kg/(m^2).   Labs:  CMP     Component Value Date/Time   NA 138 02/11/2012 0511   K 3.6 02/11/2012 0511   CL 107 02/11/2012 0511   CO2 22 02/11/2012 0511   GLUCOSE 138* 02/11/2012 0511   BUN 11 02/11/2012 0511   CREATININE 0.80 02/11/2012 0511   CALCIUM 7.6* 02/11/2012 0511     PROT 5.5* 02/09/2012 0345   ALBUMIN 1.5* 02/09/2012 0345   AST 18 02/09/2012 0345   ALT 26 02/09/2012 0345   ALKPHOS 135* 02/09/2012 0345   BILITOT 0.6 02/09/2012 0345   GFRNONAA 75* 02/11/2012 0511   GFRAA 86* 02/11/2012 0511    Intake/Output Summary (Last 24 hours) at 02/15/12 1536 Last data filed at 02/15/12 0600  Gross per 24 hour  Intake    125 ml  Output    250 ml  Net   -125 ml   Last BM - 10/28  Diet Order: General   IVF:    dextrose 5 % and 0.9 % NaCl with KCl 20 mEq/L Last Rate: 20 mL/hr (02/09/12 1010)    Estimated Nutritional Needs:   Kcal:1750-1950 Protein:85-105g Fluid:1.7-1.9L  NUTRITION DIAGNOSIS: -Inadequate oral intake (NI-2.1).  Status: Ongoing  RELATED TO: poor appetite  AS EVIDENCE BY: <25% meal intake  MONITORING/EVALUATION(Goals): Pt to consume >75% of meals/supplements.  EDUCATION NEEDS: -No education needs identified at this time   Dietitian #: [phone]  DOCUMENTATION CODES Per approved criteria  -Not Applicable    Marshall Cork 02/15/2012, 3:27 PM

## 2012-02-15 NOTE — Progress Notes (Signed)
Patient given ice, gingerale and crackers did not feel like eating her dinner.  Assisted out of bed to recliner, tolerated the few steps with assistance to the chair well.

## 2012-02-15 NOTE — Progress Notes (Signed)
Physical Therapy Treatment Patient Details Name: Michele David MRN: [id number] DOB: 1945/11/27 Today's Date: 02/15/2012 Time: 1450-1510 PT Time Calculation (min): 20 min  PT Assessment / Plan / Recommendation Comments on Treatment Session  Pt reporting 10/10 R flank pain and RN in to give IV med prior to ambulation.  Pt coughing up secretions during session.  SaO2 on room air 98% upon returning to supine position.     Follow Up Recommendations  Post acute inpatient;Home health PT;Supervision/Assistance - 24 hour     Does the patient have the potential to tolerate intense rehabilitation  No, Recommend SNF  Barriers to Discharge        Equipment Recommendations  Rolling walker with 5" wheels;3 in 1 bedside comode    Recommendations for Other Services    Frequency     Plan Discharge plan remains appropriate;Frequency remains appropriate    Precautions / Restrictions Precautions Precautions: Fall Precaution Comments: abd drain R   Pertinent Vitals/Pain 10/10 R flank pain, RN gave IV med    Mobility  Bed Mobility Bed Mobility: Supine to Sit Supine to Sit: HOB elevated;With rails;4: Min assist Details for Bed Mobility Assistance: assist for trunk, verbal cues for technique Transfers Transfers: Stand to Sit;Sit to Stand Sit to Stand: 4: Min assist;With upper extremity assist;From bed;From elevated surface Stand to Sit: To bed;4: Min assist Details for Transfer Assistance: verbal cues for safe technique, hand placement, assist to rise and steady and control descent Ambulation/Gait Ambulation/Gait Assistance: 4: Min assist Ambulation Distance (Feet): 100 Feet Assistive device: Rolling walker Ambulation/Gait Assistance Details: assist to steady occasionally, fatigues quickly, SaO2 100% room air pregait and 98% upon returning to room Gait Pattern: Step-through pattern;Decreased stride length;Trunk flexed Gait velocity: decreased    Exercises     PT Diagnosis:    PT  Problem List:   PT Treatment Interventions:     PT Goals Acute Rehab PT Goals PT Goal: Supine/Side to Sit - Progress: Progressing toward goal PT Goal: Sit to Supine/Side - Progress: Progressing toward goal PT Goal: Sit to Stand - Progress: Progressing toward goal PT Goal: Stand to Sit - Progress: Progressing toward goal PT Goal: Ambulate - Progress: Progressing toward goal  Visit Information  Last PT Received On: 02/15/12 Assistance Needed: +1    Subjective Data  Subjective: I know I need to walk.   Cognition  Overall Cognitive Status: Appears within functional limits for tasks assessed/performed Arousal/Alertness: Awake/alert Orientation Level: Appears intact for tasks assessed Behavior During Session: [facility] for tasks performed    Balance     End of Session PT - End of Session Activity Tolerance: Patient limited by fatigue Patient left: in bed;with call bell/phone within reach   GP     [facility] E 02/15/2012, 3:23 PM Pager: [phone]

## 2012-02-15 NOTE — Progress Notes (Signed)
Subjective: Not much appetite.    Objective: Vital signs in last 24 hours: Temp:  [99.3 F (37.4 C)-100 F (37.8 C)] 99.3 F (37.4 C) (10/28 0600) Pulse Rate:  [69-78] 76  (10/28 0600) Resp:  [18] 18  (10/28 0600) BP: (167-177)/(67-74) 177/67 mmHg (10/28 0600) SpO2:  [94 %-95 %] 94 % (10/28 0600) Last BM Date: 02/15/12  Intake/Output from previous day: 10/27 0701 - 10/28 0700 In: 491 [P.O.:360; I.V.:120] Out: 250 [Urine:250] Intake/Output this shift:    General appearance: alert, cooperative and no distress Resp: nonlabored Cardio: normal rate, regular, +murmur GI: nondistended, wound packed, clean with some fibrinous exudate, drain with serous output. right sided tenderness, no peritonitis  Lab Results:   Basename 02/15/12 0650 02/14/12 0536  WBC 17.0* 20.6*  HGB 9.4* 9.1*  HCT 28.5* 28.0*  PLT 420* 500*   BMET No results found for this basename: NA:2,K:2,CL:2,CO2:2,GLUCOSE:2,BUN:2,CREATININE:2,CALCIUM:2 in the last 72 hours PT/INR No results found for this basename: LABPROT:2,INR:2 in the last 72 hours ABG No results found for this basename: PHART:2,PCO2:2,PO2:2,HCO3:2 in the last 72 hours  Studies/Results: Dg Chest Port 1 View  02/14/2012  *RADIOLOGY REPORT*  Clinical Data: 66 year old female with cough, elevated white count. Recent thoracentesis.  PORTABLE CHEST - 1 VIEW  Comparison: 02/12/2012  Findings: Upper limits normal heart size again noted. Pulmonary vascular congestion is again noted. There appears to be slight increase in the small right pleural effusion. Bibasilar atelectasis is noted. There is no evidence of pneumothorax or definite pulmonary edema.  IMPRESSION: Apparent slight increase in small right pleural effusion.  Continued mild pulmonary vascular congestion and bibasilar atelectasis.   Original Report Authenticated By: Rosendo Gros, M.D.     Anti-infectives: Anti-infectives     Start     Dose/Rate Route Frequency Ordered Stop   02/14/12  1000   metroNIDAZOLE (FLAGYL) IVPB 500 mg        500 mg 100 mL/hr over 60 Minutes Intravenous Every 6 hours 02/14/12 0924     02/08/12 1400   ceFEPIme (MAXIPIME) 1 g in dextrose 5 % 50 mL IVPB  Status:  Discontinued        1 g 100 mL/hr over 30 Minutes Intravenous 3 times per day 02/08/12 1137 02/08/12 1141   02/08/12 1400  piperacillin-tazobactam (ZOSYN) IVPB 3.375 g       3.375 g 12.5 mL/hr over 240 Minutes Intravenous Every 8 hours 02/08/12 1145     02/08/12 1300   levofloxacin (LEVAQUIN) IVPB 750 mg  Status:  Discontinued        750 mg 100 mL/hr over 90 Minutes Intravenous Every 24 hours 02/08/12 1137 02/08/12 1141   02/06/12 1800   vancomycin (VANCOCIN) IVPB 1000 mg/200 mL premix  Status:  Discontinued        1,000 mg 200 mL/hr over 60 Minutes Intravenous Every 12 hours 02/06/12 1448 02/08/12 1133   02/06/12 1445   ceFEPIme (MAXIPIME) 1 g in dextrose 5 % 50 mL IVPB  Status:  Discontinued        1 g 100 mL/hr over 30 Minutes Intravenous 3 times per day 02/06/12 1431 02/08/12 1137   02/06/12 1445   levofloxacin (LEVAQUIN) IVPB 750 mg  Status:  Discontinued        750 mg 100 mL/hr over 90 Minutes Intravenous Every 24 hours 02/06/12 1431 02/08/12 1137          Assessment/Plan: s/p * No surgery found * Her main complaints are pulmonary.  She is breathing okay.  Will do continuous pulse oximetry and check xray. This could be a pneumonia causing the breathing issues and elevated wbc. C. Diff negative.  Trend WBC and consider d/c home when breathing improves and increased po intake.    LOS: 9 days    Lodema Pilot DAVID 02/15/2012

## 2012-02-16 ENCOUNTER — Inpatient Hospital Stay (HOSPITAL_COMMUNITY): Payer: Medicare Other

## 2012-02-16 LAB — BODY FLUID CULTURE: Special Requests: NORMAL

## 2012-02-16 MED ORDER — FUROSEMIDE 20 MG PO TABS
20.0000 mg | ORAL_TABLET | Freq: Every day | ORAL | Status: DC
  Administered 2012-02-16 – 2012-03-06 (×18): 20 mg via ORAL
  Filled 2012-02-16 (×21): qty 1

## 2012-02-16 MED ORDER — FAMOTIDINE 20 MG PO TABS
20.0000 mg | ORAL_TABLET | Freq: Two times a day (BID) | ORAL | Status: DC
  Administered 2012-02-16 – 2012-02-20 (×9): 20 mg via ORAL
  Filled 2012-02-16 (×10): qty 1

## 2012-02-16 NOTE — Progress Notes (Addendum)
Physical Therapy Treatment Patient Details Name: Michele David MRN: [id number] DOB: January 09, 1946 Today's Date: 02/16/2012 Time: 8657-8469 PT Time Calculation (min): 27 min  PT Assessment / Plan / Recommendation Comments on Treatment Session  Continues to demonsrate general weakness and decreased activity tolerance. Will need to begin practicing stair negotiation with pt since she plans to stay on 2nd level. Continue to recommend pt live on 1st level, if possible, until strength and activity tolerance improve.     Follow Up Recommendations  Post acute inpatient;Home health PT;Supervision/Assistance - 24 hour     Does the patient have the potential to tolerate intense rehabilitation  No, Recommend SNF  Barriers to Discharge        Equipment Recommendations  Rolling walker with 5" wheels;3 in 1 bedside comode    Recommendations for Other Services    Frequency Min 3X/week   Plan Discharge plan remains appropriate    Precautions / Restrictions Precautions Precautions: Fall Restrictions Weight Bearing Restrictions: No   Pertinent Vitals/Pain R flank-sore. Medicated just prior to session.     Mobility  Bed Mobility Bed Mobility: Supine to Sit;Sit to Supine Supine to Sit: HOB elevated;4: Min assist Sit to Supine: 4: Min guard Details for Bed Mobility Assistance: 1 handheld assist for trunk to upright. Increased time.  Transfers Transfers: Sit to Stand;Stand to [employer] Transfers Sit to Stand: 4: Min guard;From bed;With upper extremity assist Stand to Sit: 4: Min guard;To bed;With upper extremity assist Details for Transfer Assistance: x 2. VCs safety, hand placement. Min-guard assist for stand pivot bed<>bsc.  Ambulation/Gait Ambulation/Gait Assistance: 4: Min assist Ambulation Distance (Feet): 80 Feet (80' x 2. ) Assistive device: Rolling walker Ambulation/Gait Assistance Details: Assist to steady intermittently. Continues to fatigue quickly. O2 sats 95% on RA  after gait. Seated rest break between walks. Dyspnea 2-3/4 with ambulation. Gait Pattern: Decreased stride length;Decreased step length - right;Decreased step length - left;Trunk flexed    Exercises     PT Diagnosis:    PT Problem List:   PT Treatment Interventions:     PT Goals Acute Rehab PT Goals Pt will go Supine/Side to Sit: Independently PT Goal: Supine/Side to Sit - Progress: Progressing toward goal Pt will go Sit to Supine/Side: Independently PT Goal: Sit to Supine/Side - Progress: Progressing toward goal Pt will go Sit to Stand: with modified independence PT Goal: Sit to Stand - Progress: Progressing toward goal Pt will go Stand to Sit: with modified independence PT Goal: Stand to Sit - Progress: Progressing toward goal Pt will Ambulate: 51 - 150 feet;with modified independence;with least restrictive assistive device PT Goal: Ambulate - Progress: Progressing toward goal  Visit Information  Last PT Received On: 02/16/12 Assistance Needed: +1    Subjective Data  Subjective: "I keep telling people, its not my legs. Its everything else." Patient Stated Goal: Home   Cognition  Overall Cognitive Status: Appears within functional limits for tasks assessed/performed Arousal/Alertness: Awake/alert Orientation Level: Appears intact for tasks assessed Behavior During Session: [facility] for tasks performed    Balance     End of Session PT - End of Session Activity Tolerance: Patient limited by fatigue Patient left: in bed;with call bell/phone within reach   GP     [name] [facility] 02/16/2012, 3:48 PM [phone]

## 2012-02-16 NOTE — Progress Notes (Signed)
Subjective: Pt ok. Not much appetite Reports feeling is 'not too bad' Currently 98% on room air Denies pain at drain site  Objective: Physical Exam: BP 166/65  Pulse 65  Temp 99.5 F (37.5 C) (Oral)  Resp 20  Ht 5\' 2"  (1.575 m)  Wt 158 lb 8.2 oz (71.9 kg)  BMI 28.99 kg/m2  SpO2 94% Abd drain intact, site clean, NT Output serous, not much recorded output past few days   Labs: CBC  Basename 02/15/12 0650 02/14/12 0536  WBC 17.0* 20.6*  HGB 9.4* 9.1*  HCT 28.5* 28.0*  PLT 420* 500*   BMET No results found for this basename: NA:2,K:2,CL:2,CO2:2,GLUCOSE:2,BUN:2,CREATININE:2,CALCIUM:2 in the last 72 hours LFT No results found for this basename: PROT,ALBUMIN,AST,ALT,ALKPHOS,BILITOT,BILIDIR,IBILI,LIPASE in the last 72 hours PT/INR No results found for this basename: LABPROT:2,INR:2 in the last 72 hours   Studies/Results: Dg Chest 2 View  02/16/2012  *RADIOLOGY REPORT*  Clinical Data: Pleural effusion  CHEST - 2 VIEW  Comparison: 02/14/2012  Findings: Cardiomegaly again noted.  There is moderate sized right pleural effusion increased in size from prior exam.  Right lower lobe atelectasis or infiltrate.  Bony thorax is stable.  No pulmonary edema.  IMPRESSION: Moderate sized right pleural effusion increased in size from prior exam.  Right lower lobe atelectasis or infiltrate.  Cardiomegaly again noted.   Original Report Authenticated By: Natasha Mead, M.D.    Dg Chest Port 1 View  02/14/2012  *RADIOLOGY REPORT*  Clinical Data: 66 year old female with cough, elevated white count. Recent thoracentesis.  PORTABLE CHEST - 1 VIEW  Comparison: 02/12/2012  Findings: Upper limits normal heart size again noted. Pulmonary vascular congestion is again noted. There appears to be slight increase in the small right pleural effusion. Bibasilar atelectasis is noted. There is no evidence of pneumothorax or definite pulmonary edema.  IMPRESSION: Apparent slight increase in small right pleural effusion.   Continued mild pulmonary vascular congestion and bibasilar atelectasis.   Original Report Authenticated By: Rosendo Gros, M.D.     Assessment/Plan: S/p perc abd drain, last CT 10/24 showed improved but unresolved fluid collection. Consider repeat CT next 1-2 days for reassessment and possible drain removal.    LOS: 10 days    Brayton El PA-C 02/16/2012 9:27 AM

## 2012-02-16 NOTE — Progress Notes (Signed)
Subjective: Up on bedside commode, says she can't eat, nausea, and gas.  Say her breathing is back to normal, and sats upper 90's-100 on room air.  Denies increased sob walking.  Needs help with using toilet but is not interested in Rehab on SNF.  Wants to go home end of week when daughter is available. She says even food family brought in made her sick. Objective: Vital signs in last 24 ho Temp:  [98.8 F (37.1 C)-99.5 F (37.5 C)] 99.5 F (37.5 C) (10/29 0600) Pulse Rate:  [65-70] 65  (10/29 0600) Resp:  [16-20] 20  (10/29 0600) BP: (163-172)/(52-67) 166/65 mmHg (10/29 0600) SpO2:  [92 %-97 %] 94 % (10/29 0600) Last BM Date: 02/15/12  PO 240 ml recorded yesterday, no drainage recorded. Diet regular, 3 BM's yesterday, TM 99.5, WBC still up yesterday.  C diff negative,10/27, pleural fluid was negative 10/25, no growth from abscess on admission. CXR shows moderate right side effusion increased from last film with RLL atelectasis,  Intake/Output from previous day: 10/28 0701 - 10/29 0700 In: 5  Out: -  Intake/Output this shift:    General appearance: alert, cooperative and no distress, quite anxious. Resp: clear to auscultation bilaterally upper lobes, rales in bases, BS decreased in base. GI: soft, tender, large open abdominal wound, still a bit soupy at the base. Very sensitive to pain. Extremities: some edema both lower legs.  Lab Results:   Basename 02/15/12 0650 02/14/12 0536  WBC 17.0* 20.6*  HGB 9.4* 9.1*  HCT 28.5* 28.0*  PLT 420* 500*    BMET No results found for this basename: NA:2,K:2,CL:2,CO2:2,GLUCOSE:2,BUN:2,CREATININE:2,CALCIUM:2 in the last 72 hours PT/INR No results found for this basename: LABPROT:2,INR:2 in the last 72 hours  No results found for this basename: AST:5,ALT:5,ALKPHOS:5,BILITOT:5,PROT:5,ALBUMIN:5 in the last 168 hours   Lipase     Component Value Date/Time   LIPASE 8* 01/23/2012 1656     Studies/Results: Dg Chest 2  View  02/16/2012  *RADIOLOGY REPORT*  Clinical Data: Pleural effusion  CHEST - 2 VIEW  Comparison: 02/14/2012  Findings: Cardiomegaly again noted.  There is moderate sized right pleural effusion increased in size from prior exam.  Right lower lobe atelectasis or infiltrate.  Bony thorax is stable.  No pulmonary edema.  IMPRESSION: Moderate sized right pleural effusion increased in size from prior exam.  Right lower lobe atelectasis or infiltrate.  Cardiomegaly again noted.   Original Report Authenticated By: Natasha Mead, M.D.    Dg Chest Port 1 View  02/14/2012  *RADIOLOGY REPORT*  Clinical Data: 66 year old female with cough, elevated white count. Recent thoracentesis.  PORTABLE CHEST - 1 VIEW  Comparison: 02/12/2012  Findings: Upper limits normal heart size again noted. Pulmonary vascular congestion is again noted. There appears to be slight increase in the small right pleural effusion. Bibasilar atelectasis is noted. There is no evidence of pneumothorax or definite pulmonary edema.  IMPRESSION: Apparent slight increase in small right pleural effusion.  Continued mild pulmonary vascular congestion and bibasilar atelectasis.   Original Report Authenticated By: Rosendo Gros, M.D.     Medications:    . acetaminophen  650 mg Rectal Once  . antiseptic oral rinse  15 mL Mouth Rinse BID  . feeding supplement  237 mL Oral BID BM  . heparin  5,000 Units Subcutaneous Q8H  . metronidazole  500 mg Intravenous Q6H  . pantoprazole (PROTONIX) IV  40 mg Intravenous QHS  . piperacillin-tazobactam (ZOSYN)  IV  3.375 g Intravenous Q8H  Assessment/Plan Crohn's disease with perforation, peritonitis, s/p Resection of ileocolonic anastomosis with perforation.   Ileocolonic anastomosis between mid ileum and proximal transverse colon. Velora Heckler, MD, 01/24/2012.   Abscess beneath the abdominal wall with CT guided drainage of abscess 02/07/12. cefepime and levofloxacin started 10/19 for 3 days, Vancomycin 2  days on 10/19-10/20,  switched to zosyn 10/21. Flagyl started over weekend for possible C diff which is negative.    Post op fluid overload/hypoxemia on admit due to atelectasis/pleural effusions S/p right thoracentesis 1/1 liter 02/12/12 - some increase in right pleural fluid on CXR.   Chronic lymphedema of the RLE, unknown etiology Hypertension  Hyperlipemia   Plan:  Stop flagyl, recheck labs in AM, get repeat CT in AM, restart her lasix, check ua today.       LOS: 10 days   Luoma,WILLARD 02/16/2012  Taking some po.  Maybe somewhat better than before.  Wound okay, but still needs to clean up some. Agree with repeat CT scan tomorrow to evaluate abscess drainage.  Ovidio Kin, MD, [facility] Surgery Pager: [phone] Office phone:  [phone]

## 2012-02-17 ENCOUNTER — Inpatient Hospital Stay (HOSPITAL_COMMUNITY): Payer: Medicare Other

## 2012-02-17 LAB — COMPREHENSIVE METABOLIC PANEL
ALT: 6 U/L (ref 0–35)
Albumin: 1.5 g/dL — ABNORMAL LOW (ref 3.5–5.2)
Calcium: 7.7 mg/dL — ABNORMAL LOW (ref 8.4–10.5)
GFR calc Af Amer: >90 mL/min (ref >90)
Glucose, Bld: 155 mg/dL — ABNORMAL HIGH (ref 70–99)
Potassium: 2.9 mEq/L — ABNORMAL LOW (ref 3.5–5.1)
Sodium: 133 mEq/L — ABNORMAL LOW (ref 135–145)
Total Protein: 5.4 g/dL — ABNORMAL LOW (ref 6.0–8.3)

## 2012-02-17 LAB — CBC
Hemoglobin: 9.7 g/dL — ABNORMAL LOW (ref 12.0–15.0)
MCH: 26.9 pg (ref 26.0–34.0)
MCHC: 33.1 g/dL (ref 30.0–36.0)
RDW: 16.3 % — ABNORMAL HIGH (ref 11.5–15.5)

## 2012-02-17 LAB — URINALYSIS, ROUTINE W REFLEX MICROSCOPIC
Glucose, UA: NEGATIVE mg/dL
Hgb urine dipstick: NEGATIVE
Specific Gravity, Urine: 1.04 — ABNORMAL HIGH (ref 1.005–1.030)

## 2012-02-17 LAB — URINE MICROSCOPIC-ADD ON

## 2012-02-17 MED ORDER — ENSURE COMPLETE PO LIQD: 237.0000 mL | Freq: Two times a day (BID) | ORAL | Status: DC

## 2012-02-17 MED ORDER — POTASSIUM CHLORIDE CRYS ER 20 MEQ PO TBCR
30.0000 meq | EXTENDED_RELEASE_TABLET | Freq: Three times a day (TID) | ORAL | Status: DC
  Administered 2012-02-17 – 2012-03-07 (×54): 30 meq via ORAL
  Filled 2012-02-17 (×62): qty 1

## 2012-02-17 MED ORDER — IOHEXOL 300 MG/ML  SOLN
100.0000 mL | Freq: Once | INTRAMUSCULAR | Status: AC | PRN
  Administered 2012-02-17: 100 mL via INTRAVENOUS

## 2012-02-17 MED ORDER — PANTOPRAZOLE SODIUM 40 MG IV SOLR
40.0000 mg | Freq: Two times a day (BID) | INTRAVENOUS | Status: DC
  Administered 2012-02-17 – 2012-02-19 (×4): 40 mg via INTRAVENOUS
  Filled 2012-02-17 (×5): qty 40

## 2012-02-17 NOTE — Progress Notes (Signed)
Patient ambulating in the hall with nursing tech today x2.  Tolerated well.  Patient pulse oximetry upon ambulation 96% with heart rate of 96%.  Will continue to monitor.

## 2012-02-17 NOTE — Progress Notes (Signed)
ANTIBIOTIC CONSULT NOTE - Follow Up  Pharmacy Consult for Zosyn Indication: Abdominal abscess  Allergies  Allergen Reactions  . Tomato Other (See Comments)    Reaction: caused bumps in her mouth    Patient Measurements: Height: 5\' 2"  (157.5 cm) Weight: 158 lb 8.2 oz (71.9 kg) IBW/kg (Calculated) : 50.1   Vital Signs: Temp: 98.4 F (36.9 C) (10/30 0600) Temp src: Oral (10/30 0600) BP: 158/61 mmHg (10/30 0600) Pulse Rate: 63  (10/30 0600) Intake/Output from previous day: 10/29 0701 - 10/30 0700 In: 340 [P.O.:240; IV Piggyback:100] Out: 1035 [Urine:975; Drains:60] Intake/Output from this shift: Total I/O In: -  Out: 300 [Urine:300]  Labs:  [facility] 02/17/12 0520 02/15/12 0650  WBC 19.2* 17.0*  HGB 9.7* 9.4*  PLT 455* 420*  LABCREA -- --  CREATININE 0.70 --   Estimated Creatinine Clearance: 63.3 ml/min (by C-G formula based on Cr of 0.7).  Medical History: Past Medical History  Diagnosis Date  . Bowel perforation   . Crohn disease   . Hypertension   . Hyperlipemia   . Swelling of extremity, left   . Swelling of extremity, right    Microbiology: 10/19 urine >> 20K colonies/ml multiple morphotypes 10/20 abscess drain >> NGF 10/20: MRSA pcr >> negative 10/25: Fluid from pleural effusion tap >> NGF 10/27: Cdiff: negative   Assessment:  63 YOF admitted from SNF with fever.  Pt recently discharged s/p ileocecectomy for Crohn's disease with perforation.  Found to have abdominal abscess beneath incision, perc drain placed 10/20.  Patient empirically started on vancomycin/cefepime/levaquin 10/19 for questionable HCAP as fever source, switched therapy to Zosyn for empiric treatment for abdominal abscess at that point.    Day #10 Zosyn for abdominal abscess.  R thoracentesis 10/25 removed 1.1L and fluid sent for cx which were negative.   WBC trending up, CT today to evaluate increased effusion  Renal function stable, CrCl>20 ml/min  Goal of Therapy:  doses  adjusted per renal clearance  Plan:   Continue Zosyn 3.375g IV q8h (4 hour infusion time) Follow up renal function & cultures Follow up duration of therapy  Loralee Pacas, PharmD, BCPS Pager: [phone] 02/17/2012 9:53 AM

## 2012-02-17 NOTE — Progress Notes (Signed)
Subjective: Pt states she's doing ok; has ambulated without significant dyspnea; reports no abd pain at present; has had intermittent nausea  Objective: Vital signs in last 24 hours: Temp:  [98.4 F (36.9 C)-99.6 F (37.6 C)] 98.7 F (37.1 C) (10/30 1400) Pulse Rate:  [63-71] 69  (10/30 1400) Resp:  [18-20] 20  (10/30 1400) BP: (158-182)/(61-74) 182/67 mmHg (10/30 1400) SpO2:  [93 %-100 %] 100 % (10/30 1400) Last BM Date: 02/15/12  Intake/Output from previous day: 10/29 0701 - 10/30 0700 In: 340 [P.O.:240; IV Piggyback:100] Out: 1035 [Urine:975; Drains:60] Intake/Output this shift: Total I/O In: 460 [P.O.:460] Out: 550 [Urine:550]  Rt abd drain intact, insertion site ok, mildly tender, output 60 cc's since yesterday; drain flushed with 5 cc's sterile NS with return of same amt(turbid, beige fluid) Lab Results:   [facility] 02/17/12 0520 02/15/12 0650  WBC 19.2* 17.0*  HGB 9.7* 9.4*  HCT 29.3* 28.5*  PLT 455* 420*   BMET  Basename 02/17/12 0520  NA 133*  K 2.9*  CL 102  CO2 21  GLUCOSE 155*  BUN 8  CREATININE 0.70  CALCIUM 7.7*   PT/INR No results found for this basename: LABPROT:2,INR:2 in the last 72 hours ABG No results found for this basename: PHART:2,PCO2:2,PO2:2,HCO3:2 in the last 72 hours  Studies/Results: Dg Chest 2 View  02/16/2012  *RADIOLOGY REPORT*  Clinical Data: Pleural effusion  CHEST - 2 VIEW  Comparison: 02/14/2012  Findings: Cardiomegaly again noted.  There is moderate sized right pleural effusion increased in size from prior exam.  Right lower lobe atelectasis or infiltrate.  Bony thorax is stable.  No pulmonary edema.  IMPRESSION: Moderate sized right pleural effusion increased in size from prior exam.  Right lower lobe atelectasis or infiltrate.  Cardiomegaly again noted.   Original Report Authenticated By: Natasha Mead, M.D.    Ct Abdomen Pelvis W Contrast  02/17/2012  *RADIOLOGY REPORT*  Clinical Data: Follow-up intra-abdominal abscess.   CT ABDOMEN AND PELVIS WITH CONTRAST  Technique:  Multidetector CT imaging of the abdomen and pelvis was performed following the standard protocol during bolus administration of intravenous contrast.  Contrast: OMNIPAQUE IOHEXOL 300 MG/ML  SOLN  Comparison: Multiple prior CT scans.  Findings: The lung bases demonstrate persistent moderate sized pleural effusions, right greater than left with compressive atelectasis.  No pericardial effusion.  There are enlarging perihepatic fluid collections which are worrisome for infection/abscess.  These are subdiaphragmatic and all along the periphery of the liver.  There are clearly enlarged since prior scans and are irregular in appearance.  There are areas of similar findings involving the medial and lower aspect of the spleen which could be a small abscesses.  Persistent fluid around the gallbladder and marked thickening of the proximal duodenum.  A small scattered fluid collections are noted in the abdomen without discrete rim enhancement.  There is a new fluid collection in the retroperitoneum on the right side. Close observation is recommended.  The drainage catheter is in place.  No residual anterior abdominal wall abscess.  The small bowel and colon are grossly normal and stable.  The ileal colonic anastomoses appears stable.  There is persistent diffuse interstitial inflammatory changes in the right abdomen with mesenteric thickening and nodularity.  The uterus and ovaries are unremarkable except for left ovarian cyst.  No pelvic mass or lymphadenopathy.  There is a small amount of free pelvic fluid.  No inguinal mass or hernia.  Open anterior abdominal wound is again noted.  IMPRESSION:  1.  Enlarging irregular perihepatic fluid collections most likely representing rind of abscess surrounding the liver capsule. 2. Perisplenic fluid collections are also present and could also reflect abscess. 3.  Persistent fluid around the gallbladder and marked wall thickening  of the proximal duodenum. 4.  Persistent peritoneal inflammatory changes with marked interstitial thickening inflammation and edema along with nodularity and small lymph nodes. 5.  New right-sided retroperitoneal fluid collection without obvious rim enhancement.  Close observation recommended.  6.  Persistent moderate-sized bilateral pleural effusions with compressive atelectasis. 7.  Complete and adequate drainage of the anterior abdominal wall abscess.   Original Report Authenticated By: P. Loralie Champagne, M.D.     Anti-infectives: Anti-infectives     Start     Dose/Rate Route Frequency Ordered Stop   02/14/12 1000   metroNIDAZOLE (FLAGYL) IVPB 500 mg  Status:  Discontinued        500 mg 100 mL/hr over 60 Minutes Intravenous Every 6 hours 02/14/12 0924 02/16/12 1043   02/08/12 1400   ceFEPIme (MAXIPIME) 1 g in dextrose 5 % 50 mL IVPB  Status:  Discontinued        1 g 100 mL/hr over 30 Minutes Intravenous 3 times per day 02/08/12 1137 02/08/12 1141   02/08/12 1400  piperacillin-tazobactam (ZOSYN) IVPB 3.375 g       3.375 g 12.5 mL/hr over 240 Minutes Intravenous Every 8 hours 02/08/12 1145     02/08/12 1300   levofloxacin (LEVAQUIN) IVPB 750 mg  Status:  Discontinued        750 mg 100 mL/hr over 90 Minutes Intravenous Every 24 hours 02/08/12 1137 02/08/12 1141   02/06/12 1800   vancomycin (VANCOCIN) IVPB 1000 mg/200 mL premix  Status:  Discontinued        1,000 mg 200 mL/hr over 60 Minutes Intravenous Every 12 hours 02/06/12 1448 02/08/12 1133   02/06/12 1445   ceFEPIme (MAXIPIME) 1 g in dextrose 5 % 50 mL IVPB  Status:  Discontinued        1 g 100 mL/hr over 30 Minutes Intravenous 3 times per day 02/06/12 1431 02/08/12 1137   02/06/12 1445   levofloxacin (LEVAQUIN) IVPB 750 mg  Status:  Discontinued        750 mg 100 mL/hr over 90 Minutes Intravenous Every 24 hours 02/06/12 1431 02/08/12 1137          Assessment/Plan: s/p rt abd wall fluid coll drainage 10/20; CT today shows  fluid collections around liver, GB and rt RP space, none of which are drainable per IR review. Would leave ant abd drain in for now with current output- consider drain injection. Check urine cx. ?ID consult  LOS: 11 days    Loreley Schwall,D [facility] 02/17/2012

## 2012-02-17 NOTE — Progress Notes (Signed)
Subjective: Still complains of some nausea, better some yesterday, but still having trouble with this.    Objective: Vital signs in last 24 hours: Temp:  [98 F (36.7 C)-99.6 F (37.6 C)] 98.4 F (36.9 C) (10/30 0600) Pulse Rate:  [60-71] 63  (10/30 0600) Resp:  [18] 18  (10/30 0600) BP: (158-182)/(61-74) 158/61 mmHg (10/30 0600) SpO2:  [93 %-98 %] 98 % (10/30 0600) Last BM Date: 02/15/12  Diet:  NPO, WBC rising, K+ 2.9, will check magnesium, afebrile, worsening effusion again on left, 60 ml thru drain, currently it is flushed and clear. Temp oral 98.6  Intake/Output from previous day: 10/29 0701 - 10/30 0700 In: 340 [P.O.:240; IV Piggyback:100] Out: 1035 [Urine:975; Drains:60] Intake/Output this shift:    General appearance: alert, cooperative and no distress Resp: clear to auscultation bilaterally and minimally decreased on Right. GI: soft, still tender at drain site and open abd wound.  Open wound is clean and pink, some white fibrous exudate in some places, but over all looks good.  Lab Results:   Basename 02/17/12 0520 02/15/12 0650  WBC 19.2* 17.0*  HGB 9.7* 9.4*  HCT 29.3* 28.5*  PLT 455* 420*    BMET  Basename 02/17/12 0520  NA 133*  K 2.9*  CL 102  CO2 21  GLUCOSE 155*  BUN 8  CREATININE 0.70  CALCIUM 7.7*   PT/INR No results found for this basename: LABPROT:2,INR:2 in the last 72 hours   Lab 02/17/12 0520  AST 10  ALT 6  ALKPHOS 118*  BILITOT 0.5  PROT 5.4*  ALBUMIN 1.5*     Lipase     Component Value Date/Time   LIPASE 8* 01/23/2012 1656     Studies/Results: Dg Chest 2 View  02/16/2012  *RADIOLOGY REPORT*  Clinical Data: Pleural effusion  CHEST - 2 VIEW  Comparison: 02/14/2012  Findings: Cardiomegaly again noted.  There is moderate sized right pleural effusion increased in size from prior exam.  Right lower lobe atelectasis or infiltrate.  Bony thorax is stable.  No pulmonary edema.  IMPRESSION: Moderate sized right pleural effusion  increased in size from prior exam.  Right lower lobe atelectasis or infiltrate.  Cardiomegaly again noted.   Original Report Authenticated By: Natasha Mead, M.D.     Medications:    . acetaminophen  650 mg Rectal Once  . antiseptic oral rinse  15 mL Mouth Rinse BID  . famotidine  20 mg Oral BID  . feeding supplement  237 mL Oral BID BM  . furosemide  20 mg Oral Daily  . heparin  5,000 Units Subcutaneous Q8H  . piperacillin-tazobactam (ZOSYN)  IV  3.375 g Intravenous Q8H  . DISCONTD: metronidazole  500 mg Intravenous Q6H  . DISCONTD: pantoprazole (PROTONIX) IV  40 mg Intravenous QHS    Assessment/Plan Crohn's disease with perforation, peritonitis, s/p Resection of ileocolonic anastomosis with perforation.  Ileocolonic anastomosis between mid ileum and proximal transverse colon. Velora Heckler, MD, 01/24/2012.  Path: Crohn's disease with ulceration and transmucosal inflammation  Abscess beneath the abdominal wall with CT guided drainage of abscess 02/07/12.  cefepime and levofloxacin started 10/19 for 3 days, Vancomycin 2 days on 10/19-10/20, switched to zosyn 10/21.   Flagyl started over weekend for possible C diff which is negative.   Post op fluid overload/hypoxemia on admit due to atelectasis/pleural effusions  PLEURAL EFFUSION ON right.  S/p right thoracentesis 1.1 liter 02/12/12 - some increase in right pleural fluid on CXR. Prior cytology shows, chronic inflammation, and  atypical mesothelial cells. Chronic lymphedema of the RLE, unknown etiology  Hypertension  Hyperlipemia   Plan:  Repeat Ct today, drinking contrast.  Would like to know when she can go home, so family can plan.  Daughter is moving in with her for now to help with care..  If effusion is worse consider medicine consult, replace K+.  CT scan shows:  Enlarging irregular perihepatic fluid collections most likely representing rind of abscess surrounding the liver capsule. 2. Perisplenic fluid collections are also  present and could also reflect abscess. 3. Persistent fluid around the gallbladder and marked wall thickening of the proximal duodenum. 4. Persistent peritoneal inflammatory changes with marked interstitial thickening inflammation and edema along with nodularity and small lymph nodes. 5. New right-sided retroperitoneal fluid collection without obvious rim enhancement. Close observation recommended.  6. Persistent moderate-sized bilateral pleural effusions with compressive atelectasis. 7. Complete and adequate drainage of the anterior abdominal wall abscess.  Will discuss with Dr. Biagio Quint and radiology.   LOS: 11 days    Harbach,WILLARD 02/17/2012  She feels better today.  Taking liquids okay but frequent nausea with solids.  CT noted.  Not sure why she has this intraabdominal fluid.  Nothing drainable.  Duodenal inflammation likely causing the nausea.  Will start PPI and ask GI to evaluate.  Could this be duodenal crohn's?  Breathing better today.  Ensure and calorie counts to see if she needs other nutrition.

## 2012-02-17 NOTE — Progress Notes (Signed)
Patient only had an [place] ice and jello for dinner this am.

## 2012-02-18 ENCOUNTER — Inpatient Hospital Stay (HOSPITAL_COMMUNITY): Payer: Medicare Other

## 2012-02-18 LAB — BASIC METABOLIC PANEL
BUN: 7 mg/dL (ref 6–23)
Calcium: 7.5 mg/dL — ABNORMAL LOW (ref 8.4–10.5)
Creatinine, Ser: 0.78 mg/dL (ref 0.50–1.10)
GFR calc Af Amer: >90 mL/min (ref >90)
GFR calc non Af Amer: 85 mL/min — ABNORMAL LOW (ref >90)

## 2012-02-18 MED ORDER — ENSURE PUDDING PO PUDG
1.0000 | Freq: Three times a day (TID) | ORAL | Status: DC
  Administered 2012-02-19 – 2012-02-21 (×2): 1 via ORAL
  Filled 2012-02-18 (×11): qty 1

## 2012-02-18 MED ORDER — SODIUM CHLORIDE 0.9 % IV SOLN
INTRAVENOUS | Status: DC
  Administered 2012-02-19: 500 mL via INTRAVENOUS

## 2012-02-18 NOTE — Progress Notes (Signed)
Calorie Count Note  Intervention: D/C Ensure Complete per pt request. Ensure pudding TID as pt likes pudding. Assisted pt with ordering dinner. RD to monitor and analyze calorie count. Hopefully pt's nausea will not return and intake will improve. Will monitor.   48 hour calorie count ordered.  Diet: Regular  Supplements: Ensure Complete BID   Breakfast: 40 calories, 1g protein  - Pt reports she did not eat lunch because of ongoing nausea, however currently denies any nausea. Pt states when she got the Ensure on 10/28 it made her nauseated so she refuses to drink anymore.   Levon Hedger MS, RD, LDN [phone] Pager [phone] After Hours Pager

## 2012-02-18 NOTE — Progress Notes (Signed)
CARE MANAGEMENT NOTE 02/18/2012  Patient:  Michele David, Michele David   Account Number:  [id number]  Date Initiated:  02/11/2012  Documentation initiated by:  [facility]  Subjective/Objective Assessment:   66 year old female admitted with abdominal abscess.     Action/Plan:   Pt want to go home with [facility] services and family to offer assist 24/7.   Anticipated DC Date:  02/18/2012   Anticipated DC Plan:  HOME W HOME HEALTH SERVICES         Choice offered to / List presented to:  C-1 Patient   DME arranged  NEGATIVE PRESSURE WOUND DEVICE      DME agency  [facility]     HH arranged  HH-1 RN  HH-2 PT      [facility] agency  [facility]   Status of service:   Medicare Important Message given?   (If response is "NO", the following Medicare IM given date fields will be blank) Date Medicare IM given:   Date Additional Medicare IM given:    Discharge Disposition:    Per UR Regulation:    If discussed at Long Length of Stay Meetings, dates discussed:   02/18/2012    Comments:  [id number] Marcelle Smiling, RN, BSN, CCM No discharge needs present at time of this review Case Management [phone]  Algernon Huxley RN BSN 02/15/12 Thoracentesis done on 10/25, cultures pending, having breathin issues, WBC's elevated and spiked temp. On IV abx.  Algernon Huxley RN BSN 02/11/12 Awaiting home health orders. Spoke with Doristine Mango PA [phone]) and she is fine with using the [facility] wound vac version.

## 2012-02-18 NOTE — Progress Notes (Signed)
Subjective: She feels a little better, complains of gas like discomfort with PO's but not as bad as before. No complaints with breathing, but it sounds like she's not going very far.  Objective: Vital signs in last 24 hours: Temp:  [98.7 F (37.1 C)-99.4 F (37.4 C)] 99.4 F (37.4 C) (10/31 0604) Pulse Rate:  [66-74] 74  (10/31 0604) Resp:  [18-20] 18  (10/31 0604) BP: (149-182)/(67-74) 170/67 mmHg (10/31 0604) SpO2:  [95 %-100 %] 98 % (10/31 0604) Last BM Date: 02/15/12  1180 ml PO recorded yesterday, nothing from the drain recorded yesterday. TM 99.4, BP up some, BMP is OK, K+ has been replaced  Intake/Output from previous day: 10/30 0701 - 10/31 0700 In: 3755.3 [P.O.:1180; I.V.:2425.3; IV Piggyback:150] Out: 850 [Urine:850] Intake/Output this shift: Total I/O In: -  Out: 200 [Urine:200]  General appearance: alert, cooperative and no distress Resp: clear to auscultation bilaterally and some decrease right base. GI: soft, some bowel sounds she is not distended, Open wound still has some exudate at base, but looks better than Monday.,  Lab Results:   [facility] 02/17/12 0520  WBC 19.2*  HGB 9.7*  HCT 29.3*  PLT 455*    BMET  Basename 02/18/12 0522 02/17/12 0520  NA 135 133*  K 4.2 2.9*  CL 105 102  CO2 22 21  GLUCOSE 140* 155*  BUN 7 8  CREATININE 0.78 0.70  CALCIUM 7.5* 7.7*   PT/INR No results found for this basename: LABPROT:2,INR:2 in the last 72 hours   Lab 02/17/12 0520  AST 10  ALT 6  ALKPHOS 118*  BILITOT 0.5  PROT 5.4*  ALBUMIN 1.5*     Lipase     Component Value Date/Time   LIPASE 8* 01/23/2012 1656     Studies/Results: Ct Abdomen Pelvis W Contrast  02/17/2012  *RADIOLOGY REPORT*  Clinical Data: Follow-up intra-abdominal abscess.  CT ABDOMEN AND PELVIS WITH CONTRAST  Technique:  Multidetector CT imaging of the abdomen and pelvis was performed following the standard protocol during bolus administration of intravenous contrast.   Contrast: OMNIPAQUE IOHEXOL 300 MG/ML  SOLN  Comparison: Multiple prior CT scans.  Findings: The lung bases demonstrate persistent moderate sized pleural effusions, right greater than left with compressive atelectasis.  No pericardial effusion.  There are enlarging perihepatic fluid collections which are worrisome for infection/abscess.  These are subdiaphragmatic and all along the periphery of the liver.  There are clearly enlarged since prior scans and are irregular in appearance.  There are areas of similar findings involving the medial and lower aspect of the spleen which could be a small abscesses.  Persistent fluid around the gallbladder and marked thickening of the proximal duodenum.  A small scattered fluid collections are noted in the abdomen without discrete rim enhancement.  There is a new fluid collection in the retroperitoneum on the right side. Close observation is recommended.  The drainage catheter is in place.  No residual anterior abdominal wall abscess.  The small bowel and colon are grossly normal and stable.  The ileal colonic anastomoses appears stable.  There is persistent diffuse interstitial inflammatory changes in the right abdomen with mesenteric thickening and nodularity.  The uterus and ovaries are unremarkable except for left ovarian cyst.  No pelvic mass or lymphadenopathy.  There is a small amount of free pelvic fluid.  No inguinal mass or hernia.  Open anterior abdominal wound is again noted.  IMPRESSION:  1.  Enlarging irregular perihepatic fluid collections most likely representing rind  of abscess surrounding the liver capsule. 2. Perisplenic fluid collections are also present and could also reflect abscess. 3.  Persistent fluid around the gallbladder and marked wall thickening of the proximal duodenum. 4.  Persistent peritoneal inflammatory changes with marked interstitial thickening inflammation and edema along with nodularity and small lymph nodes. 5.  New right-sided  retroperitoneal fluid collection without obvious rim enhancement.  Close observation recommended.  6.  Persistent moderate-sized bilateral pleural effusions with compressive atelectasis. 7.  Complete and adequate drainage of the anterior abdominal wall abscess.   Original Report Authenticated By: P. Loralie Champagne, M.D.     Medications:    . acetaminophen  650 mg Rectal Once  . antiseptic oral rinse  15 mL Mouth Rinse BID  . famotidine  20 mg Oral BID  . feeding supplement  237 mL Oral BID BM  . furosemide  20 mg Oral Daily  . heparin  5,000 Units Subcutaneous Q8H  . pantoprazole (PROTONIX) IV  40 mg Intravenous Q12H  . potassium chloride  30 mEq Oral TID PC  . DISCONTD: feeding supplement  237 mL Oral BID BM  . DISCONTD: piperacillin-tazobactam (ZOSYN)  IV  3.375 g Intravenous Q8H    Assessment/Plan Crohn's disease with perforation, peritonitis, s/p Resection of ileocolonic anastomosis with perforation.  Ileocolonic anastomosis between mid ileum and proximal transverse colon. Michele Heckler, MD, 01/24/2012.  Path: Crohn's disease with ulceration and transmucosal inflammation  Abscess beneath the abdominal wall with CT guided drainage of abscess 02/07/12.  cefepime and levofloxacin started 10/19 for 3 days, Vancomycin 2 days on 10/19-10/20, switched to zosyn 10/21.  Flagyl started over weekend for possible C diff which is negative.  CT yesterday shows thickening of the proximal duodenum,peritoneal inflammatory changes with edema and some lymphadenopathy,  enlarging collections around the  Liver and spleen, and new right sided retroperitoneal fluid collections without rim enhancement, . Post op fluid overload/hypoxemia on admit due to atelectasis/pleural effusions  PLEURAL EFFUSION ON right. S/p right thoracentesis 1.1 liter 02/12/12 - some increase in right pleural fluid on CXR. Prior cytology shows, chronic inflammation, and atypical mesothelial cells.  Chronic lymphedema of the RLE,  unknown etiology  Hypertension  Hyperlipemia    Plan:  Ask GI to see and evaluate for her Crohn's, we are stopping her antibiotics, she has been on them since 10/19.  Ask dietary to monitor for calorie count, she doesn't eat much, and if they bring her the wrong thing she won't eat at all.  She seems stable from her effusion clinically and we will recheck labs and cxr tomorrow.     LOS: 12 days    Servello,Michele David 02/18/2012  She tolerated regular diet yesterday. Just nausea but no vomiting.  She has small fluid collections in her abdomen but nothing drainable.  GI consult for crohns.  Calorie counts to see if she will need additional nutrition support.

## 2012-02-18 NOTE — Progress Notes (Signed)
Physical Therapy Treatment Patient Details Name: Michele David MRN: [id number] DOB: March 05, 1946 Today's Date: 02/18/2012 Time: 4098-1191 PT Time Calculation (min): 23 min  PT Assessment / Plan / Recommendation Comments on Treatment Session  Continues to demonstrate general weakness and decreased activity tolerance-SOB with activity. Progressing slowly. Fatigued easily after ascending 5 steps today. Pt will need to consider living on 1st level of home or having ambulance transport home to assist pt upstairs.     Follow Up Recommendations  Post acute inpatient (pt refusing);Home health PT;Supervision/Assistance - 24 hour     Does the patient have the potential to tolerate intense rehabilitation  No, Recommend SNF-pt refusing  Barriers to Discharge        Equipment Recommendations  Rolling walker with 5" wheels;3 in 1 bedside comode    Recommendations for Other Services OT consult  Frequency Min 3X/week   Plan Discharge plan remains appropriate    Precautions / Restrictions Precautions Precautions: Fall Restrictions Weight Bearing Restrictions: No   Pertinent Vitals/Pain Abdomen-unrated. Pre-medicated prior to PT session.     Mobility  Bed Mobility Bed Mobility: Supine to Sit;Sit to Supine Supine to Sit: HOB elevated;4: Min assist Sit to Supine: HOB elevated;4: Min assist Details for Bed Mobility Assistance: Assist for trunk to upright. Increased time.  Transfers Transfers: Sit to Stand;Stand to Sit Sit to Stand: 4: Min guard;From bed;With upper extremity assist Stand to Sit: 4: Min guard;To bed;With upper extremity assist Ambulation/Gait Ambulation/Gait Assistance: 4: Min assist Ambulation Distance (Feet): 80 Feet Assistive device: Rolling walker Ambulation/Gait Assistance Details: Dyspnea 3/4 with ambulation. O2 sats 95% on RA after gait. Continues to fatigue easily.  Gait Pattern: Decreased stride length;Trunk flexed;Step-through pattern Stairs: Yes Stairs  Assistance: 4: Min assist Stairs Assistance Details (indicate cue type and reason): VCs safety, technique, sequence. Assist to stabilize. Pt noted to be fatigued after only 5 steps. Do not feel pt will be able to ascend entire flight once home.  Stair Management Technique: Two rails Number of Stairs: 5  (up and over portable steps  x 2)    Exercises     PT Diagnosis:    PT Problem List:   PT Treatment Interventions:     PT Goals Acute Rehab PT Goals Pt will go Supine/Side to Sit: Independently PT Goal: Supine/Side to Sit - Progress: Progressing toward goal Pt will go Sit to Supine/Side: Independently PT Goal: Sit to Supine/Side - Progress: Progressing toward goal Pt will go Sit to Stand: with modified independence PT Goal: Sit to Stand - Progress: Progressing toward goal Pt will go Stand to Sit: with modified independence PT Goal: Stand to Sit - Progress: Progressing toward goal Pt will Ambulate: 51 - 150 feet;with modified independence;with least restrictive assistive device Pt will Go Up / Down Stairs: Flight;with min assist;with least restrictive assistive device PT Goal: Up/Down Stairs - Progress: Progressing toward goal  Visit Information  Last PT Received On: 02/18/12 Assistance Needed: +1    Subjective Data  Subjective: "I'm sorry I didn't do better for you girls" Patient Stated Goal: Home   Cognition  Overall Cognitive Status: Appears within functional limits for tasks assessed/performed Arousal/Alertness: Awake/alert Orientation Level: Appears intact for tasks assessed Behavior During Session: [facility] for tasks performed    Balance     End of Session PT - End of Session Equipment Utilized During Treatment: Oxygen Activity Tolerance: Patient limited by fatigue Patient left: in bed;with call bell/phone within reach   GP     Gershon Mussel  02/18/2012, 4:22 PM [id number]

## 2012-02-18 NOTE — Consult Note (Signed)
Reason for Consult: Nausea and Abnormal CT scan Referring Physician: CCS  Fritz Pickerel HPI: This is a 66 year old female who was admitted for a perforation of the ileocolonic anastamosis consistent with active Crohn's disease and subsequent abscess formation beneath the abdominal wall.  She underwent CT guided drainage on 02/07/2012 and is currently on antibiotics.  During this admission she has had a very difficult time with PO intake in that nausea is a significant issue for her.  A repeat CT scan revealed a probable abscess formation around the liver capsule and thickening of her duodenum as well as wall thickening in the gallbladder  As a result of the findings and her symptoms a GI consultation was requested.  Past Medical History  Diagnosis Date  . Bowel perforation   . Crohn disease   . Hypertension   . Hyperlipemia   . Swelling of extremity, left   . Swelling of extremity, right     Past Surgical History  Procedure Date  . Laparotomy 01/23/2012    Procedure: EXPLORATORY LAPAROTOMY;  Surgeon: Velora Heckler, MD;  Location: [facility];  Service: General;  Laterality: N/A;  lysis of adhesions resection ileocolonic anastamosos with perforation    History reviewed. No pertinent family history.  Social History:  reports that she has quit smoking. She has never used smokeless tobacco. She reports that she does not drink alcohol or use illicit drugs.  Allergies:  Allergies  Allergen Reactions  . Tomato Other (See Comments)    Reaction: caused bumps in her mouth    Medications:  Scheduled:   . acetaminophen  650 mg Rectal Once  . antiseptic oral rinse  15 mL Mouth Rinse BID  . famotidine  20 mg Oral BID  . feeding supplement  237 mL Oral BID BM  . furosemide  20 mg Oral Daily  . heparin  5,000 Units Subcutaneous Q8H  . pantoprazole (PROTONIX) IV  40 mg Intravenous Q12H  . potassium chloride  30 mEq Oral TID PC  . DISCONTD: feeding supplement  237 mL Oral BID BM  .  DISCONTD: piperacillin-tazobactam (ZOSYN)  IV  3.375 g Intravenous Q8H   Continuous:   . dextrose 5 % and 0.9 % NaCl with KCl 20 mEq/L 75 mL/hr (02/17/12 1751)    Results for orders placed during the hospital encounter of 02/06/12 (from the past 24 hour(s))  BASIC METABOLIC PANEL     Status: Abnormal   Collection Time   02/18/12  5:22 AM      Component Value Range   Sodium 135  135 - 145 mEq/L   Potassium 4.2  3.5 - 5.1 mEq/L   Chloride 105  96 - 112 mEq/L   CO2 22  19 - 32 mEq/L   Glucose, Bld 140 (*) 70 - 99 mg/dL   BUN 7  6 - 23 mg/dL   Creatinine, Ser 9.14  0.50 - 1.10 mg/dL   Calcium 7.5 (*) 8.4 - 10.5 mg/dL   GFR calc non Af Amer 85 (*) >90 mL/min   GFR calc Af Amer >90  >90 mL/min     Dg Chest 2 View  02/18/2012  *RADIOLOGY REPORT*  Clinical Data: Cough, nausea.  CHEST - 2 VIEW  Comparison: 02/16/2012  Findings: Cardiomegaly.  Moderate right pleural effusion with right lower lobe airspace disease, not significantly changed.  Left basilar atelectasis or infiltrate also present.  Possible small left effusion.  No acute bony abnormality.  IMPRESSION: Stable exam.  Bilateral lower  lobe airspace opacities and effusions, right greater than left.   Original Report Authenticated By: Charlett Nose, M.D.    Ct Abdomen Pelvis W Contrast  02/17/2012  *RADIOLOGY REPORT*  Clinical Data: Follow-up intra-abdominal abscess.  CT ABDOMEN AND PELVIS WITH CONTRAST  Technique:  Multidetector CT imaging of the abdomen and pelvis was performed following the standard protocol during bolus administration of intravenous contrast.  Contrast: OMNIPAQUE IOHEXOL 300 MG/ML  SOLN  Comparison: Multiple prior CT scans.  Findings: The lung bases demonstrate persistent moderate sized pleural effusions, right greater than left with compressive atelectasis.  No pericardial effusion.  There are enlarging perihepatic fluid collections which are worrisome for infection/abscess.  These are subdiaphragmatic and all  along the periphery of the liver.  There are clearly enlarged since prior scans and are irregular in appearance.  There are areas of similar findings involving the medial and lower aspect of the spleen which could be a small abscesses.  Persistent fluid around the gallbladder and marked thickening of the proximal duodenum.  A small scattered fluid collections are noted in the abdomen without discrete rim enhancement.  There is a new fluid collection in the retroperitoneum on the right side. Close observation is recommended.  The drainage catheter is in place.  No residual anterior abdominal wall abscess.  The small bowel and colon are grossly normal and stable.  The ileal colonic anastomoses appears stable.  There is persistent diffuse interstitial inflammatory changes in the right abdomen with mesenteric thickening and nodularity.  The uterus and ovaries are unremarkable except for left ovarian cyst.  No pelvic mass or lymphadenopathy.  There is a small amount of free pelvic fluid.  No inguinal mass or hernia.  Open anterior abdominal wound is again noted.  IMPRESSION:  1.  Enlarging irregular perihepatic fluid collections most likely representing rind of abscess surrounding the liver capsule. 2. Perisplenic fluid collections are also present and could also reflect abscess. 3.  Persistent fluid around the gallbladder and marked wall thickening of the proximal duodenum. 4.  Persistent peritoneal inflammatory changes with marked interstitial thickening inflammation and edema along with nodularity and small lymph nodes. 5.  New right-sided retroperitoneal fluid collection without obvious rim enhancement.  Close observation recommended.  6.  Persistent moderate-sized bilateral pleural effusions with compressive atelectasis. 7.  Complete and adequate drainage of the anterior abdominal wall abscess.   Original Report Authenticated By: P. Loralie Champagne, M.D.     ROS:  As stated above in the HPI otherwise  negative.  Blood pressure 165/60, pulse 77, temperature 98.1 F (36.7 C), temperature source Oral, resp. rate 18, height 5\' 2"  (1.575 m), weight 71.9 kg (158 lb 8.2 oz), SpO2 100.00%.    PE: Gen: NAD, Alert and Oriented, weak appearing. HEENT:  [place]/AT, EOMI Neck: Supple, no LAD Lungs: CTA Bilaterally CV: RRR without M/G/R ABM: Soft, NTND, +BS Ext: No C/C/E  Assessment/Plan: 1) Thickened duodenum. 2) Persistent nausea.   The etiology of the thickening is unclear.  Further evaluation with an EGD is required.  I doubt that it is Crohn's disease as it is rare to see it in this location, but evaluation is warranted.  Plan: 1) EGD tomorrow AM.  Jeani Hawking D 02/18/2012, 3:12 PM

## 2012-02-19 ENCOUNTER — Encounter (HOSPITAL_COMMUNITY): Payer: Self-pay | Admitting: *Deleted

## 2012-02-19 ENCOUNTER — Encounter (HOSPITAL_COMMUNITY): Admission: EM | Disposition: A | Discharge status: Home Health | Payer: Self-pay | Source: Home / Self Care

## 2012-02-19 ENCOUNTER — Other Ambulatory Visit: Payer: Self-pay

## 2012-02-19 HISTORY — PX: ESOPHAGOGASTRODUODENOSCOPY: SHX5428

## 2012-02-19 LAB — COMPREHENSIVE METABOLIC PANEL
Albumin: 1.4 g/dL — ABNORMAL LOW (ref 3.5–5.2)
Alkaline Phosphatase: 126 U/L — ABNORMAL HIGH (ref 39–117)
BUN: 6 mg/dL (ref 6–23)
Potassium: 4.6 mEq/L (ref 3.5–5.1)
Sodium: 136 mEq/L (ref 135–145)
Total Protein: 5.1 g/dL — ABNORMAL LOW (ref 6.0–8.3)

## 2012-02-19 LAB — CBC
HCT: 27.8 % — ABNORMAL LOW (ref 36.0–46.0)
MCHC: 32.4 g/dL (ref 30.0–36.0)
RDW: 16.9 % — ABNORMAL HIGH (ref 11.5–15.5)

## 2012-02-19 SURGERY — EGD (ESOPHAGOGASTRODUODENOSCOPY)
Anesthesia: Moderate Sedation

## 2012-02-19 MED ORDER — MIDAZOLAM HCL 10 MG/2ML IJ SOLN
INTRAMUSCULAR | Status: AC
  Filled 2012-02-19: qty 2

## 2012-02-19 MED ORDER — FENTANYL CITRATE 0.05 MG/ML IJ SOLN
INTRAMUSCULAR | Status: AC
  Filled 2012-02-19: qty 2

## 2012-02-19 MED ORDER — FENTANYL CITRATE 0.05 MG/ML IJ SOLN
25.0000 ug | INTRAMUSCULAR | Status: DC | PRN
  Administered 2012-02-20 – 2012-02-23 (×9): 50 ug via INTRAVENOUS
  Administered 2012-02-23: 25 ug via INTRAVENOUS
  Administered 2012-02-24 (×2): 50 ug via INTRAVENOUS
  Administered 2012-02-25: 25 ug via INTRAVENOUS
  Administered 2012-02-25 – 2012-02-28 (×2): 50 ug via INTRAVENOUS
  Administered 2012-03-02 (×2): 25 ug via INTRAVENOUS
  Administered 2012-03-04 (×2): 50 ug via INTRAVENOUS
  Filled 2012-02-19 (×22): qty 2

## 2012-02-19 MED ORDER — ACETAMINOPHEN 325 MG PO TABS: 650.0000 mg | ORAL_TABLET | ORAL | Status: DC | PRN

## 2012-02-19 MED ORDER — PANTOPRAZOLE SODIUM 40 MG PO TBEC
40.0000 mg | DELAYED_RELEASE_TABLET | Freq: Two times a day (BID) | ORAL | Status: DC
  Administered 2012-02-19 – 2012-03-07 (×35): 40 mg via ORAL
  Filled 2012-02-19 (×39): qty 1

## 2012-02-19 MED ORDER — SUCRALFATE 1 GM/10ML PO SUSP
1.0000 g | Freq: Three times a day (TID) | ORAL | Status: DC
  Administered 2012-02-19 – 2012-03-07 (×66): 1 g via ORAL
  Filled 2012-02-19 (×73): qty 10

## 2012-02-19 MED ORDER — FENTANYL CITRATE 0.05 MG/ML IJ SOLN
INTRAMUSCULAR | Status: DC | PRN
  Administered 2012-02-19 (×2): 25 ug via INTRAVENOUS

## 2012-02-19 MED ORDER — OXYCODONE-ACETAMINOPHEN 5-325 MG PO TABS
1.0000 | ORAL_TABLET | ORAL | Status: DC | PRN
  Administered 2012-02-19 – 2012-02-20 (×4): 2 via ORAL
  Administered 2012-02-21 (×4): 1 via ORAL
  Administered 2012-02-24 – 2012-03-04 (×23): 2 via ORAL
  Administered 2012-03-05: 1 via ORAL
  Administered 2012-03-06: 2 via ORAL
  Administered 2012-03-06 – 2012-03-07 (×2): 1 via ORAL
  Filled 2012-02-19 (×2): qty 2
  Filled 2012-02-19: qty 1
  Filled 2012-02-19 (×4): qty 2
  Filled 2012-02-19: qty 1
  Filled 2012-02-19 (×4): qty 2
  Filled 2012-02-19: qty 1
  Filled 2012-02-19 (×14): qty 2
  Filled 2012-02-19: qty 1
  Filled 2012-02-19: qty 2
  Filled 2012-02-19: qty 1
  Filled 2012-02-19 (×11): qty 2

## 2012-02-19 MED ORDER — MIDAZOLAM HCL 10 MG/2ML IJ SOLN
INTRAMUSCULAR | Status: DC | PRN
  Administered 2012-02-19: 1 mg via INTRAVENOUS
  Administered 2012-02-19: 2 mg via INTRAVENOUS

## 2012-02-19 NOTE — Progress Notes (Signed)
Physical Therapy Treatment Patient Details Name: Michele David MRN: [id number] DOB: 1945-11-25 Today's Date: 02/19/2012 Time: 1191-4782 PT Time Calculation (min): 28 min  PT Assessment / Plan / Recommendation Comments on Treatment Session  Strength and activity tolerance slowly improving. Pt continues to fatigue and become dyspneic fairly easily. Pt states her son can "carry me up stairs if needed."     Follow Up Recommendations  Home health PT;Supervision/Assistance - 24 hour     Does the patient have the potential to tolerate intense rehabilitation  No, Recommend SNF-pt refusing.   Barriers to Discharge        Equipment Recommendations  Rolling walker with 5" wheels;3 in 1 bedside comode    Recommendations for Other Services    Frequency Min 3X/week   Plan Discharge plan remains appropriate    Precautions / Restrictions Precautions Precautions: Fall Restrictions Weight Bearing Restrictions: No   Pertinent Vitals/Pain Abdomen-unrated    Mobility  Bed Mobility Bed Mobility: Supine to Sit;Sit to Supine Supine to Sit: 4: Min guard;HOB elevated;With rails Sit to Supine: 4: Min guard;HOB elevated;With rail Transfers Transfers: Sit to Stand;Stand to Sit Sit to Stand: 4: Min guard;From bed;From elevated surface Stand to Sit: 4: Min guard;To bed;To elevated surface Details for Transfer Assistance: VCs safety, hand placement.  Ambulation/Gait Ambulation/Gait Assistance: 4: Min guard Ambulation Distance (Feet): 100 Feet (x 2) Assistive device: Rolling walker Ambulation/Gait Assistance Details: Dyspnea 2-3/4 with ambulation. Seated rest break needed in between walks. Still slightly unsteady.  Gait Pattern: Step-through pattern;Decreased stride length    Exercises     PT Diagnosis:    PT Problem List:   PT Treatment Interventions:     PT Goals Acute Rehab PT Goals Pt will go Supine/Side to Sit: Independently PT Goal: Supine/Side to Sit - Progress: Progressing  toward goal Pt will go Sit to Supine/Side: Independently PT Goal: Sit to Supine/Side - Progress: Progressing toward goal Pt will go Sit to Stand: with modified independence PT Goal: Sit to Stand - Progress: Progressing toward goal Pt will go Stand to Sit: with modified independence PT Goal: Stand to Sit - Progress: Progressing toward goal Pt will Ambulate: 51 - 150 feet;with modified independence;with least restrictive assistive device PT Goal: Ambulate - Progress: Progressing toward goal  Visit Information  Last PT Received On: 02/19/12 Assistance Needed: +1    Subjective Data  Subjective: "It's been al long day today" Patient Stated Goal: Home   Cognition  Overall Cognitive Status: Appears within functional limits for tasks assessed/performed Arousal/Alertness: Awake/David Orientation Level: Appears intact for tasks assessed Behavior During Session: [facility] for tasks performed    Balance     End of Session PT - End of Session Activity Tolerance: Patient tolerated treatment well;Patient limited by fatigue Patient left: in bed;with call bell/phone within reach   GP     Michele David [facility] 02/19/2012, 4:20 PM [id number]

## 2012-02-19 NOTE — Progress Notes (Signed)
Day of Surgery  Subjective: Pt had EGD earlier today; has had intermittent abd cramping; no nausea/vomiting; no sig dyspnea at present  Objective: Vital signs in last 24 hours: Temp:  [98.1 F (36.7 C)-98.5 F (36.9 C)] 98.3 F (36.8 C) (11/01 0943) Pulse Rate:  [68-110] 96  (11/01 0830) Resp:  [16-61] 20  (11/01 0943) BP: (139-180)/(57-84) 171/57 mmHg (11/01 0943) SpO2:  [95 %-100 %] 99 % (11/01 0943) Last BM Date: 03/17/12  Intake/Output from previous day: 10/31 0701 - 11/01 0700 In: 2052.5 [P.O.:180; I.V.:1872.5] Out: 750 [Urine:750] Intake/Output this shift: Total I/O In: 100 [P.O.:100] Out: 300 [Urine:300]  Rt ant abd drain intact, output minimal amt light yellow fluid, insertion site ok  Lab Results:   [facility] 02/19/12 0455 02/17/12 0520  WBC 17.5* 19.2*  HGB 9.0* 9.7*  HCT 27.8* 29.3*  PLT 436* 455*   BMET  Basename 02/19/12 0455 02/18/12 0522  NA 136 135  K 4.6 4.2  CL 109 105  CO2 19 22  GLUCOSE 148* 140*  BUN 6 7  CREATININE 0.66 0.78  CALCIUM 7.3* 7.5*   PT/INR No results found for this basename: LABPROT:2,INR:2 in the last 72 hours ABG No results found for this basename: PHART:2,PCO2:2,PO2:2,HCO3:2 in the last 72 hours Results for orders placed during the hospital encounter of 02/06/12  URINE CULTURE     Status: Normal   Collection Time   02/06/12  2:15 PM      Component Value Range Status Comment   Specimen Description URINE, CLEAN CATCH   Final    Special Requests NONE   Final    Culture  Setup Time 02/06/2012 19:08   Final    Colony Count 20,OOO COLONIES/ML   Final    Culture     Final    Value: Multiple bacterial morphotypes present, none predominant. Suggest appropriate recollection if clinically indicated.   Report Status 02/07/2012 FINAL   Final   MRSA PCR SCREENING     Status: Normal   Collection Time   02/07/12 12:42 AM      Component Value Range Status Comment   MRSA by PCR NEGATIVE  NEGATIVE Final   CULTURE, ROUTINE-ABSCESS      Status: Normal   Collection Time   02/07/12 10:22 AM      Component Value Range Status Comment   Specimen Description DRAINAGE   Final    Special Requests Normal   Final    Gram Stain     Final    Value: ABUNDANT WBC PRESENT, PREDOMINANTLY PMN     NO SQUAMOUS EPITHELIAL CELLS SEEN     NO ORGANISMS SEEN   Culture NO GROWTH 3 DAYS   Final    Report Status 02/10/2012 FINAL   Final   BODY FLUID CULTURE     Status: Normal   Collection Time   02/12/12  9:53 AM      Component Value Range Status Comment   Specimen Description PLEURAL RIGHT   Final    Special Requests Normal   Final    Gram Stain     Final    Value: FEW WBC PRESENT, PREDOMINANTLY PMN     NO ORGANISMS SEEN   Culture NO GROWTH 3 DAYS   Final    Report Status 02/16/2012 FINAL   Final   CLOSTRIDIUM DIFFICILE BY PCR     Status: Normal   Collection Time   02/14/12 10:00 AM      Component Value Range Status Comment  C difficile by pcr NEGATIVE  NEGATIVE Final     Studies/Results: Dg Chest 2 View  02/18/2012  *RADIOLOGY REPORT*  Clinical Data: Cough, nausea.  CHEST - 2 VIEW  Comparison: 02/16/2012  Findings: Cardiomegaly.  Moderate right pleural effusion with right lower lobe airspace disease, not significantly changed.  Left basilar atelectasis or infiltrate also present.  Possible small left effusion.  No acute bony abnormality.  IMPRESSION: Stable exam.  Bilateral lower lobe airspace opacities and effusions, right greater than left.   Original Report Authenticated By: Charlett Nose, M.D.     Anti-infectives: Anti-infectives     Start     Dose/Rate Route Frequency Ordered Stop   02/14/12 1000   metroNIDAZOLE (FLAGYL) IVPB 500 mg  Status:  Discontinued        500 mg 100 mL/hr over 60 Minutes Intravenous Every 6 hours 02/14/12 0924 02/16/12 1043   02/08/12 1400   ceFEPIme (MAXIPIME) 1 g in dextrose 5 % 50 mL IVPB  Status:  Discontinued        1 g 100 mL/hr over 30 Minutes Intravenous 3 times per day 02/08/12 1137  02/08/12 1141   02/08/12 1400   piperacillin-tazobactam (ZOSYN) IVPB 3.375 g  Status:  Discontinued        3.375 g 12.5 mL/hr over 240 Minutes Intravenous Every 8 hours 02/08/12 1145 02/18/12 0721   02/08/12 1300   levofloxacin (LEVAQUIN) IVPB 750 mg  Status:  Discontinued        750 mg 100 mL/hr over 90 Minutes Intravenous Every 24 hours 02/08/12 1137 02/08/12 1141   02/06/12 1800   vancomycin (VANCOCIN) IVPB 1000 mg/200 mL premix  Status:  Discontinued        1,000 mg 200 mL/hr over 60 Minutes Intravenous Every 12 hours 02/06/12 1448 02/08/12 1133   02/06/12 1445   ceFEPIme (MAXIPIME) 1 g in dextrose 5 % 50 mL IVPB  Status:  Discontinued        1 g 100 mL/hr over 30 Minutes Intravenous 3 times per day 02/06/12 1431 02/08/12 1137   02/06/12 1445   levofloxacin (LEVAQUIN) IVPB 750 mg  Status:  Discontinued        750 mg 100 mL/hr over 90 Minutes Intravenous Every 24 hours 02/06/12 1431 02/08/12 1137          Assessment/Plan: s/p rt ant abd fluid coll drainage 10/20; following d/w CCS decision made to remove abd drain. Drain removed in its entirety without immediate complications. Gauze dressing applied to site.    LOS: 13 days    Tawney Vanorman,D [facility] 02/19/2012

## 2012-02-19 NOTE — Progress Notes (Signed)
Calorie Count Note  Intervention: Recommend MD consider enteral nutrition as pt with minimal intake for the past week. Recommend nursing continue to encourage meal and nutrition supplement intake. Appetite stimulant may be beneficial as well but would likely take several days to be effective.   48 hour calorie count ordered. Ends today.   Diet: Regular  Supplements: Ensure pudding TID  10/31 Breakfast: 40 calories, 1g protein Lunch: Did not eat anything because of nausea Dinner: 85 calories, 4g protein Snacks: 200 calories, 3g protein  11/1 Breakfast: Did not eat, was down for EGD Lunch: 250 calories, 5g protein  - Pt with incomplete results of calorie count as pt has missed some meals in the past 2 days. Pt with Ensure pudding at bedside, stated she would try to eat it later on today.   Total intake for 10/31: 325 kcal (18% of minimum estimated needs)  8g protein (9% of minimum estimated needs)  Nutrition Dx: Inadequate oral intake - ongoing  Goal: Pt to consume >75% of meals/supplements - not met  Levon Hedger MS, RD, LDN [phone] Pager [phone] After Hours Pager

## 2012-02-19 NOTE — Interval H&P Note (Signed)
History and Physical Interval Note:  02/19/2012 8:16 AM  Michele David  has presented today for surgery, with the diagnosis of Abnormal CT scan  The various methods of treatment have been discussed with the patient and family. After consideration of risks, benefits and other options for treatment, the patient has consented to  Procedure(s) (LRB) with comments: ESOPHAGOGASTRODUODENOSCOPY (EGD) (N/A) as a surgical intervention .  The patient's history has been reviewed, patient examined, no change in status, stable for surgery.  I have reviewed the patient's chart and labs.  Questions were answered to the patient's satisfaction.     Yasmine Kilbourne D

## 2012-02-19 NOTE — Progress Notes (Addendum)
Day of Surgery  Subjective: Just back From EGD.  Says nothing tasted good so she didn't eat much yesterday.  Not really complaining of nausea now.   Objective: Vital signs in last 24 hours: Temp:  [98.1 F (36.7 C)-98.5 F (36.9 C)] 98.2 F (36.8 C) (11/01 0838) Pulse Rate:  [68-110] 96  (11/01 0830) Resp:  [16-61] 20  (11/01 0910) BP: (139-180)/(58-84) 180/79 mmHg (11/01 0910) SpO2:  [95 %-100 %] 98 % (11/01 0910) Last BM Date: 03/17/12  180 PO recorded yesterday, nothing recorded from drain, 3 stools recorded, Diet: regular, afebrile, systolic BP is up some,  Labs show  Some improvement in WBC, CMP is stable.,bilat pleural effusions; right greater than left.  Intake/Output from previous day: 10/31 0701 - 11/01 0700 In: 2052.5 [P.O.:180; I.V.:1872.5] Out: 750 [Urine:750] Intake/Output this shift: Total I/O In: 100 [P.O.:100] Out: 100 [Urine:100]  General appearance: alert, cooperative and no distress Resp: BS down in bases. GI: soft, much less tender than eariler this week, few BS.  wound looks better, still some fibrous white tissue at the base lower portion of the wound.  i  Lab Results:   [facility] 02/19/12 0455 02/17/12 0520  WBC 17.5* 19.2*  HGB 9.0* 9.7*  HCT 27.8* 29.3*  PLT 436* 455*    BMET  Basename 02/19/12 0455 02/18/12 0522  NA 136 135  K 4.6 4.2  CL 109 105  CO2 19 22  GLUCOSE 148* 140*  BUN 6 7  CREATININE 0.66 0.78  CALCIUM 7.3* 7.5*   PT/INR No results found for this basename: LABPROT:2,INR:2 in the last 72 hours   Lab 02/19/12 0455 02/17/12 0520  AST 8 10  ALT <5 6  ALKPHOS 126* 118*  BILITOT 0.3 0.5  PROT 5.1* 5.4*  ALBUMIN 1.4* 1.5*     Lipase     Component Value Date/Time   LIPASE 8* 01/23/2012 1656     Studies/Results: Dg Chest 2 View  02/18/2012  *RADIOLOGY REPORT*  Clinical Data: Cough, nausea.  CHEST - 2 VIEW  Comparison: 02/16/2012  Findings: Cardiomegaly.  Moderate right pleural effusion with right lower lobe airspace  disease, not significantly changed.  Left basilar atelectasis or infiltrate also present.  Possible small left effusion.  No acute bony abnormality.  IMPRESSION: Stable exam.  Bilateral lower lobe airspace opacities and effusions, right greater than left.   Original Report Authenticated By: Charlett Nose, M.D.    Ct Abdomen Pelvis W Contrast  02/17/2012  *RADIOLOGY REPORT*  Clinical Data: Follow-up intra-abdominal abscess.  CT ABDOMEN AND PELVIS WITH CONTRAST  Technique:  Multidetector CT imaging of the abdomen and pelvis was performed following the standard protocol during bolus administration of intravenous contrast.  Contrast: OMNIPAQUE IOHEXOL 300 MG/ML  SOLN  Comparison: Multiple prior CT scans.  Findings: The lung bases demonstrate persistent moderate sized pleural effusions, right greater than left with compressive atelectasis.  No pericardial effusion.  There are enlarging perihepatic fluid collections which are worrisome for infection/abscess.  These are subdiaphragmatic and all along the periphery of the liver.  There are clearly enlarged since prior scans and are irregular in appearance.  There are areas of similar findings involving the medial and lower aspect of the spleen which could be a small abscesses.  Persistent fluid around the gallbladder and marked thickening of the proximal duodenum.  A small scattered fluid collections are noted in the abdomen without discrete rim enhancement.  There is a new fluid collection in the retroperitoneum on the right side.  Close observation is recommended.  The drainage catheter is in place.  No residual anterior abdominal wall abscess.  The small bowel and colon are grossly normal and stable.  The ileal colonic anastomoses appears stable.  There is persistent diffuse interstitial inflammatory changes in the right abdomen with mesenteric thickening and nodularity.  The uterus and ovaries are unremarkable except for left ovarian cyst.  No pelvic mass or  lymphadenopathy.  There is a small amount of free pelvic fluid.  No inguinal mass or hernia.  Open anterior abdominal wound is again noted.  IMPRESSION:  1.  Enlarging irregular perihepatic fluid collections most likely representing rind of abscess surrounding the liver capsule. 2. Perisplenic fluid collections are also present and could also reflect abscess. 3.  Persistent fluid around the gallbladder and marked wall thickening of the proximal duodenum. 4.  Persistent peritoneal inflammatory changes with marked interstitial thickening inflammation and edema along with nodularity and small lymph nodes. 5.  New right-sided retroperitoneal fluid collection without obvious rim enhancement.  Close observation recommended.  6.  Persistent moderate-sized bilateral pleural effusions with compressive atelectasis. 7.  Complete and adequate drainage of the anterior abdominal wall abscess.   Original Report Authenticated By: P. Loralie Champagne, M.D.     Medications:    . acetaminophen  650 mg Rectal Once  . antiseptic oral rinse  15 mL Mouth Rinse BID  . famotidine  20 mg Oral BID  . feeding supplement  1 Container Oral TID BM  . furosemide  20 mg Oral Daily  . heparin  5,000 Units Subcutaneous Q8H  . pantoprazole (PROTONIX) IV  40 mg Intravenous Q12H  . potassium chloride  30 mEq Oral TID PC  . sucralfate  1 g Oral TID WC & HS  . DISCONTD: feeding supplement  237 mL Oral BID BM    Assessment/Plan Crohn's disease with perforation, peritonitis, s/p Resection of ileocolonic anastomosis with perforation.  Ileocolonic anastomosis between mid ileum and proximal transverse colon. Michele Heckler, MD, 01/24/2012.  Path: Crohn's disease with ulceration and transmucosal inflammation  Abscess beneath the abdominal wall with CT guided drainage of abscess 02/07/12.  cefepime and levofloxacin started 10/19 for 3 days, Vancomycin 2 days on 10/19-10/20, switched to zosyn 10/21.  Flagyl started over weekend for possible C  diff which is negative.  CT yesterday shows thickening of the proximal duodenum,peritoneal inflammatory changes with edema and some lymphadenopathy, enlarging collections around the Liver and spleen, and new right sided retroperitoneal fluid collections without rim enhancement, .  Post op fluid overload/hypoxemia on admit due to atelectasis/pleural effusions  PLEURAL EFFUSION ON right. S/p right thoracentesis 1.1 liter 02/12/12 - some increase in right pleural fluid on CXR. Prior cytology shows, chronic inflammation, and atypical mesothelial cells.  Chronic lymphedema of the RLE, unknown etiology  Hypertension  Hyperlipemia  S/P EGD 02/19/12: Schatzki's ring, with dilitation from 15 mm up to 18.5, with small mucosal tear.  Nonbleeding gastric ulcers.   Plan: PPI and Carafate, there is about 10 ml in the drainage bag, and it is clear yellow, abdominal wound looks better, consider wound vac, still awaiting calorie count, but I'm fairly sure she's not taking in enough.  She seems stable from the effusions.   Dr. Elnoria Howard has her on carafate and PPI.  She is taking allot of fentanyl for abdominal pain so I am going to decrease that give her a PO alternatives today. Discussed drain with Dr. Biagio Quint and IR, IR plans to pull drain. She has lost  her IV site and nursing cannot get another line in.  We are going to try her on PO's, encourage intake and try to avoid another IV.  We have suggested a feeding tube if PO intake is poor.  She is currently working on a plate of chicken tenders.  Hopefully she will improve and be ready for d/c soon.  LOS: 13 days    Gerstel,Yuridiana Formanek 02/19/2012  Awaiting calorie count.  Tolerating diet but small amounts.  Gastric ulcers on EGD which could be causing her nausea.  Duodenum looked okay.  Her abdomen is okay.  IR managing abdominal drain but should be able to come out anytime.  Wbc down despite stopping abx.  When she is able to eat and maintain nutrition, we will be able to  plan for discharge.

## 2012-02-19 NOTE — Op Note (Signed)
[facility] [address] [place], [address]   OPERATIVE PROCEDURE REPORT  PATIENT: Michele David, Michele David  MR#: [id number] BIRTHDATE: [birth year]  GENDER: Female ENDOSCOPIST: Jeani Hawking, MD ASSISTANT:   Kandice Robinsons, technician, Blair Dolphin, technician, and Jimmey Ralph, RN, CGRN PROCEDURE DATE: 02/19/2012 PROCEDURE:   Savary dilation of esophagus ASA CLASS:   Class IV INDICATIONS:abnormal CT of the GI tract. MEDICATIONS: Versed 3 mg IV and Fentanyl 50 mcg IV TOPICAL ANESTHETIC:   none  DESCRIPTION OF PROCEDURE:   After the risks benefits and alternatives of the procedure were thoroughly explained, informed consent was obtained.  The Pentax Gastroscope [id number]  endoscope was introduced through the mouth  and advanced to the second portion of the duodenum Without limitations.      The instrument was slowly withdrawn as the mucosa was fully examined.    FINDINGS: In the distal esophagus a nonobstructive Schatzki's ring was identified, but it was a little difficult to traverse the areas, subjectively.  A 3 cm hiatal hernia was identified and in the proximal protion of the hernia sac was an ulceration.  In the gastric body there was another ulceration and both appeared to be peptic.  The duodenum was traversed with ease and there was no evidence of any mucosal edema or inflammation.  No evidence of small bowel Crohn's disease.   Because of the thought that her distal esophagus may be narrowed balloon dilation was performed from 15 mm up to 18.5 mm.  Even at 18.5 mm the balloon was still able top be moved freely, however, there was evidence of a small mucosal tear from the dilation.  This is expected with dilation. Retroflexed views revealed no abnormalities.     The scope was then withdrawn from the patient and the procedure terminated.  COMPLICATIONS: There were no complications. IMPRESSION: 1) Schatzki's ring. 2) Nonbleeding gastric  ulcers..  RECOMMENDATIONS: 1) Resume diet and follow clinically. 2) Add sucralfate to the Protonix.   _______________________________ eSignedJeani Hawking, MD 02/19/2012 8:59 AM

## 2012-02-19 NOTE — H&P (View-Only) (Signed)
Subjective: She feels a little better, complains of gas like discomfort with PO's but not as bad as before. No complaints with breathing, but it sounds like she's not going very far.  Objective: Vital signs in last 24 hours: Temp:  [98.7 F (37.1 C)-99.4 F (37.4 C)] 99.4 F (37.4 C) (10/31 0604) Pulse Rate:  [66-74] 74  (10/31 0604) Resp:  [18-20] 18  (10/31 0604) BP: (149-182)/(67-74) 170/67 mmHg (10/31 0604) SpO2:  [95 %-100 %] 98 % (10/31 0604) Last BM Date: 02/15/12  1180 ml PO recorded yesterday, nothing from the drain recorded yesterday. TM 99.4, BP up some, BMP is OK, K+ has been replaced  Intake/Output from previous day: 10/30 0701 - 10/31 0700 In: 3755.3 [P.O.:1180; I.V.:2425.3; IV Piggyback:150] Out: 850 [Urine:850] Intake/Output this shift: Total I/O In: -  Out: 200 [Urine:200]  General appearance: alert, cooperative and no distress Resp: clear to auscultation bilaterally and some decrease right base. GI: soft, some bowel sounds she is not distended, Open wound still has some exudate at base, but looks better than Monday.,  Lab Results:   Basename 02/17/12 0520  WBC 19.2*  HGB 9.7*  HCT 29.3*  PLT 455*    BMET  Basename 02/18/12 0522 02/17/12 0520  NA 135 133*  K 4.2 2.9*  CL 105 102  CO2 22 21  GLUCOSE 140* 155*  BUN 7 8  CREATININE 0.78 0.70  CALCIUM 7.5* 7.7*   PT/INR No results found for this basename: LABPROT:2,INR:2 in the last 72 hours   Lab 02/17/12 0520  AST 10  ALT 6  ALKPHOS 118*  BILITOT 0.5  PROT 5.4*  ALBUMIN 1.5*     Lipase     Component Value Date/Time   LIPASE 8* 01/23/2012 1656     Studies/Results: Ct Abdomen Pelvis W Contrast  02/17/2012  *RADIOLOGY REPORT*  Clinical Data: Follow-up intra-abdominal abscess.  CT ABDOMEN AND PELVIS WITH CONTRAST  Technique:  Multidetector CT imaging of the abdomen and pelvis was performed following the standard protocol during bolus administration of intravenous contrast.   Contrast: 100mL OMNIPAQUE IOHEXOL 300 MG/ML  SOLN  Comparison: Multiple prior CT scans.  Findings: The lung bases demonstrate persistent moderate sized pleural effusions, right greater than left with compressive atelectasis.  No pericardial effusion.  There are enlarging perihepatic fluid collections which are worrisome for infection/abscess.  These are subdiaphragmatic and all along the periphery of the liver.  There are clearly enlarged since prior scans and are irregular in appearance.  There are areas of similar findings involving the medial and lower aspect of the spleen which could be a small abscesses.  Persistent fluid around the gallbladder and marked thickening of the proximal duodenum.  A small scattered fluid collections are noted in the abdomen without discrete rim enhancement.  There is a new fluid collection in the retroperitoneum on the right side. Close observation is recommended.  The drainage catheter is in place.  No residual anterior abdominal wall abscess.  The small bowel and colon are grossly normal and stable.  The ileal colonic anastomoses appears stable.  There is persistent diffuse interstitial inflammatory changes in the right abdomen with mesenteric thickening and nodularity.  The uterus and ovaries are unremarkable except for left ovarian cyst.  No pelvic mass or lymphadenopathy.  There is a small amount of free pelvic fluid.  No inguinal mass or hernia.  Open anterior abdominal wound is again noted.  IMPRESSION:  1.  Enlarging irregular perihepatic fluid collections most likely representing rind   of abscess surrounding the liver capsule. 2. Perisplenic fluid collections are also present and could also reflect abscess. 3.  Persistent fluid around the gallbladder and marked wall thickening of the proximal duodenum. 4.  Persistent peritoneal inflammatory changes with marked interstitial thickening inflammation and edema along with nodularity and small lymph nodes. 5.  New right-sided  retroperitoneal fluid collection without obvious rim enhancement.  Close observation recommended.  6.  Persistent moderate-sized bilateral pleural effusions with compressive atelectasis. 7.  Complete and adequate drainage of the anterior abdominal wall abscess.   Original Report Authenticated By: P. MARK GALLERANI, M.D.     Medications:    . acetaminophen  650 mg Rectal Once  . antiseptic oral rinse  15 mL Mouth Rinse BID  . famotidine  20 mg Oral BID  . feeding supplement  237 mL Oral BID BM  . furosemide  20 mg Oral Daily  . heparin  5,000 Units Subcutaneous Q8H  . pantoprazole (PROTONIX) IV  40 mg Intravenous Q12H  . potassium chloride  30 mEq Oral TID PC  . DISCONTD: feeding supplement  237 mL Oral BID BM  . DISCONTD: piperacillin-tazobactam (ZOSYN)  IV  3.375 g Intravenous Q8H    Assessment/Plan Crohn's disease with perforation, peritonitis, s/p Resection of ileocolonic anastomosis with perforation.  Ileocolonic anastomosis between mid ileum and proximal transverse colon. Todd M Gerkin, MD, 01/24/2012.  Path: Crohn's disease with ulceration and transmucosal inflammation  Abscess beneath the abdominal wall with CT guided drainage of abscess 02/07/12.  cefepime and levofloxacin started 10/19 for 3 days, Vancomycin 2 days on 10/19-10/20, switched to zosyn 10/21.  Flagyl started over weekend for possible C diff which is negative.  CT yesterday shows thickening of the proximal duodenum,peritoneal inflammatory changes with edema and some lymphadenopathy,  enlarging collections around the  Liver and spleen, and new right sided retroperitoneal fluid collections without rim enhancement, . Post op fluid overload/hypoxemia on admit due to atelectasis/pleural effusions  PLEURAL EFFUSION ON right. S/p right thoracentesis 1.1 liter 02/12/12 - some increase in right pleural fluid on CXR. Prior cytology shows, chronic inflammation, and atypical mesothelial cells.  Chronic lymphedema of the RLE,  unknown etiology  Hypertension  Hyperlipemia    Plan:  Ask GI to see and evaluate for her Crohn's, we are stopping her antibiotics, she has been on them since 10/19.  Ask dietary to monitor for calorie count, she doesn't eat much, and if they bring her the wrong thing she won't eat at all.  She seems stable from her effusion clinically and we will recheck labs and cxr tomorrow.     LOS: 12 days    Mcgurk,WILLARD 02/18/2012  She tolerated regular diet yesterday. Just nausea but no vomiting.  She has small fluid collections in her abdomen but nothing drainable.  GI consult for crohns.  Calorie counts to see if she will need additional nutrition support. 

## 2012-02-20 ENCOUNTER — Inpatient Hospital Stay (HOSPITAL_COMMUNITY): Payer: Medicare Other

## 2012-02-20 DIAGNOSIS — K5000 Crohn's disease of small intestine without complications: Secondary | ICD-10-CM

## 2012-02-20 DIAGNOSIS — R69 Illness, unspecified: Secondary | ICD-10-CM

## 2012-02-20 LAB — OCCULT BLOOD X 1 CARD TO LAB, STOOL: Fecal Occult Bld: NEGATIVE

## 2012-02-20 LAB — PREALBUMIN: Prealbumin: 7.2 mg/dL — ABNORMAL LOW (ref 17.0–34.0)

## 2012-02-20 MED ORDER — ONDANSETRON HCL 4 MG/2ML IJ SOLN
4.0000 mg | Freq: Four times a day (QID) | INTRAMUSCULAR | Status: DC
  Administered 2012-02-20 – 2012-03-07 (×54): 4 mg via INTRAVENOUS
  Filled 2012-02-20 (×52): qty 2

## 2012-02-20 MED ORDER — SODIUM CHLORIDE 0.9 % IJ SOLN
10.0000 mL | INTRAMUSCULAR | Status: DC | PRN
  Administered 2012-02-20 – 2012-03-04 (×2): 20 mL
  Administered 2012-03-07: 10 mL

## 2012-02-20 MED ORDER — PIPERACILLIN-TAZOBACTAM 3.375 G IVPB
3.3750 g | Freq: Four times a day (QID) | INTRAVENOUS | Status: DC
  Administered 2012-02-20 – 2012-02-23 (×13): 3.375 g via INTRAVENOUS
  Filled 2012-02-20 (×15): qty 50

## 2012-02-20 NOTE — Progress Notes (Addendum)
Peripherally Inserted Central Catheter/Midline Placement  The IV Nurse has discussed with the patient and/or persons authorized to consent for the patient, the purpose of this procedure and the potential benefits and risks involved with this procedure.  The benefits include less needle sticks, lab draws from the catheter and patient may be discharged home with the catheter.  Risks include, but not limited to, infection, bleeding, blood clot (thrombus formation), and puncture of an artery; nerve damage and irregular heat beat.  Alternatives to this procedure were also discussed.  PICC/Midline Placement Documentation  PICC / Midline Single Lumen 02/20/12 PICC Right Basilic (Active)  Indication for Insertion or Continuance of Line Prolonged intravenous therapies 02/20/2012 12:00 PM    02/20/12 @ 1320 - spoke with Dr. Bradly Chris and explained that PICC was placed with ECG, but failed to save picture and Dr. Bradly Chris stated, okay to leave tip in current position.   Stacie Glaze Horton 02/20/2012, 12:21 PM

## 2012-02-20 NOTE — Progress Notes (Signed)
Patient ID: Michele David, female   DOB: [birth year], 66 y.o.   MRN: [id number]  [facility] Progress Note Covering for Dr. Elnoria Howard  Subjective: Unable to eat much, no appetite and increased pain with po intake. Nausea, no vomiting. Having 3-4 loose BM's daily  Objective:  Vital signs in last 24 hours: Temp:  [97.8 F (36.6 C)-99.1 F (37.3 C)] 99.1 F (37.3 C) (11/01 2151) Pulse Rate:  [75] 75  (11/01 2151) Resp:  [18-32] 32  (11/01 2151) BP: (147-185)/(59-70) 185/70 mmHg (11/01 2151) SpO2:  [95 %-97 %] 95 % (11/01 2151) Last BM Date: 02/15/12 General:   Alert,  Well-developed, ill appearing, pale AA female   in NAD Heart:  Regular rate and rhythm; no murmurs Pulm;clear bilaterally Abdomen:  Soft,full ,diffusely tender , no rebound. Drain is out, incision is benign, Bs+ Extremities:  Without edema. Neurologic:  Alert and  oriented x4;  grossly normal neurologically. Psych:  Alert and cooperative. Normal mood and affect.  Intake/Output from previous day: 11/01 0701 - 11/02 0700 In: 1000 [P.O.:1000] Out: 950 [Urine:950] Intake/Output this shift: Total I/O In: 240 [P.O.:240] Out: 250 [Urine:250]  Lab Results:  [facility] 02/19/12 0455  WBC 17.5*  HGB 9.0*  HCT 27.8*  PLT 436*   BMET  Basename 02/19/12 0455 02/18/12 0522  NA 136 135  K 4.6 4.2  CL 109 105  CO2 19 22  GLUCOSE 148* 140*  BUN 6 7  CREATININE 0.66 0.78  CALCIUM 7.3* 7.5*   LFT  Basename 02/19/12 0455  PROT 5.1*  ALBUMIN 1.4*  AST 8  ALT <5  ALKPHOS 126*  BILITOT 0.3  BILIDIR --  IBILI --    Assessment / Plan: #1 66 yo female with hx of CRohns s/p remote ileocolonic resection, now with colonic perforation at anastamosis, complicated by peritonitis, abdominal wall abscess which has been drained. Last CT 10/29 unfortunately shows several new areas of abscess development around GB, liver, and spleen, and retroperitoneum.   Her antibiotics were stopped-unclear why. Will resume  broad spectrum ABX-Zosyn  Will need follow up CT next week to reassess for drainable collections #2 gastric ulcers - continue BID PPI #3 malnutrition- pt having a very difficult time eating- may need TPN- will check prealbumin, and calorie counts #4 anemia #5 bilateral pleural effusions   Principal Problem:  *Abdominal abscess Active Problems:  Crohn's disease of small intestine with complication  Sepsis  Acute blood loss anemia  Pleural effusion, bilateral  Atelectasis   LOS: 14 days   Amy Esterwood  02/20/2012, 9:48 AM    I have taken an interval history, reviewed the chart and examined the patient. I agree with the extender's note, impression and recommendations.  Restarted antibiotics. Continue PPI. Follow up CT in a few days.  Venita Lick. Russella Dar MD Clementeen Graham

## 2012-02-20 NOTE — Progress Notes (Signed)
Patient ID: Michele David, female   DOB: May 31, 1945, 66 y.o.   MRN: [id number] [facility] Progress Note:   1 Day Post-Op  Subjective: Mental status is clear Objective: Vital signs in last 24 hours: Temp:  [97.8 F (36.6 C)-99.1 F (37.3 C)] 99.1 F (37.3 C) (11/01 2151) Pulse Rate:  [75] 75  (11/01 2151) Resp:  [18-32] 32  (11/01 2151) BP: (147-185)/(59-70) 185/70 mmHg (11/01 2151) SpO2:  [95 %-97 %] 95 % (11/01 2151)  Intake/Output from previous day: 11/01 0701 - 11/02 0700 In: 1000 [P.O.:1000] Out: 950 [Urine:950] Intake/Output this shift: Total I/O In: 240 [P.O.:240] Out: 250 [Urine:250]  Physical Exam: Work of breathing is normal.  Had BM and passing gas.  Lab Results:  Results for orders placed during the hospital encounter of 02/06/12 (from the past 48 hour(s))  CBC     Status: Abnormal   Collection Time   02/19/12  4:55 AM      Component Value Range Comment   WBC 17.5 (*) 4.0 - 10.5 K/uL    RBC 3.36 (*) 3.87 - 5.11 MIL/uL    Hemoglobin 9.0 (*) 12.0 - 15.0 g/dL    HCT 40.9 (*) 81.1 - 46.0 %    MCV 82.7  78.0 - 100.0 fL    MCH 26.8  26.0 - 34.0 pg    MCHC 32.4  30.0 - 36.0 g/dL    RDW 91.4 (*) 78.2 - 15.5 %    Platelets 436 (*) 150 - 400 K/uL   COMPREHENSIVE METABOLIC PANEL     Status: Abnormal   Collection Time   02/19/12  4:55 AM      Component Value Range Comment   Sodium 136  135 - 145 mEq/L    Potassium 4.6  3.5 - 5.1 mEq/L    Chloride 109  96 - 112 mEq/L    CO2 19  19 - 32 mEq/L    Glucose, Bld 148 (*) 70 - 99 mg/dL    BUN 6  6 - 23 mg/dL    Creatinine, Ser 9.56  0.50 - 1.10 mg/dL    Calcium 7.3 (*) 8.4 - 10.5 mg/dL    Total Protein 5.1 (*) 6.0 - 8.3 g/dL    Albumin 1.4 (*) 3.5 - 5.2 g/dL    AST 8  0 - 37 U/L    ALT <5  0 - 35 U/L REPEATED TO VERIFY   Alkaline Phosphatase 126 (*) 39 - 117 U/L    Total Bilirubin 0.3  0.3 - 1.2 mg/dL    GFR calc non Af Amer 89 (*) >90 mL/min    GFR calc Af Amer >90  >90 mL/min      Radiology/Results: No results found.  Anti-infectives: Anti-infectives     Start     Dose/Rate Route Frequency Ordered Stop   02/20/12 1200  piperacillin-tazobactam (ZOSYN) IVPB 3.375 g       3.375 g 12.5 mL/hr over 240 Minutes Intravenous 4 times per day 02/20/12 1010     02/14/12 1000   metroNIDAZOLE (FLAGYL) IVPB 500 mg  Status:  Discontinued        500 mg 100 mL/hr over 60 Minutes Intravenous Every 6 hours 02/14/12 0924 02/16/12 1043   02/08/12 1400   ceFEPIme (MAXIPIME) 1 g in dextrose 5 % 50 mL IVPB  Status:  Discontinued        1 g 100 mL/hr over 30 Minutes Intravenous 3 times per day 02/08/12 1137 02/08/12 1141  02/08/12 1400   piperacillin-tazobactam (ZOSYN) IVPB 3.375 g  Status:  Discontinued        3.375 g 12.5 mL/hr over 240 Minutes Intravenous Every 8 hours 02/08/12 1145 02/18/12 0721   02/08/12 1300   levofloxacin (LEVAQUIN) IVPB 750 mg  Status:  Discontinued        750 mg 100 mL/hr over 90 Minutes Intravenous Every 24 hours 02/08/12 1137 02/08/12 1141   02/06/12 1800   vancomycin (VANCOCIN) IVPB 1000 mg/200 mL premix  Status:  Discontinued        1,000 mg 200 mL/hr over 60 Minutes Intravenous Every 12 hours 02/06/12 1448 02/08/12 1133   02/06/12 1445   ceFEPIme (MAXIPIME) 1 g in dextrose 5 % 50 mL IVPB  Status:  Discontinued        1 g 100 mL/hr over 30 Minutes Intravenous 3 times per day 02/06/12 1431 02/08/12 1137   02/06/12 1445   levofloxacin (LEVAQUIN) IVPB 750 mg  Status:  Discontinued        750 mg 100 mL/hr over 90 Minutes Intravenous Every 24 hours 02/06/12 1431 02/08/12 1137          Assessment/Plan: Problem List: Patient Active Problem List  Diagnosis  . 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 Perforated viscus  . Abdominal abscess  . Sepsis  . Acute blood loss anemia  . Pleural effusion, bilateral  . Atelectasis    Slow progress getting over bowel resection for Crohns' 1 Day Post-Op    LOS: 14 days   Matt  B. Daphine Deutscher, MD, [facility] [phone] beeper [phone]  02/20/2012 11:10 AM

## 2012-02-21 LAB — CBC
HCT: 27.2 % — ABNORMAL LOW (ref 36.0–46.0)
Platelets: 476 10*3/uL — ABNORMAL HIGH (ref 150–400)
RBC: 3.31 MIL/uL — ABNORMAL LOW (ref 3.87–5.11)
RDW: 17.6 % — ABNORMAL HIGH (ref 11.5–15.5)
WBC: 17.5 10*3/uL — ABNORMAL HIGH (ref 4.0–10.5)

## 2012-02-21 NOTE — Progress Notes (Signed)
Patient ID: Michele David, female   DOB: 12/16/1945, 66 y.o.   MRN: [id number]  [facility] Progress Note Covering for Dr. Elnoria Howard  Subjective: Feels about the same,abdominal pain about the same. Trying to eat, no nausea. Having 4-5 loose stools per day. Zosyn resumed yesterday.   Objective:  Vital signs in last 24 hours: Temp:  [98.4 F (36.9 C)-99.6 F (37.6 C)] 98.4 F (36.9 C) (11/02 2200) Pulse Rate:  [77-104] 77  (11/02 2200) Resp:  [16-20] 16  (11/02 2200) BP: (145-149)/(67-74) 149/67 mmHg (11/02 2200) SpO2:  [94 %-98 %] 94 % (11/02 2200) Last BM Date: 02/20/12 General:   Alert, well-developed, in NAD Heart:  Regular rate and rhythm; no murmurs Pulm: clear Abdomen:  Soft, tender diffusely, incision dressed, bs+   Extremities:  Without edema. Neurologic:  Alert and  oriented x4;  grossly normal neurologically. Psych:  Alert and cooperative. Normal mood and affect.  Intake/Output from previous day: 11/02 0701 - 11/03 0700 In: 360 [P.O.:240; I.V.:20; IV Piggyback:100] Out: 1050 [Urine:700] Intake/Output this shift:    Lab Results:  Basename 02/21/12 0355 02/19/12 0455  WBC 17.5* 17.5*  HGB 8.9* 9.0*  HCT 27.2* 27.8*  PLT 476* 436*   BMET  Basename 02/19/12 0455  NA 136  K 4.6  CL 109  CO2 19  GLUCOSE 148*  BUN 6  CREATININE 0.66  CALCIUM 7.3*   LFT  Basename 02/19/12 0455  PROT 5.1*  ALBUMIN 1.4*  AST 8  ALT <5  ALKPHOS 126*  BILITOT 0.3  BILIDIR --  IBILI --    Assessment / Plan: 66 yo female with Crohn's disease s/p recent perforation of prior ileocolonic anastomosis with peritonitis and abscess development -now 3 weeks post op. She had IR drainage of an abdominal wall abscess, but last CT 10/30 shows several areas of fluid collection around liver, spleen and GB which are concerning for new abscess. On Zosyn Plan for repeat CT tomorrow or tues.  #2 anemia- multifactoral #3 bilateral pleural effusions #4 malnutrition-  prealbumin is 7.2, quite low-started calorie counts-she is eating better today #5 gastric ulcers- continue bid PPI  Dr Elnoria Howard to return  tomorrow Principal Problem:  *Abdominal abscess Active Problems:  Crohn's disease of small intestine with complication  Sepsis  Acute blood loss anemia  Pleural effusion, bilateral  Atelectasis    LOS: 15 days   Amy Esterwood  02/21/2012, 8:47 AM   I have taken an interval history, reviewed the chart and examined the patient. I agree with the extender's note, impression and recommendations.   Venita Lick. Russella Dar MD Clement[facility] Graham

## 2012-02-21 NOTE — Progress Notes (Signed)
General Surgery Note  LOS: 15 days  POD -  29 Room - 1504 PCP - Dr. Clydene Laming  GI - Dr. Shela Commons. Mann/Hung (seen by Dr. Russella Dar over weekend)  Assessment/Plan: 1. EXPLORATORY LAPAROTOMY with resection of ileocolonic anastomosis - T.  Gerkin - 01/23/2012   Perforation at anastomosis - all drains are out right now  Open wound looks good   2. Abdominal abscess  On Zosyn  WBC at 17,500 - 02/21/2012 - unchanged  Her perc drain was removed a couple of days ago.  She is for repeat CT scan this week. 3. Crohn's disease of small intestine with perforation.  4. DVT proph -   SQ Heparin 5.  Bilateral pleural effusion 6.  Gastric Ulcers - seen on EGD 02/19/2012 - Dr. Elnoria Howard  On BID PPI 7.  Needs to ambulate more  Subjective:  Had a rough day yesterday - with PICC line insertion.  She did not get out of bed.  But she feels better today.  Though she wanted Frosted Flakes for breakfast and did not get them. Objective:   Filed Vitals:   02/20/12 2200  BP: 149/67  Pulse: 77  Temp: 98.4 F (36.9 C)  Resp: 16     Intake/Output from previous day:  11/02 0701 - 11/03 0700 In: 360 [P.O.:240; I.V.:20; IV Piggyback:100] Out: 1050 [Urine:700]  Intake/Output this shift:      Physical Exam:   General: Older AA F who is alert and oriented.    HEENT: Normal. Pupils equal. .   Lungs: Modest inspiratory effort   Abdomen: Soft.  BS present.   Wound: clean with good granulation tissue.     Lab Results:    Basename 02/21/12 0355 02/19/12 0455  WBC 17.5* 17.5*  HGB 8.9* 9.0*  HCT 27.2* 27.8*  PLT 476* 436*    BMET   Basename 02/19/12 0455  NA 136  K 4.6  CL 109  CO2 19  GLUCOSE 148*  BUN 6  CREATININE 0.66  CALCIUM 7.3*    PT/INR  No results found for this basename: LABPROT:2,INR:2 in the last 72 hours  ABG  No results found for this basename: PHART:2,PCO2:2,PO2:2,HCO3:2 in the last 72 hours   Studies/Results:  Dg Chest Port 1 View  02/20/2012  *RADIOLOGY REPORT*  Clinical Data:  confirm line placement  PORTABLE CHEST - 1 VIEW  Comparison: 02/18/2012  Findings: There is a right arm PICC line with tip in the high right atrium.  Recommend withdrawing by approximately 2 cm for cavoatrial placement.  Heart size is moderately enlarged.  Bilateral lower lobe opacities and effusions are again noted. Right pleural effusion appears partially loculated.  Compared with previous exam the overall aeration the lungs is unchanged.  IMPRESSION:  1.  No significant change and loculated right pleural effusion and smaller left effusion. 2.  The right arm PICC line tip is in the right atrium.  Recommend withdrawing by 2 cm.   Original Report Authenticated By: Signa Kell, M.D.      Anti-infectives:   Anti-infectives     Start     Dose/Rate Route Frequency Ordered Stop   02/20/12 1200  piperacillin-tazobactam (ZOSYN) IVPB 3.375 g       3.375 g 12.5 mL/hr over 240 Minutes Intravenous 4 times per day 02/20/12 1010     02/14/12 1000   metroNIDAZOLE (FLAGYL) IVPB 500 mg  Status:  Discontinued        500 mg 100 mL/hr over 60 Minutes Intravenous Every 6  hours 02/14/12 0924 02/16/12 1043   02/08/12 1400   ceFEPIme (MAXIPIME) 1 g in dextrose 5 % 50 mL IVPB  Status:  Discontinued        1 g 100 mL/hr over 30 Minutes Intravenous 3 times per day 02/08/12 1137 02/08/12 1141   02/08/12 1400   piperacillin-tazobactam (ZOSYN) IVPB 3.375 g  Status:  Discontinued        3.375 g 12.5 mL/hr over 240 Minutes Intravenous Every 8 hours 02/08/12 1145 02/18/12 0721   02/08/12 1300   levofloxacin (LEVAQUIN) IVPB 750 mg  Status:  Discontinued        750 mg 100 mL/hr over 90 Minutes Intravenous Every 24 hours 02/08/12 1137 02/08/12 1141   02/06/12 1800   vancomycin (VANCOCIN) IVPB 1000 mg/200 mL premix  Status:  Discontinued        1,000 mg 200 mL/hr over 60 Minutes Intravenous Every 12 hours 02/06/12 1448 02/08/12 1133   02/06/12 1445   ceFEPIme (MAXIPIME) 1 g in dextrose 5 % 50 mL IVPB  Status:   Discontinued        1 g 100 mL/hr over 30 Minutes Intravenous 3 times per day 02/06/12 1431 02/08/12 1137   02/06/12 1445   levofloxacin (LEVAQUIN) IVPB 750 mg  Status:  Discontinued        750 mg 100 mL/hr over 90 Minutes Intravenous Every 24 hours 02/06/12 1431 02/08/12 1137          Ovidio Kin, MD, FACS Pager: [phone],   [facility] Office: [phone] 02/21/2012

## 2012-02-21 NOTE — Progress Notes (Signed)
Pt encouraged to get OOB today; but refused. Pt complained of not feeling good and tired whenever asked. Preferred to stay in bed today.

## 2012-02-21 NOTE — Progress Notes (Signed)
Brief Nutrition Note/New Calorie Count Consult  Intervention:  1. Will restart 48 hour recall.  2. D/C Ensure pudding. Will add Magic Cups to trays BID 3. Per previous RD note, appetite stimulant may be beneficial if appetite does not improve.    New consult received for calorie count. Previous 48 hour calorie count completed 11/1. See note for results and recommendations. Patient meeting 18% of estimated kcal needs and 9% of estimated protein needs.   Patient reports slight improvement in appetite. Will restart 48 hour calorie count.   She was receiving Ensure pudding TID, but does not like.   Nutrition Dx: Inadequate oral intake, ongoing.   Goal: Pt to consume >75% of meals/supplements, not met.   Linnell Fulling, RD, LDN Pager #: [phone] After-Hours Pager #: [phone]

## 2012-02-22 ENCOUNTER — Inpatient Hospital Stay (HOSPITAL_COMMUNITY): Payer: Medicare Other

## 2012-02-22 ENCOUNTER — Encounter (HOSPITAL_COMMUNITY): Payer: Self-pay | Admitting: Gastroenterology

## 2012-02-22 DIAGNOSIS — M7989 Other specified soft tissue disorders: Secondary | ICD-10-CM

## 2012-02-22 LAB — BASIC METABOLIC PANEL
CO2: 19 mEq/L (ref 19–32)
Chloride: 105 mEq/L (ref 96–112)
GFR calc Af Amer: >90 mL/min (ref >90)
Potassium: 3.7 mEq/L (ref 3.5–5.1)

## 2012-02-22 LAB — CBC
HCT: 28 % — ABNORMAL LOW (ref 36.0–46.0)
Hemoglobin: 9.3 g/dL — ABNORMAL LOW (ref 12.0–15.0)
WBC: 16.8 10*3/uL — ABNORMAL HIGH (ref 4.0–10.5)

## 2012-02-22 MED ORDER — IOHEXOL 300 MG/ML  SOLN
100.0000 mL | Freq: Once | INTRAMUSCULAR | Status: AC | PRN
  Administered 2012-02-22: 100 mL via INTRAVENOUS

## 2012-02-22 NOTE — Progress Notes (Signed)
*  PRELIMINARY RESULTS* Vascular Ultrasound Right upper extremity venous duplex has been completed.  Preliminary findings: No evidence of DVT or superficial thrombosis.    Farrel Demark, RDMS, RVT 02/22/2012, 9:48 AM

## 2012-02-22 NOTE — Progress Notes (Signed)
Subjective: Patient is waiting on a CT scan to be done today. She claims she is very uncomfortable today. Has had several loose stools today. Continues to have diffuse abdominal pain with nausea.   Objective: Vital signs in last 24 hours: Temp:  [97.5 F (36.4 C)-98.5 F (36.9 C)] 98 F (36.7 C) (11/04 1336) Pulse Rate:  [64-79] 72  (11/04 1336) Resp:  [18-20] 20  (11/04 1336) BP: (140-171)/(69-75) 171/69 mmHg (11/04 1336) SpO2:  [94 %-98 %] 98 % (11/04 1336) Last BM Date: 02/21/12  Intake/Output from previous day: 11/03 0701 - 11/04 0700 In: 1266.5 [I.V.:1116.5; IV Piggyback:150] Out: -  Intake/Output this shift: Total I/O In: 600 [I.V.:600] Out: 100 [Urine:100]  General appearance: alert, cooperative, appears stated age, fatigued and mild distress Resp: clear to auscultation bilaterally Cardio: regular rate and rhythm, S1, S2 normal, no murmur, click, rub or gallop GI: soft, diffusely tender with hyperactive bowel sounds normal; no masses,  no organomegaly Extremities: bilateral edema  Lab Results:  Basename 02/22/12 1010 02/21/12 0355  WBC 16.8* 17.5*  HGB 9.3* 8.9*  HCT 28.0* 27.2*  PLT 431* 476*   BMET  Basename 02/22/12 1010  NA 135  K 3.7  CL 105  CO2 19  GLUCOSE 183*  BUN 10  CREATININE 0.73  CALCIUM 7.8*   Studies/Results: No results found.  Medications: I have reviewed the patient's current medications.  Assessment/Plan: 1) Crohn's disease s/p ileocolonic resection for perforation, LOA and mid-ileum to transverse colon anastamosis complicated by an abdominal wall abscess . Awaiting repeat CT scan results. On Zoysn.  2) Anemia secondary to 1. 3) Non-bleeding gastric ulcers: on Protonix. 4) Severe malnutrition.  5) Bilateral pleural effusions.  LOS: 16 days   Michele David 02/22/2012, 4:48 PM

## 2012-02-22 NOTE — Progress Notes (Signed)
Ambulated in hallway 170ft Barnett Hatter P

## 2012-02-22 NOTE — Progress Notes (Signed)
02-22-12  1450  IV Team Note;    Pt had single lumen picc placed on 02-20-12;   Pt has been seen twice today by myself (IV Team);   No swelling noted; pt not c/o pain in her right arm, but rather in her right side;    Pt had ultrasound done this am;  Iv fluids are infusing at present;  Labs were drawn this am without difficulty;    Please see progress note from Stacie Glaze on 02-20-12 regarding picc placement and Dr Bradly Chris in Radiology dept;   Spoke with Merleen Milliner RN at [facility] this am regarding cxr rept from 02-20-12;  picc line has not been pulled back as per Dr Keturah Barre note to leave picc where it is after he spoke with Stacie Glaze RN.     Barkley Bruns RN IV Team

## 2012-02-22 NOTE — Progress Notes (Signed)
PT Cancellation Note  Patient Details Name: Michele David MRN: [id number] DOB: 11-11-1945   Cancelled Treatment:     Pt prepping for CT scan. Requested PT check back later this afternoon. Will check back as schedule permits. Thanks.    [name] [facility] 02/22/2012, 11:00 AM [phone]

## 2012-02-22 NOTE — Progress Notes (Signed)
PT Cancellation Note  Patient Details Name: Michele David MRN: [id number] DOB: Oct 06, 1945   Cancelled Treatment:     2nd attempt for tx session. Pt still awaiting procedure. Requested PT check back on tomorrow.    [name] [facility] 02/22/2012, 2:13 PM [phone]

## 2012-02-22 NOTE — Progress Notes (Signed)
General Surgery [facility]  CT scan just completed.  Will review results tonight.  Velora Heckler, MD, [facility] Office: [phone]

## 2012-02-22 NOTE — Progress Notes (Signed)
3 Days Post-Op  Subjective: Complaining of right sided chest discomfort w/o radiation or change with deep inspiration, states it has been there since PICC line placement. Describes it as a "soreness" that does not change. Patient does have hx of right plueral effusion as well ? Cause of pain. Still not eating much, c/o of rapid satiety and bloating afterwards.  Objective: Vital signs in last 24 hours: Temp:  [97.5 F (36.4 C)-98.5 F (36.9 C)] 97.5 F (36.4 C) (11/04 0559) Pulse Rate:  [64-96] 64  (11/04 0559) Resp:  [16-18] 18  (11/04 0559) BP: (140-147)/(72-79) 140/72 mmHg (11/04 0559) SpO2:  [94 %-97 %] 94 % (11/04 0559) Last BM Date: 02/21/12  Intake/Output from previous day: 11/03 0701 - 11/04 0700 In: 1266.5 [I.V.:1116.5; IV Piggyback:150] Out: -  Intake/Output this shift: Total I/O In: -  Out: 100 [Urine:100]  General appearance: alert, cooperative, appears stated age, fatigued and no distress Chest: C/o right side "soreness" since PICC line placement, denies any radiation, diaphoresis, nausea or change in character of complaint. BS clear. Cardiac: RRR No M/R/G on ascultation. Abdomen: open abdominal wound with wet to dry dressing; wound appears beefy red w/o exudate. Extremities: + pulses bilaterally, no swelling or tenderness. Labs: pending  VSS, afebrile.  Lab Results:   [facility] 02/21/12 0355  WBC 17.5*  HGB 8.9*  HCT 27.2*  PLT 476*   BMET No results found for this basename: NA:2,K:2,CL:2,CO2:2,GLUCOSE:2,BUN:2,CREATININE:2,CALCIUM:2 in the last 72 hours PT/INR No results found for this basename: LABPROT:2,INR:2 in the last 72 hours ABG No results found for this basename: PHART:2,PCO2:2,PO2:2,HCO3:2 in the last 72 hours  Studies/Results: Dg Chest Port 1 View  02/20/2012  *RADIOLOGY REPORT*  Clinical Data: confirm line placement  PORTABLE CHEST - 1 VIEW  Comparison: 02/18/2012  Findings: There is a right arm PICC line with tip in the high right atrium.   Recommend withdrawing by approximately 2 cm for cavoatrial placement.  Heart size is moderately enlarged.  Bilateral lower lobe opacities and effusions are again noted. Right pleural effusion appears partially loculated.  Compared with previous exam the overall aeration the lungs is unchanged.  IMPRESSION:  1.  No significant change and loculated right pleural effusion and smaller left effusion. 2.  The right arm PICC line tip is in the right atrium.  Recommend withdrawing by 2 cm.   Original Report Authenticated By: Signa Kell, M.D.     Anti-infectives: Anti-infectives     Start     Dose/Rate Route Frequency Ordered Stop   02/20/12 1200  piperacillin-tazobactam (ZOSYN) IVPB 3.375 g       3.375 g 12.5 mL/hr over 240 Minutes Intravenous 4 times per day 02/20/12 1010     02/14/12 1000   metroNIDAZOLE (FLAGYL) IVPB 500 mg  Status:  Discontinued        500 mg 100 mL/hr over 60 Minutes Intravenous Every 6 hours 02/14/12 0924 02/16/12 1043   02/08/12 1400   ceFEPIme (MAXIPIME) 1 g in dextrose 5 % 50 mL IVPB  Status:  Discontinued        1 g 100 mL/hr over 30 Minutes Intravenous 3 times per day 02/08/12 1137 02/08/12 1141   02/08/12 1400   piperacillin-tazobactam (ZOSYN) IVPB 3.375 g  Status:  Discontinued        3.375 g 12.5 mL/hr over 240 Minutes Intravenous Every 8 hours 02/08/12 1145 02/18/12 0721   02/08/12 1300   levofloxacin (LEVAQUIN) IVPB 750 mg  Status:  Discontinued  750 mg 100 mL/hr over 90 Minutes Intravenous Every 24 hours 02/08/12 1137 02/08/12 1141   02/06/12 1800   vancomycin (VANCOCIN) IVPB 1000 mg/200 mL premix  Status:  Discontinued        1,000 mg 200 mL/hr over 60 Minutes Intravenous Every 12 hours 02/06/12 1448 02/08/12 1133   02/06/12 1445   ceFEPIme (MAXIPIME) 1 g in dextrose 5 % 50 mL IVPB  Status:  Discontinued        1 g 100 mL/hr over 30 Minutes Intravenous 3 times per day 02/06/12 1431 02/08/12 1137   02/06/12 1445   levofloxacin (LEVAQUIN) IVPB 750  mg  Status:  Discontinued        750 mg 100 mL/hr over 90 Minutes Intravenous Every 24 hours 02/06/12 1431 02/08/12 1137          Assessment/Plan:  Patient Active Problem List  Diagnosis  . 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 Perforated viscus  . Abdominal abscess  . Sepsis  . Acute blood loss anemia  . Pleural effusion, bilateral  . Atelectasis  s/p Procedure(s) (LRB) with comments: ESOPHAGOGASTRODUODENOSCOPY (EGD) (N/A) 1. Recheck labs this am 2. Repeat CT scan of abdomen and pelvis as per previous plan. 3. Continue wound care 4. Continue calorie count (nutrition's notes and input are appreciated) 5. Consult PICC team concerning PICC line c/o. (Xray indicates need for pull back of 2 cm secondary to being in "right atrium). 6. [place] of LUE to r/o thrombus vs associated pain from right plueral effusion. 6. Ambulate/OOB    LOS: 16 days    Blenda Mounts [facility] Pager # [phone] 02/22/2012

## 2012-02-22 NOTE — Progress Notes (Signed)
Calorie Count and Follow Up Note  48 hour calorie count ordered. Pt reports she has been working hard at eating better. Pt did well yesterday consuming half of a [employer] barbeque sandwich and milkshake.   Diet: Regular  Supplements: Magic Cup BID  11/3 Breakfast: No intake recorded  Lunch: 735 calories, 24g protein Dinner: 288 calories, 3g protein  11/4  Breakfast: 300 calories, 7g protein  Total 11/3 intake: 1023 calories kcal (58% of minimum estimated needs)  27g protein (32% of minimum estimated needs)   Meds: Scheduled Meds:   . acetaminophen  650 mg Rectal Once  . antiseptic oral rinse  15 mL Mouth Rinse BID  . furosemide  20 mg Oral Daily  . heparin  5,000 Units Subcutaneous Q8H  . ondansetron  4 mg Intravenous Q6H  . pantoprazole  40 mg Oral BID  . piperacillin-tazobactam (ZOSYN)  IV  3.375 g Intravenous Q6H  . potassium chloride  30 mEq Oral TID PC  . sucralfate  1 g Oral TID WC & HS  . [DISCONTINUED] feeding supplement  1 Container Oral TID BM   Continuous Infusions:   . dextrose 5 % and 0.9 % NaCl with KCl 20 mEq/L 75 mL/hr at 02/22/12 0543   PRN Meds:.acetaminophen, fentaNYL, oxyCODONE-acetaminophen, sodium chloride   CMP     Component Value Date/Time   NA 135 02/22/2012 1010   K 3.7 02/22/2012 1010   CL 105 02/22/2012 1010   CO2 19 02/22/2012 1010   GLUCOSE 183* 02/22/2012 1010   BUN 10 02/22/2012 1010   CREATININE 0.73 02/22/2012 1010   CALCIUM 7.8* 02/22/2012 1010   PROT 5.1* 02/19/2012 0455   ALBUMIN 1.4* 02/19/2012 0455   AST 8 02/19/2012 0455   ALT <5 02/19/2012 0455   ALKPHOS 126* 02/19/2012 0455   BILITOT 0.3 02/19/2012 0455   GFRNONAA 86* 02/22/2012 1010   GFRAA >90 02/22/2012 1010    CBG (last 3)  No results found for this basename: GLUCAP:3 in the last 72 hours   Intake/Output Summary (Last 24 hours) at 02/22/12 1432 Last data filed at 02/22/12 0747  Gross per 24 hour  Intake 1216.53 ml  Output    100 ml  Net 1116.53 ml   Last BM -  11/3  Weight Status:  No new weights  Estimated needs:   1750-1950 calories 85-105g protein  Nutrition Dx: Inadequate oral intake - improving  Goal: Pt to consume >75% of meals/supplements - not met but progressing.  Monitor:  Weights, labs, intake   Levon Hedger MS, RD, LDN [phone] Pager [phone] After Hours Pager

## 2012-02-23 ENCOUNTER — Inpatient Hospital Stay (HOSPITAL_COMMUNITY): Payer: Medicare Other

## 2012-02-23 ENCOUNTER — Encounter (HOSPITAL_COMMUNITY): Payer: Self-pay | Admitting: Radiology

## 2012-02-23 MED ORDER — PIPERACILLIN-TAZOBACTAM 3.375 G IVPB
3.3750 g | Freq: Three times a day (TID) | INTRAVENOUS | Status: DC
  Administered 2012-02-24 – 2012-02-27 (×11): 3.375 g via INTRAVENOUS
  Filled 2012-02-23 (×12): qty 50

## 2012-02-23 MED ORDER — MIDAZOLAM HCL 2 MG/2ML IJ SOLN
INTRAMUSCULAR | Status: AC | PRN
  Administered 2012-02-23: 1 mg via INTRAVENOUS

## 2012-02-23 MED ORDER — FENTANYL CITRATE 0.05 MG/ML IJ SOLN
INTRAMUSCULAR | Status: AC | PRN
  Administered 2012-02-23: 50 ug via INTRAVENOUS

## 2012-02-23 NOTE — Consult Note (Signed)
WOC consult Note Reason for Consult: Consult requested by CCS team for vac application to abd wound. Wound type: Chronic full thickness wound Measurement:19X4X3.5cm Wound bed:95% beefy red, 5% yellow Drainage (amount, consistency, odor) Mod yellow drainage, no odor. Periwound: Intact skin surrounding. Dressing procedure/placement/frequency: Discussed benefits and routines of vac therapy with patient and daughter at bedside.  Supplies ordered to bedside.  Pt denies c/o pain when dressing changed and declines offer of pain medicine.  Cammie Mcgee, RN, MSN, [employer]  [phone]

## 2012-02-23 NOTE — Progress Notes (Signed)
Subjective: Pt knwn to [place] for prior ant abd drain to abscess collection, removed on 11/1. Now having cont pain and elevated WBC Repeat CT last pm shows dominant retroperitoneal fluid collection with a few smaller collections that likely connect to this, which is in the area of her enterocolonic anastomosis. Surgery team has requested a new perc drain be placed to dominant collection.  Objective: Physical Exam: BP 147/76  Pulse 75  Temp 98 F (36.7 C) (Oral)  Resp 20  Ht 5\' 2"  (1.575 m)  Wt 158 lb 8.2 oz (71.9 kg)  BMI 28.99 kg/m2  SpO2 98% Lungs: CTA without w/r/r Heart: Regular Abdomen: soft, midline wound clean, mildly tender right abd and CVAT    Labs: CBC  Basename 02/22/12 1010 02/21/12 0355  WBC 16.8* 17.5*  HGB 9.3* 8.9*  HCT 28.0* 27.2*  PLT 431* 476*   BMET  Basename 02/22/12 1010  NA 135  K 3.7  CL 105  CO2 19  GLUCOSE 183*  BUN 10  CREATININE 0.73  CALCIUM 7.8*   LFT No results found for this basename: PROT,ALBUMIN,AST,ALT,ALKPHOS,BILITOT,BILIDIR,IBILI,LIPASE in the last 72 hours PT/INR No results found for this basename: LABPROT:2,INR:2 in the last 72 hours   Studies/Results: Ct Abdomen Pelvis W Contrast  02/22/2012  *RADIOLOGY REPORT*  Clinical Data: Patient is status post ileocolonic resection for perforation.  Patient history of (Crohn's disease) .  CT ABDOMEN AND PELVIS WITH CONTRAST  Technique:  Multidetector CT imaging of the abdomen and pelvis was performed following the standard protocol during bolus administration of intravenous contrast.  Contrast: OMNIPAQUE IOHEXOL 300 MG/ML  SOLN  Comparison: CT 10/30 1013, 02/11/2012, 01/23/2012  Findings: The dominant finding is new abscess in the right abdomen involving the retroperitoneal space inferior to the right kidney and lateral to the psoas muscle as well as extending into the peritoneal space anterior to the right kidney and more centrally inferior to the third portion the duodenum.  These  new abscess are in the vicinity of the enteric colonic  anastomosis in the right abdomen (image 40).  The retroperitoneal portion of the abscess measures 7 cm x 5 cm (image 44).  The portion anterior right kidney measures 4.5 x 2.6 cm.  The portion inferior to the duodenum measures 5.2 x 3.4 cm. All three collections appear to communicate centrally.  On the most recent CT scan and there was a fluid collection described in the retroperitoneum at this location.  There is again demonstrated a rim of subcapsular low attenuation surrounding the liver consistent with abscess.  This is not improved compared to prior.  Similar findings surrounding the spleen also unchanged.  There is a moderate loculated right pleural effusion with passive atelectasis unchanged.   Smaller effusion on the left.  No pericardial fluid.  No biliary ductal dilatation.  The stomach appears normal.  The bowel is grossly normal.  The distal colon is normal.  The pancreas appears normal.  Abdominal aorta is normal.  In the pelvis, the bladder uterus are normal.  The ovaries are normal.  There is a midline ventral wound without evidence residual abscess.  IMPRESSION:  1.  Dominant finding is a new abscess within the right abdomen involving the retroperitoneum as well as the intraperitoneal spaces which appears to be involved with the enteric colonic anastomoses. Concern for leak at the anastomoses and abscess formation. 2. Continued concern for subcapsular abscesses  involving the liver and spleen which are unchanged from prior.  3.  Stable bilateral pleural  effusions  and associated atelectasis.  Findings discussed with Dr. Dwain Sarna on 02/22/2012 at 7:20 pm hours.   Original Report Authenticated By: Genevive Bi, M.D.     Assessment/Plan: New rt retroperitoneal abscess Reviewed with Dr. Bonnielee Haff, feels we could safely place drain to retroperitoneal collection. Discussed procedure with pt including risks. Labs reviewed. Heparin on hold, pt  has been NPO this am Consent signed in chart.    LOS: 17 days    Brayton El PA-C 02/23/2012 9:18 AM

## 2012-02-23 NOTE — Procedures (Signed)
R retroperitoneal abscess drain Frank pus No comp

## 2012-02-23 NOTE — Progress Notes (Signed)
General Surgery [facility]  Patient seen and examined.  Wants to eat.  Underwent percutaneous drainage today with purulent output.  Will allow resumption of diet.  Velora Heckler, MD, [facility] Office: [phone]

## 2012-02-23 NOTE — Progress Notes (Signed)
PT Cancellation Note  Patient Details Name: Michele David MRN: [id number] DOB: [birth year]   Cancelled Treatment:     PT tx cancelled today. Pt having procedure-drain placed. Will check back on tomorrow.    [name] [facility] 02/23/2012, 11:11 AM [id number]

## 2012-02-23 NOTE — Progress Notes (Signed)
Patient ID: Michele David, female   DOB: 1945/04/30, 66 y.o.   MRN: [id number] 4 Days Post-Op  Subjective: Still has some right sided chest discomfort. Otherwise no new c/o.  Objective: Vital signs in last 24 hours: Temp:  [98 F (36.7 C)-98.2 F (36.8 C)] 98 F (36.7 C) (11/05 0500) Pulse Rate:  [72-91] 75  (11/05 0500) Resp:  [18-20] 20  (11/05 0500) BP: (133-171)/(68-76) 147/76 mmHg (11/05 0500) SpO2:  [98 %] 98 % (11/05 0500) Last BM Date: 02/22/12  Intake/Output from previous day: 11/04 0701 - 11/05 0700 In: 600 [I.V.:600] Out: 250 [Urine:250] Intake/Output this shift:    General appearance: alert, cooperative, appears stated age, fatigued and no distress Chest: C/o right side "soreness" since PICC line placement, denies any radiation, diaphoresis, nausea or change in character of complaint. BS clear. Cardiac: RRR No M/R/G on ascultation. Abdomen: open abdominal wound with wet to dry dressing; wound appears beefy red w/o exudate. +BS CT of abdomen pelvis done 02/22/12: IMPRESSION:  1. Dominant finding is a new abscess within the right abdomen  involving the retroperitoneum as well as the intraperitoneal spaces  which appears to be involved with the enteric colonic anastomoses.  Concern for leak at the anastomoses and abscess formation.  2. Continued concern for subcapsular abscesses involving the liver  and spleen which are unchanged from prior.  3. Stable bilateral pleural effusions and associated atelectasis.  Findings discussed with Dr. Dwain Sarna on 02/22/2012 at 7:20 pm  hours. Extremities: + pulses bilaterally, no swelling or tenderness. Labs: pending  VSS, afebrile.  Lab Results:   Basename 02/22/12 1010 02/21/12 0355  WBC 16.8* 17.5*  HGB 9.3* 8.9*  HCT 28.0* 27.2*  PLT 431* 476*   BMET  Basename 02/22/12 1010  NA 135  K 3.7  CL 105  CO2 19  GLUCOSE 183*  BUN 10  CREATININE 0.73  CALCIUM 7.8*   PT/INR No results found for this basename:  LABPROT:2,INR:2 in the last 72 hours ABG No results found for this basename: PHART:2,PCO2:2,PO2:2,HCO3:2 in the last 72 hours  Studies/Results: Ct Abdomen Pelvis W Contrast  02/22/2012  *RADIOLOGY REPORT*  Clinical Data: Patient is status post ileocolonic resection for perforation.  Patient history of (Crohn's disease) .  CT ABDOMEN AND PELVIS WITH CONTRAST  Technique:  Multidetector CT imaging of the abdomen and pelvis was performed following the standard protocol during bolus administration of intravenous contrast.  Contrast: OMNIPAQUE IOHEXOL 300 MG/ML  SOLN  Comparison: CT 10/30 1013, 02/11/2012, 01/23/2012  Findings: The dominant finding is new abscess in the right abdomen involving the retroperitoneal space inferior to the right kidney and lateral to the psoas muscle as well as extending into the peritoneal space anterior to the right kidney and more centrally inferior to the third portion the duodenum.  These new abscess are in the vicinity of the enteric colonic  anastomosis in the right abdomen (image 40).  The retroperitoneal portion of the abscess measures 7 cm x 5 cm (image 44).  The portion anterior right kidney measures 4.5 x 2.6 cm.  The portion inferior to the duodenum measures 5.2 x 3.4 cm. All three collections appear to communicate centrally.  On the most recent CT scan and there was a fluid collection described in the retroperitoneum at this location.  There is again demonstrated a rim of subcapsular low attenuation surrounding the liver consistent with abscess.  This is not improved compared to prior.  Similar findings surrounding the spleen also unchanged.  There  is a moderate loculated right pleural effusion with passive atelectasis unchanged.   Smaller effusion on the left.  No pericardial fluid.  No biliary ductal dilatation.  The stomach appears normal.  The bowel is grossly normal.  The distal colon is normal.  The pancreas appears normal.  Abdominal aorta is normal.  In the  pelvis, the bladder uterus are normal.  The ovaries are normal.  There is a midline ventral wound without evidence residual abscess.  IMPRESSION:  1.  Dominant finding is a new abscess within the right abdomen involving the retroperitoneum as well as the intraperitoneal spaces which appears to be involved with the enteric colonic anastomoses. Concern for leak at the anastomoses and abscess formation. 2. Continued concern for subcapsular abscesses  involving the liver and spleen which are unchanged from prior.  3.  Stable bilateral pleural effusions  and associated atelectasis.  Findings discussed with Dr. Dwain Sarna on 02/22/2012 at 7:20 pm hours.   Original Report Authenticated By: Genevive Bi, M.D.     Anti-infectives: Anti-infectives     Start     Dose/Rate Route Frequency Ordered Stop   02/20/12 1200   piperacillin-tazobactam (ZOSYN) IVPB 3.375 g        3.375 g 12.5 mL/hr over 240 Minutes Intravenous 4 times per day 02/20/12 1010     02/14/12 1000   metroNIDAZOLE (FLAGYL) IVPB 500 mg  Status:  Discontinued        500 mg 100 mL/hr over 60 Minutes Intravenous Every 6 hours 02/14/12 0924 02/16/12 1043   02/08/12 1400   ceFEPIme (MAXIPIME) 1 g in dextrose 5 % 50 mL IVPB  Status:  Discontinued        1 g 100 mL/hr over 30 Minutes Intravenous 3 times per day 02/08/12 1137 02/08/12 1141   02/08/12 1400   piperacillin-tazobactam (ZOSYN) IVPB 3.375 g  Status:  Discontinued        3.375 g 12.5 mL/hr over 240 Minutes Intravenous Every 8 hours 02/08/12 1145 02/18/12 0721   02/08/12 1300   levofloxacin (LEVAQUIN) IVPB 750 mg  Status:  Discontinued        750 mg 100 mL/hr over 90 Minutes Intravenous Every 24 hours 02/08/12 1137 02/08/12 1141   02/06/12 1800   vancomycin (VANCOCIN) IVPB 1000 mg/200 mL premix  Status:  Discontinued        1,000 mg 200 mL/hr over 60 Minutes Intravenous Every 12 hours 02/06/12 1448 02/08/12 1133   02/06/12 1445   ceFEPIme (MAXIPIME) 1 g in dextrose 5 % 50 mL IVPB   Status:  Discontinued        1 g 100 mL/hr over 30 Minutes Intravenous 3 times per day 02/06/12 1431 02/08/12 1137   02/06/12 1445   levofloxacin (LEVAQUIN) IVPB 750 mg  Status:  Discontinued        750 mg 100 mL/hr over 90 Minutes Intravenous Every 24 hours 02/06/12 1431 02/08/12 1137          Assessment/Plan:  Patient Active Problem List  Diagnosis  . 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 Perforated viscus  . Abdominal abscess  . Sepsis  . Acute blood loss anemia  . Pleural effusion, bilateral  . Atelectasis  s/p Procedure(s) (LRB) with comments: ESOPHAGOGASTRODUODENOSCOPY (EGD) (N/A) 1. Will ask IR to place perc drain for abscess formation seen on recent CT scan. 2. NPO for IR procedure 3. Hold Heparin in anticipation of perc drain placement. 4. Continue wound care, will ask wound care  to see and eval for wound vac. 5. Continue calorie count (nutrition's notes and input are appreciated) 6. Ambulate/OOB    LOS: 17 days    Blenda Mounts [facility] Surgery Pager # [phone] 02/23/2012

## 2012-02-23 NOTE — Consult Note (Signed)
Wound care follow-up:  Vac applied to abd wound.  Mepitel and one piece of white sponge with black sponge over layer to cont suction.  Pt tolerated with minimal discomfort.  Inner wound bed has deeper patchy areas and retention sutures. WOC will plan to change Friday. Pt will need home vac approval paperwork started by [facility].  Cammie Mcgee, RN, MSN, [employer]  [phone]

## 2012-02-23 NOTE — Progress Notes (Signed)
Subjective: No acute changes.  Pain in her right abdomen.  Objective: Vital signs in last 24 hours: Temp:  [98 F (36.7 C)-98.2 F (36.8 C)] 98 F (36.7 C) (11/05 0500) Pulse Rate:  [72-91] 75  (11/05 0500) Resp:  [18-20] 20  (11/05 0500) BP: (133-171)/(68-76) 147/76 mmHg (11/05 0500) SpO2:  [98 %] 98 % (11/05 0500) Last BM Date: 02/22/12  Intake/Output from previous day: 11/04 0701 - 11/05 0700 In: 600 [I.V.:600] Out: 250 [Urine:250] Intake/Output this shift:    General appearance: alert and mild distress GI: tender on the right side  Lab Results:  Basename 02/22/12 1010 02/21/12 0355  WBC 16.8* 17.5*  HGB 9.3* 8.9*  HCT 28.0* 27.2*  PLT 431* 476*   BMET  Basename 02/22/12 1010  NA 135  K 3.7  CL 105  CO2 19  GLUCOSE 183*  BUN 10  CREATININE 0.73  CALCIUM 7.8*   LFT No results found for this basename: PROT,ALBUMIN,AST,ALT,ALKPHOS,BILITOT,BILIDIR,IBILI in the last 72 hours PT/INR No results found for this basename: LABPROT:2,INR:2 in the last 72 hours Hepatitis Panel No results found for this basename: HEPBSAG,HCVAB,HEPAIGM,HEPBIGM in the last 72 hours C-Diff No results found for this basename: CDIFFTOX:3 in the last 72 hours Fecal Lactopherrin No results found for this basename: FECLLACTOFRN in the last 72 hours  Studies/Results: Ct Abdomen Pelvis W Contrast  02/22/2012  *RADIOLOGY REPORT*  Clinical Data: Patient is status post ileocolonic resection for perforation.  Patient history of (Crohn's disease) .  CT ABDOMEN AND PELVIS WITH CONTRAST  Technique:  Multidetector CT imaging of the abdomen and pelvis was performed following the standard protocol during bolus administration of intravenous contrast.  Contrast: OMNIPAQUE IOHEXOL 300 MG/ML  SOLN  Comparison: CT 10/30 1013, 02/11/2012, 01/23/2012  Findings: The dominant finding is new abscess in the right abdomen involving the retroperitoneal space inferior to the right kidney and lateral to the psoas  muscle as well as extending into the peritoneal space anterior to the right kidney and more centrally inferior to the third portion the duodenum.  These new abscess are in the vicinity of the enteric colonic  anastomosis in the right abdomen (image 40).  The retroperitoneal portion of the abscess measures 7 cm x 5 cm (image 44).  The portion anterior right kidney measures 4.5 x 2.6 cm.  The portion inferior to the duodenum measures 5.2 x 3.4 cm. All three collections appear to communicate centrally.  On the most recent CT scan and there was a fluid collection described in the retroperitoneum at this location.  There is again demonstrated a rim of subcapsular low attenuation surrounding the liver consistent with abscess.  This is not improved compared to prior.  Similar findings surrounding the spleen also unchanged.  There is a moderate loculated right pleural effusion with passive atelectasis unchanged.   Smaller effusion on the left.  No pericardial fluid.  No biliary ductal dilatation.  The stomach appears normal.  The bowel is grossly normal.  The distal colon is normal.  The pancreas appears normal.  Abdominal aorta is normal.  In the pelvis, the bladder uterus are normal.  The ovaries are normal.  There is a midline ventral wound without evidence residual abscess.  IMPRESSION:  1.  Dominant finding is a new abscess within the right abdomen involving the retroperitoneum as well as the intraperitoneal spaces which appears to be involved with the enteric colonic anastomoses. Concern for leak at the anastomoses and abscess formation. 2. Continued concern for subcapsular abscesses  involving the liver and spleen which are unchanged from prior.  3.  Stable bilateral pleural effusions  and associated atelectasis.  Findings discussed with Dr. Dwain Sarna on 02/22/2012 at 7:20 pm hours.   Original Report Authenticated By: Genevive Bi, M.D.     Medications:  Scheduled:   . acetaminophen  650 mg Rectal Once  .  antiseptic oral rinse  15 mL Mouth Rinse BID  . furosemide  20 mg Oral Daily  . heparin  5,000 Units Subcutaneous Q8H  . ondansetron  4 mg Intravenous Q6H  . pantoprazole  40 mg Oral BID  . piperacillin-tazobactam (ZOSYN)  IV  3.375 g Intravenous Q6H  . potassium chloride  30 mEq Oral TID PC  . sucralfate  1 g Oral TID WC & HS   Continuous:   . dextrose 5 % and 0.9 % NaCl with KCl 20 mEq/L 75 mL/hr at 02/22/12 2357    Assessment/Plan: 1) New right retroperitoneal abscess around the anastamosis. 2) Crohn's disease - Most likely in surgical remission.   The finding of the abscess confirms the patient's complaints of right sided/lower abdominal pain.  Overall she is stable and continues to receive Zosyn.  Plan: 1) ? IR drainage.  Surgery to decide on the best approach. 2) Will follow.  LOS: 17 days   Kataya Guimont D 02/23/2012, 7:49 AM

## 2012-02-23 NOTE — Progress Notes (Signed)
Calorie Count Note  48 hour calorie count ordered. Pt able to tolerate hamburger and fries for lunch today.   Intervention: Encouraged continued increased meal intake. Will continue to monitor and analyze calorie count.   Diet: Regular  Supplements: Magic Cup BID  11/5 Breakfast: Did not eat, was downstairs getting abscess drain Lunch: 630 calories, 16g protein (food brought in) Dinner: Has not eaten yet  Total intake: 630 kcal (36% of minimum estimated needs)  16g protein (19% of minimum estimated needs)  Nutrition Dx: Inadequate oral intake - improving  Goal: Pt to consume >75% of meals/supplements - met with lunch   Levon Hedger MS, RD, LDN [phone] Pager [phone] After Hours Pager

## 2012-02-24 LAB — CBC
MCH: 26.7 pg (ref 26.0–34.0)
MCHC: 32.8 g/dL (ref 30.0–36.0)
Platelets: 423 10*3/uL — ABNORMAL HIGH (ref 150–400)
RDW: 17.2 % — ABNORMAL HIGH (ref 11.5–15.5)

## 2012-02-24 MED ORDER — LOSARTAN POTASSIUM 50 MG PO TABS
50.0000 mg | ORAL_TABLET | Freq: Every day | ORAL | Status: DC
  Administered 2012-02-24 – 2012-03-07 (×13): 50 mg via ORAL
  Filled 2012-02-24 (×13): qty 1

## 2012-02-24 NOTE — Progress Notes (Signed)
Dr. Maisie Fus aware of 3 beat runs of vtach today.  No further intervention at this time.  Will continue to monitor.

## 2012-02-24 NOTE — Progress Notes (Signed)
Patient with 3 beat run of vtach, twice today.  Patient asymptomatic.  Placed call to [facility].  Awaiting call back.  Will monitor.

## 2012-02-24 NOTE — Progress Notes (Signed)
General Surgery [facility]  Percutaneous drainage of abscess successful.  Will need repeat CT scan prior to removing drain catheter.  Will request injection of drain catheter to rule out fistula formation prior to removal.  Continue IV abx.  Velora Heckler, MD, [facility] Office: [phone]

## 2012-02-24 NOTE — Progress Notes (Signed)
5 Days Post-Op  Subjective: Pt feeling a little better today; has less abd pain; no nausea/vomiting; occ cough  Objective: Vital signs in last 24 hours: Temp:  [97.5 F (36.4 C)-98.6 F (37 C)] 97.7 F (36.5 C) (11/06 0630) Pulse Rate:  [71-88] 78  (11/06 0630) Resp:  [16-49] 16  (11/06 0630) BP: (141-168)/(62-77) 153/72 mmHg (11/06 0940) SpO2:  [94 %-100 %] 98 % (11/06 0630) Last BM Date: 02/23/12  Intake/Output from previous day: 11/05 0701 - 11/06 0700 In: 2437.5 [I.V.:2437.5] Out: 475 [Urine:475] Intake/Output this shift:    Rt RP drain intact, about 15-20 cc's granular beige fluid(likely bowel contents) in bag, cx's pend  Lab Results:   Basename 02/24/12 0750 02/22/12 1010  WBC 13.7* 16.8*  HGB 9.0* 9.3*  HCT 27.4* 28.0*  PLT 423* 431*   BMET  Basename 02/22/12 1010  NA 135  K 3.7  CL 105  CO2 19  GLUCOSE 183*  BUN 10  CREATININE 0.73  CALCIUM 7.8*   PT/INR No results found for this basename: LABPROT:2,INR:2 in the last 72 hours ABG No results found for this basename: PHART:2,PCO2:2,PO2:2,HCO3:2 in the last 72 hours Results for orders placed during the hospital encounter of 02/06/12  URINE CULTURE     Status: Normal   Collection Time   02/06/12  2:15 PM      Component Value Range Status Comment   Specimen Description URINE, CLEAN CATCH   Final    Special Requests NONE   Final    Culture  Setup Time 02/06/2012 19:08   Final    Colony Count 20,OOO COLONIES/ML   Final    Culture     Final    Value: Multiple bacterial morphotypes present, none predominant. Suggest appropriate recollection if clinically indicated.   Report Status 02/07/2012 FINAL   Final   MRSA PCR SCREENING     Status: Normal   Collection Time   02/07/12 12:42 AM      Component Value Range Status Comment   MRSA by PCR NEGATIVE  NEGATIVE Final   CULTURE, ROUTINE-ABSCESS     Status: Normal   Collection Time   02/07/12 10:22 AM      Component Value Range Status Comment   Specimen  Description DRAINAGE   Final    Special Requests Normal   Final    Gram Stain     Final    Value: ABUNDANT WBC PRESENT, PREDOMINANTLY PMN     NO SQUAMOUS EPITHELIAL CELLS SEEN     NO ORGANISMS SEEN   Culture NO GROWTH 3 DAYS   Final    Report Status 02/10/2012 FINAL   Final   BODY FLUID CULTURE     Status: Normal   Collection Time   02/12/12  9:53 AM      Component Value Range Status Comment   Specimen Description PLEURAL RIGHT   Final    Special Requests Normal   Final    Gram Stain     Final    Value: FEW WBC PRESENT, PREDOMINANTLY PMN     NO ORGANISMS SEEN   Culture NO GROWTH 3 DAYS   Final    Report Status 02/16/2012 FINAL   Final   CLOSTRIDIUM DIFFICILE BY PCR     Status: Normal   Collection Time   02/14/12 10:00 AM      Component Value Range Status Comment   C difficile by pcr NEGATIVE  NEGATIVE Final   CULTURE, ROUTINE-ABSCESS     Status: Normal (  Preliminary result)   Collection Time   02/23/12 12:38 PM      Component Value Range Status Comment   Specimen Description PERITONEAL CAVITY   Final    Special Requests NONE   Final    Gram Stain     Final    Value: MODERATE WBC PRESENT,BOTH PMN AND MONONUCLEAR     NO SQUAMOUS EPITHELIAL CELLS SEEN     FEW GRAM NEGATIVE RODS     FEW YEAST   Culture NO GROWTH   Final    Report Status PENDING   Incomplete     Studies/Results: Ct Guided Abscess Drain  02/23/2012  *RADIOLOGY REPORT*  Clinical Data/Indication: RIGHT RETROPERITONEAL ABSCESS.  CT GUIDED ABCESS DRAINAGE WITH CATHETER  Sedation: Versed 1.0 mg, Fentanyl 50 mg.  Total Moderate Sedation Time: 15 minutes.  Procedure: The procedure, risks, benefits, and alternatives were explained to the patient. Questions regarding the procedure were encouraged and answered. The patient understands and consents to the procedure.  The right back was prepped with betadine in a sterile fashion, and a sterile drape was applied covering the operative field. A mask and sterile gloves were used  for the procedure.  Under CT guidance, an 18 gauge needle was inserted into the right retroperitoneal abscess via posterior approach.  It was removed over an Amplatz.  A 12-French dilator followed by a 12-French drain were inserted. It was looped and string fixed within the abscess cavity and sewn to the skin.  Frank pus was aspirated.  Findings: Imaging demonstrates 12-French drain placement into the right retroperitoneal abscess via posterior approach.  Complications: None.  IMPRESSION: Successful right retroperitoneal abscess drain yielding pus.   Original Report Authenticated By: Jolaine Click, M.D.    Ct Abdomen Pelvis W Contrast  02/22/2012  *RADIOLOGY REPORT*  Clinical Data: Patient is status post ileocolonic resection for perforation.  Patient history of (Crohn's disease) .  CT ABDOMEN AND PELVIS WITH CONTRAST  Technique:  Multidetector CT imaging of the abdomen and pelvis was performed following the standard protocol during bolus administration of intravenous contrast.  Contrast: OMNIPAQUE IOHEXOL 300 MG/ML  SOLN  Comparison: CT 10/30 1013, 02/11/2012, 01/23/2012  Findings: The dominant finding is new abscess in the right abdomen involving the retroperitoneal space inferior to the right kidney and lateral to the psoas muscle as well as extending into the peritoneal space anterior to the right kidney and more centrally inferior to the third portion the duodenum.  These new abscess are in the vicinity of the enteric colonic  anastomosis in the right abdomen (image 40).  The retroperitoneal portion of the abscess measures 7 cm x 5 cm (image 44).  The portion anterior right kidney measures 4.5 x 2.6 cm.  The portion inferior to the duodenum measures 5.2 x 3.4 cm. All three collections appear to communicate centrally.  On the most recent CT scan and there was a fluid collection described in the retroperitoneum at this location.  There is again demonstrated a rim of subcapsular low attenuation surrounding  the liver consistent with abscess.  This is not improved compared to prior.  Similar findings surrounding the spleen also unchanged.  There is a moderate loculated right pleural effusion with passive atelectasis unchanged.   Smaller effusion on the left.  No pericardial fluid.  No biliary ductal dilatation.  The stomach appears normal.  The bowel is grossly normal.  The distal colon is normal.  The pancreas appears normal.  Abdominal aorta is normal.  In the  pelvis, the bladder uterus are normal.  The ovaries are normal.  There is a midline ventral wound without evidence residual abscess.  IMPRESSION:  1.  Dominant finding is a new abscess within the right abdomen involving the retroperitoneum as well as the intraperitoneal spaces which appears to be involved with the enteric colonic anastomoses. Concern for leak at the anastomoses and abscess formation. 2. Continued concern for subcapsular abscesses  involving the liver and spleen which are unchanged from prior.  3.  Stable bilateral pleural effusions  and associated atelectasis.  Findings discussed with Dr. Dwain Sarna on 02/22/2012 at 7:20 pm hours.   Original Report Authenticated By: Genevive Bi, M.D.     Anti-infectives: Anti-infectives     Start     Dose/Rate Route Frequency Ordered Stop   02/24/12 0100   piperacillin-tazobactam (ZOSYN) IVPB 3.375 g        3.375 g 12.5 mL/hr over 240 Minutes Intravenous 3 times per day 02/23/12 1807     02/20/12 1200   piperacillin-tazobactam (ZOSYN) IVPB 3.375 g  Status:  Discontinued        3.375 g 12.5 mL/hr over 240 Minutes Intravenous 4 times per day 02/20/12 1010 02/23/12 1807   02/14/12 1000   metroNIDAZOLE (FLAGYL) IVPB 500 mg  Status:  Discontinued        500 mg 100 mL/hr over 60 Minutes Intravenous Every 6 hours 02/14/12 0924 02/16/12 1043   02/08/12 1400   ceFEPIme (MAXIPIME) 1 g in dextrose 5 % 50 mL IVPB  Status:  Discontinued        1 g 100 mL/hr over 30 Minutes Intravenous 3 times per day  02/08/12 1137 02/08/12 1141   02/08/12 1400   piperacillin-tazobactam (ZOSYN) IVPB 3.375 g  Status:  Discontinued        3.375 g 12.5 mL/hr over 240 Minutes Intravenous Every 8 hours 02/08/12 1145 02/18/12 0721   02/08/12 1300   levofloxacin (LEVAQUIN) IVPB 750 mg  Status:  Discontinued        750 mg 100 mL/hr over 90 Minutes Intravenous Every 24 hours 02/08/12 1137 02/08/12 1141   02/06/12 1800   vancomycin (VANCOCIN) IVPB 1000 mg/200 mL premix  Status:  Discontinued        1,000 mg 200 mL/hr over 60 Minutes Intravenous Every 12 hours 02/06/12 1448 02/08/12 1133   02/06/12 1445   ceFEPIme (MAXIPIME) 1 g in dextrose 5 % 50 mL IVPB  Status:  Discontinued        1 g 100 mL/hr over 30 Minutes Intravenous 3 times per day 02/06/12 1431 02/08/12 1137   02/06/12 1445   levofloxacin (LEVAQUIN) IVPB 750 mg  Status:  Discontinued        750 mg 100 mL/hr over 90 Minutes Intravenous Every 24 hours 02/06/12 1431 02/08/12 1137          Assessment/Plan: 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 Incentive spirometry, ambulate.   LOS: 18 days    Kidus Delman,D [facility] 02/24/2012

## 2012-02-24 NOTE — Progress Notes (Signed)
Patient ID: CAPTOLA TESCHNER, female   DOB: 02/21/1946, 66 y.o.   MRN: [id number] Patient ID: KEAISHA SUBLETTE, female   DOB: 1946-01-29, 66 y.o.   MRN: [id number] 5 Days Post-Op  Subjective:  Having some pain this morning, related to no pain meds since 3am.  Otherwise doing well; wound vac in place and functioning well.  Objective: Vital signs in last 24 hours: Temp:  [97.5 F (36.4 C)-98.6 F (37 C)] 97.7 F (36.5 C) (11/06 0630) Pulse Rate:  [71-88] 78  (11/06 0630) Resp:  [16-49] 16  (11/06 0630) BP: (141-168)/(62-77) 154/75 mmHg (11/06 0630) SpO2:  [94 %-100 %] 98 % (11/06 0630) Last BM Date: 02/23/12  Intake/Output from previous day: 11/05 0701 - 11/06 0700 In: 2437.5 [I.V.:2437.5] Out: 475 [Urine:475] Intake/Output this shift:    General appearance: alert, cooperative, appears stated age, fatigued and no distress Chest: CTA bilaterally Cardiac: RRR No M/R/G on ascultation. Abdomen: Woundf vac in place (no output recorded). +BS Perc drain placed yesterday: Frank pus drained; no out put in bag this am. Extremities: + pulses bilaterally, no swelling or tenderness. Labs: pending  VSS, afebrile.  Lab Results:   Basename 02/22/12 1010  WBC 16.8*  HGB 9.3*  HCT 28.0*  PLT 431*   BMET  Basename 02/22/12 1010  NA 135  K 3.7  CL 105  CO2 19  GLUCOSE 183*  BUN 10  CREATININE 0.73  CALCIUM 7.8*   PT/INR No results found for this basename: LABPROT:2,INR:2 in the last 72 hours ABG No results found for this basename: PHART:2,PCO2:2,PO2:2,HCO3:2 in the last 72 hours  Studies/Results: Ct Guided Abscess Drain  02/23/2012  *RADIOLOGY REPORT*  Clinical Data/Indication: RIGHT RETROPERITONEAL ABSCESS.  CT GUIDED ABCESS DRAINAGE WITH CATHETER  Sedation: Versed 1.0 mg, Fentanyl 50 mg.  Total Moderate Sedation Time: 15 minutes.  Procedure: The procedure, risks, benefits, and alternatives were explained to the patient. Questions regarding the procedure were encouraged  and answered. The patient understands and consents to the procedure.  The right back was prepped with betadine in a sterile fashion, and a sterile drape was applied covering the operative field. A mask and sterile gloves were used for the procedure.  Under CT guidance, an 18 gauge needle was inserted into the right retroperitoneal abscess via posterior approach.  It was removed over an Amplatz.  A 12-French dilator followed by a 12-French drain were inserted. It was looped and string fixed within the abscess cavity and sewn to the skin.  Frank pus was aspirated.  Findings: Imaging demonstrates 12-French drain placement into the right retroperitoneal abscess via posterior approach.  Complications: None.  IMPRESSION: Successful right retroperitoneal abscess drain yielding pus.   Original Report Authenticated By: Jolaine Click, M.D.    Ct Abdomen Pelvis W Contrast  02/22/2012  *RADIOLOGY REPORT*  Clinical Data: Patient is status post ileocolonic resection for perforation.  Patient history of (Crohn's disease) .  CT ABDOMEN AND PELVIS WITH CONTRAST  Technique:  Multidetector CT imaging of the abdomen and pelvis was performed following the standard protocol during bolus administration of intravenous contrast.  Contrast: OMNIPAQUE IOHEXOL 300 MG/ML  SOLN  Comparison: CT 10/30 1013, 02/11/2012, 01/23/2012  Findings: The dominant finding is new abscess in the right abdomen involving the retroperitoneal space inferior to the right kidney and lateral to the psoas muscle as well as extending into the peritoneal space anterior to the right kidney and more centrally inferior to the third portion the duodenum.  These new abscess are  in the vicinity of the enteric colonic  anastomosis in the right abdomen (image 40).  The retroperitoneal portion of the abscess measures 7 cm x 5 cm (image 44).  The portion anterior right kidney measures 4.5 x 2.6 cm.  The portion inferior to the duodenum measures 5.2 x 3.4 cm. All three  collections appear to communicate centrally.  On the most recent CT scan and there was a fluid collection described in the retroperitoneum at this location.  There is again demonstrated a rim of subcapsular low attenuation surrounding the liver consistent with abscess.  This is not improved compared to prior.  Similar findings surrounding the spleen also unchanged.  There is a moderate loculated right pleural effusion with passive atelectasis unchanged.   Smaller effusion on the left.  No pericardial fluid.  No biliary ductal dilatation.  The stomach appears normal.  The bowel is grossly normal.  The distal colon is normal.  The pancreas appears normal.  Abdominal aorta is normal.  In the pelvis, the bladder uterus are normal.  The ovaries are normal.  There is a midline ventral wound without evidence residual abscess.  IMPRESSION:  1.  Dominant finding is a new abscess within the right abdomen involving the retroperitoneum as well as the intraperitoneal spaces which appears to be involved with the enteric colonic anastomoses. Concern for leak at the anastomoses and abscess formation. 2. Continued concern for subcapsular abscesses  involving the liver and spleen which are unchanged from prior.  3.  Stable bilateral pleural effusions  and associated atelectasis.  Findings discussed with Dr. Dwain Sarna on 02/22/2012 at 7:20 pm hours.   Original Report Authenticated By: Genevive Bi, M.D.     Anti-infectives: Anti-infectives     Start     Dose/Rate Route Frequency Ordered Stop   02/24/12 0100   piperacillin-tazobactam (ZOSYN) IVPB 3.375 g        3.375 g 12.5 mL/hr over 240 Minutes Intravenous 3 times per day 02/23/12 1807     02/20/12 1200   piperacillin-tazobactam (ZOSYN) IVPB 3.375 g  Status:  Discontinued        3.375 g 12.5 mL/hr over 240 Minutes Intravenous 4 times per day 02/20/12 1010 02/23/12 1807   02/14/12 1000   metroNIDAZOLE (FLAGYL) IVPB 500 mg  Status:  Discontinued        500 mg 100  mL/hr over 60 Minutes Intravenous Every 6 hours 02/14/12 0924 02/16/12 1043   02/08/12 1400   ceFEPIme (MAXIPIME) 1 g in dextrose 5 % 50 mL IVPB  Status:  Discontinued        1 g 100 mL/hr over 30 Minutes Intravenous 3 times per day 02/08/12 1137 02/08/12 1141   02/08/12 1400   piperacillin-tazobactam (ZOSYN) IVPB 3.375 g  Status:  Discontinued        3.375 g 12.5 mL/hr over 240 Minutes Intravenous Every 8 hours 02/08/12 1145 02/18/12 0721   02/08/12 1300   levofloxacin (LEVAQUIN) IVPB 750 mg  Status:  Discontinued        750 mg 100 mL/hr over 90 Minutes Intravenous Every 24 hours 02/08/12 1137 02/08/12 1141   02/06/12 1800   vancomycin (VANCOCIN) IVPB 1000 mg/200 mL premix  Status:  Discontinued        1,000 mg 200 mL/hr over 60 Minutes Intravenous Every 12 hours 02/06/12 1448 02/08/12 1133   02/06/12 1445   ceFEPIme (MAXIPIME) 1 g in dextrose 5 % 50 mL IVPB  Status:  Discontinued  1 g 100 mL/hr over 30 Minutes Intravenous 3 times per day 02/06/12 1431 02/08/12 1137   02/06/12 1445   levofloxacin (LEVAQUIN) IVPB 750 mg  Status:  Discontinued        750 mg 100 mL/hr over 90 Minutes Intravenous Every 24 hours 02/06/12 1431 02/08/12 1137          Assessment/Plan:  Patient Active Problem List  Diagnosis  . 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 Perforated viscus  . Abdominal abscess  . Sepsis  . Acute blood loss anemia  . Pleural effusion, bilateral  . Atelectasis  s/p Procedure(s) (LRB) with comments: ESOPHAGOGASTRODUODENOSCOPY (EGD) (N/A) 1. Continue wound care, (change wound vac M,W,Fri) 2. Ambulate/OOB    LOS: 18 days    Blenda Mounts [facility] Surgery Pager # [phone] 02/24/2012

## 2012-02-24 NOTE — Progress Notes (Signed)
Dressing changed to right lower back.  Dry dressing applied, small amount of yellow/bloody drainage noted on old dressing.  As per surgery, Dr. Gerrit Friends since wound vac applied yesterday, first dressing change will be on Friday.  Will continue to monitor.

## 2012-02-24 NOTE — Progress Notes (Signed)
Physical Therapy Treatment Patient Details Name: Michele David MRN: [id number] DOB: 08/09/1945 Today's Date: 02/24/2012 Time: 5400-8676 PT Time Calculation (min): 23 min  PT Assessment / Plan / Recommendation Comments on Treatment Session  New drain placed on 02/23/12. Pt also has wound vac now. Mobilizing slowly but fairly well.     Follow Up Recommendations  Home health PT;Supervision/Assistance - 24 hour (declines SNF)     Does the patient have the potential to tolerate intense rehabilitation     Barriers to Discharge        Equipment Recommendations  Rolling walker with 5" wheels;3 in 1 bedside comode    Recommendations for Other Services    Frequency Min 3X/week   Plan Discharge plan remains appropriate    Precautions / Restrictions Precautions Precautions: Fall Precaution Comments: drain R flank, wound vac Restrictions Weight Bearing Restrictions: No   Pertinent Vitals/Pain R side "okay"    Mobility  Bed Mobility Bed Mobility: Supine to Sit;Sit to Supine Supine to Sit: 5: Supervision;HOB elevated;With rails Sit to Supine: 5: Supervision;HOB elevated;With rail Details for Bed Mobility Assistance: Increased time. Supervision to monitor lines,drains.  Transfers Transfers: Sit to Stand;Stand to [employer] Transfers Sit to Stand: 4: Min guard;From bed Stand to Sit: 4: Min guard;To bed Stand Pivot Transfers: 4: Min guard Ambulation/Gait Ambulation/Gait Assistance: 4: Min guard Ambulation Distance (Feet): 100 Feet Assistive device: Rolling walker Ambulation/Gait Assistance Details: Slow gait speed. Slightly unsteady. Pt declined 2nd walk at this time, but states she will walk later with nursing Gait Pattern: Step-through pattern;Decreased stride length;Decreased step length - right;Decreased step length - left    Exercises     PT Diagnosis:    PT Problem List:   PT Treatment Interventions:     PT Goals Acute Rehab PT Goals Time For Goal  Achievement: 03/09/12 Potential to Achieve Goals: Good Pt will go Supine/Side to Sit: Independently PT Goal: Supine/Side to Sit - Progress: Goal set today Pt will go Sit to Supine/Side: Independently PT Goal: Sit to Supine/Side - Progress: Goal set today Pt will go Sit to Stand: with modified independence PT Goal: Sit to Stand - Progress: Goal set today Pt will go Stand to Sit: with modified independence PT Goal: Stand to Sit - Progress: Goal set today Pt will Ambulate: 51 - 150 feet;with modified independence;with least restrictive assistive device PT Goal: Ambulate - Progress: Goal set today Pt will Go Up / Down Stairs: Flight;with min assist;with rail(s) PT Goal: Up/Down Stairs - Progress: Goal set today  Visit Information  Last PT Received On: 02/24/12    Subjective Data  Subjective: "Now is a good time to do this" Patient Stated Goal: Home   Cognition  Overall Cognitive Status: Appears within functional limits for tasks assessed/performed Arousal/Alertness: Awake/alert Orientation Level: Appears intact for tasks assessed Behavior During Session: [facility] for tasks performed    Balance     End of Session PT - End of Session Activity Tolerance: Patient tolerated treatment well Patient left: in bed;with call bell/phone within reach;with family/visitor present   GP     [name] [facility] 02/24/2012, 11:50 AM [id number]

## 2012-02-25 LAB — BASIC METABOLIC PANEL
Calcium: 7.9 mg/dL — ABNORMAL LOW (ref 8.4–10.5)
GFR calc Af Amer: >90 mL/min (ref >90)
GFR calc non Af Amer: 88 mL/min — ABNORMAL LOW (ref >90)
Potassium: 3.8 mEq/L (ref 3.5–5.1)
Sodium: 137 mEq/L (ref 135–145)

## 2012-02-25 NOTE — Progress Notes (Signed)
Patient ID: Michele David, female   DOB: 04/16/1946, 66 y.o.   MRN: [id number] Patient ID: Michele David, female   DOB: 12-Jan-1946, 66 y.o.   MRN: [id number] Patient ID: Michele David, female   DOB: 10-Aug-1945, 66 y.o.   MRN: [id number] 6 Days Post-Op  Subjective:  Minimal pain this morning, related to perc drain site only. Otherwise doing well; wound vac in place and functioning well. Report of 3 beat Vtach last pm no reoccurrence since.   Objective: Vital signs in last 24 hours: Temp:  [97.6 F (36.4 C)-98.2 F (36.8 C)] 97.6 F (36.4 C) (11/07 0525) Pulse Rate:  [67-71] 67  (11/07 0525) Resp:  [18-20] 18  (11/07 0525) BP: (153-162)/(71-75) 154/75 mmHg (11/07 0525) SpO2:  [92 %-98 %] 92 % (11/07 0525) Last BM Date: 02/23/12  Intake/Output from previous day: 11/06 0701 - 11/07 0700 In: 2098.8 [P.O.:480; I.V.:1418.8; IV Piggyback:150] Out: 125 [Urine:125] Intake/Output this shift:    General appearance: alert, cooperative, appears stated age, fatigued and no distress Chest: CTA bilaterally Cardiac: RRR No M/R/G on ascultation. Abdomen: Woundf vac in place (no output recorded). +BS Perc drain placed yesterday: Frank pus drained; no out put recorded (1-2 cc in bag this am.) Extremities: + pulses bilaterally, no swelling or tenderness. Labs:Wbc trending downward H&H stable  VSS, afebrile.  Lab Results:   Basename 02/24/12 0750 02/22/12 1010  WBC 13.7* 16.8*  HGB 9.0* 9.3*  HCT 27.4* 28.0*  PLT 423* 431*   BMET  Basename 02/22/12 1010  NA 135  K 3.7  CL 105  CO2 19  GLUCOSE 183*  BUN 10  CREATININE 0.73  CALCIUM 7.8*   PT/INR No results found for this basename: LABPROT:2,INR:2 in the last 72 hours ABG No results found for this basename: PHART:2,PCO2:2,PO2:2,HCO3:2 in the last 72 hours  Studies/Results: Ct Guided Abscess Drain  02/23/2012  *RADIOLOGY REPORT*  Clinical Data/Indication: RIGHT RETROPERITONEAL ABSCESS.  CT GUIDED ABCESS DRAINAGE  WITH CATHETER  Sedation: Versed 1.0 mg, Fentanyl 50 mg.  Total Moderate Sedation Time: 15 minutes.  Procedure: The procedure, risks, benefits, and alternatives were explained to the patient. Questions regarding the procedure were encouraged and answered. The patient understands and consents to the procedure.  The right back was prepped with betadine in a sterile fashion, and a sterile drape was applied covering the operative field. A mask and sterile gloves were used for the procedure.  Under CT guidance, an 18 gauge needle was inserted into the right retroperitoneal abscess via posterior approach.  It was removed over an Amplatz.  A 12-French dilator followed by a 12-French drain were inserted. It was looped and string fixed within the abscess cavity and sewn to the skin.  Frank pus was aspirated.  Findings: Imaging demonstrates 12-French drain placement into the right retroperitoneal abscess via posterior approach.  Complications: None.  IMPRESSION: Successful right retroperitoneal abscess drain yielding pus.   Original Report Authenticated By: Jolaine Click, M.D.     Anti-infectives: Anti-infectives     Start     Dose/Rate Route Frequency Ordered Stop   02/24/12 0100   piperacillin-tazobactam (ZOSYN) IVPB 3.375 g        3.375 g 12.5 mL/hr over 240 Minutes Intravenous 3 times per day 02/23/12 1807     02/20/12 1200   piperacillin-tazobactam (ZOSYN) IVPB 3.375 g  Status:  Discontinued        3.375 g 12.5 mL/hr over 240 Minutes Intravenous 4 times per day 02/20/12 1010 02/23/12 1807  02/14/12 1000   metroNIDAZOLE (FLAGYL) IVPB 500 mg  Status:  Discontinued        500 mg 100 mL/hr over 60 Minutes Intravenous Every 6 hours 02/14/12 0924 02/16/12 1043   02/08/12 1400   ceFEPIme (MAXIPIME) 1 g in dextrose 5 % 50 mL IVPB  Status:  Discontinued        1 g 100 mL/hr over 30 Minutes Intravenous 3 times per day 02/08/12 1137 02/08/12 1141   02/08/12 1400   piperacillin-tazobactam (ZOSYN) IVPB 3.375 g   Status:  Discontinued        3.375 g 12.5 mL/hr over 240 Minutes Intravenous Every 8 hours 02/08/12 1145 02/18/12 0721   02/08/12 1300   levofloxacin (LEVAQUIN) IVPB 750 mg  Status:  Discontinued        750 mg 100 mL/hr over 90 Minutes Intravenous Every 24 hours 02/08/12 1137 02/08/12 1141   02/06/12 1800   vancomycin (VANCOCIN) IVPB 1000 mg/200 mL premix  Status:  Discontinued        1,000 mg 200 mL/hr over 60 Minutes Intravenous Every 12 hours 02/06/12 1448 02/08/12 1133   02/06/12 1445   ceFEPIme (MAXIPIME) 1 g in dextrose 5 % 50 mL IVPB  Status:  Discontinued        1 g 100 mL/hr over 30 Minutes Intravenous 3 times per day 02/06/12 1431 02/08/12 1137   02/06/12 1445   levofloxacin (LEVAQUIN) IVPB 750 mg  Status:  Discontinued        750 mg 100 mL/hr over 90 Minutes Intravenous Every 24 hours 02/06/12 1431 02/08/12 1137          Assessment/Plan:  Patient Active Problem List  Diagnosis  . 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 Perforated viscus  . Abdominal abscess  . Sepsis  . Acute blood loss anemia  . Pleural effusion, bilateral  . Atelectasis  s/p Procedure(s) (LRB) with comments: ESOPHAGOGASTRODUODENOSCOPY (EGD) (N/A) 1. Continue wound care, (change wound vac M,W,Fri) 2. Perc drain management per IR, Will need repeat CT scan prior to removing drain catheter. Will request injection of drain catheter to rule out fistula formation prior to removal.  3. Continue IV abx 3. Ambulate/OOB    LOS: 19 days    Blenda Mounts [facility] Surgery Pager # [phone] 02/25/2012

## 2012-02-25 NOTE — Progress Notes (Signed)
6 Days Post-Op  Subjective: Pt without acute changes; sitting up in chair  Objective: Vital signs in last 24 hours: Temp:  [97.6 F (36.4 C)-98.2 F (36.8 C)] 97.6 F (36.4 C) (11/07 0525) Pulse Rate:  [67-71] 67  (11/07 0525) Resp:  [18-20] 18  (11/07 0525) BP: (154-162)/(71-75) 154/75 mmHg (11/07 0525) SpO2:  [92 %-98 %] 92 % (11/07 0525) Last BM Date: 02/23/12  Intake/Output from previous day: 11/06 0701 - 11/07 0700 In: 2098.8 [P.O.:480; I.V.:1418.8; IV Piggyback:150] Out: 125 [Urine:125] Intake/Output this shift:    Right RP drain intact, insertion site sl tender, output about 15 cc's in bag now, cx's pend  Lab Results:   [facility] 02/24/12 0750  WBC 13.7*  HGB 9.0*  HCT 27.4*  PLT 423*   BMET  Basename 02/25/12 0905  NA 137  K 3.8  CL 106  CO2 21  GLUCOSE 126*  BUN 5*  CREATININE 0.70  CALCIUM 7.9*   PT/INR No results found for this basename: LABPROT:2,INR:2 in the last 72 hours ABG No results found for this basename: PHART:2,PCO2:2,PO2:2,HCO3:2 in the last 72 hours  Studies/Results: Ct Guided Abscess Drain  02/23/2012  *RADIOLOGY REPORT*  Clinical Data/Indication: RIGHT RETROPERITONEAL ABSCESS.  CT GUIDED ABCESS DRAINAGE WITH CATHETER  Sedation: Versed 1.0 mg, Fentanyl 50 mg.  Total Moderate Sedation Time: 15 minutes.  Procedure: The procedure, risks, benefits, and alternatives were explained to the patient. Questions regarding the procedure were encouraged and answered. The patient understands and consents to the procedure.  The right back was prepped with betadine in a sterile fashion, and a sterile drape was applied covering the operative field. A mask and sterile gloves were used for the procedure.  Under CT guidance, an 18 gauge needle was inserted into the right retroperitoneal abscess via posterior approach.  It was removed over an Amplatz.  A 12-French dilator followed by a 12-French drain were inserted. It was looped and string fixed within the  abscess cavity and sewn to the skin.  Frank pus was aspirated.  Findings: Imaging demonstrates 12-French drain placement into the right retroperitoneal abscess via posterior approach.  Complications: None.  IMPRESSION: Successful right retroperitoneal abscess drain yielding pus.   Original Report Authenticated By: Jolaine Click, M.D.    Results for orders placed during the hospital encounter of 02/06/12  URINE CULTURE     Status: Normal   Collection Time   02/06/12  2:15 PM      Component Value Range Status Comment   Specimen Description URINE, CLEAN CATCH   Final    Special Requests NONE   Final    Culture  Setup Time 02/06/2012 19:08   Final    Colony Count 20,OOO COLONIES/ML   Final    Culture     Final    Value: Multiple bacterial morphotypes present, none predominant. Suggest appropriate recollection if clinically indicated.   Report Status 02/07/2012 FINAL   Final   MRSA PCR SCREENING     Status: Normal   Collection Time   02/07/12 12:42 AM      Component Value Range Status Comment   MRSA by PCR NEGATIVE  NEGATIVE Final   CULTURE, ROUTINE-ABSCESS     Status: Normal   Collection Time   02/07/12 10:22 AM      Component Value Range Status Comment   Specimen Description DRAINAGE   Final    Special Requests Normal   Final    Gram Stain     Final    Value:  ABUNDANT WBC PRESENT, PREDOMINANTLY PMN     NO SQUAMOUS EPITHELIAL CELLS SEEN     NO ORGANISMS SEEN   Culture NO GROWTH 3 DAYS   Final    Report Status 02/10/2012 FINAL   Final   BODY FLUID CULTURE     Status: Normal   Collection Time   02/12/12  9:53 AM      Component Value Range Status Comment   Specimen Description PLEURAL RIGHT   Final    Special Requests Normal   Final    Gram Stain     Final    Value: FEW WBC PRESENT, PREDOMINANTLY PMN     NO ORGANISMS SEEN   Culture NO GROWTH 3 DAYS   Final    Report Status 02/16/2012 FINAL   Final   CLOSTRIDIUM DIFFICILE BY PCR     Status: Normal   Collection Time   02/14/12 10:00  AM      Component Value Range Status Comment   C difficile by pcr NEGATIVE  NEGATIVE Final   CULTURE, ROUTINE-ABSCESS     Status: Normal (Preliminary result)   Collection Time   02/23/12 12:38 PM      Component Value Range Status Comment   Specimen Description PERITONEAL CAVITY   Final    Special Requests NONE   Final    Gram Stain     Final    Value: MODERATE WBC PRESENT,BOTH PMN AND MONONUCLEAR     NO SQUAMOUS EPITHELIAL CELLS SEEN     FEW GRAM NEGATIVE RODS     FEW YEAST   Culture FEW GRAM NEGATIVE RODS   Final    Report Status PENDING   Incomplete      Anti-infectives: Anti-infectives     Start     Dose/Rate Route Frequency Ordered Stop   02/24/12 0100   piperacillin-tazobactam (ZOSYN) IVPB 3.375 g        3.375 g 12.5 mL/hr over 240 Minutes Intravenous 3 times per day 02/23/12 1807     02/20/12 1200   piperacillin-tazobactam (ZOSYN) IVPB 3.375 g  Status:  Discontinued        3.375 g 12.5 mL/hr over 240 Minutes Intravenous 4 times per day 02/20/12 1010 02/23/12 1807   02/14/12 1000   metroNIDAZOLE (FLAGYL) IVPB 500 mg  Status:  Discontinued        500 mg 100 mL/hr over 60 Minutes Intravenous Every 6 hours 02/14/12 0924 02/16/12 1043   02/08/12 1400   ceFEPIme (MAXIPIME) 1 g in dextrose 5 % 50 mL IVPB  Status:  Discontinued        1 g 100 mL/hr over 30 Minutes Intravenous 3 times per day 02/08/12 1137 02/08/12 1141   02/08/12 1400   piperacillin-tazobactam (ZOSYN) IVPB 3.375 g  Status:  Discontinued        3.375 g 12.5 mL/hr over 240 Minutes Intravenous Every 8 hours 02/08/12 1145 02/18/12 0721   02/08/12 1300   levofloxacin (LEVAQUIN) IVPB 750 mg  Status:  Discontinued        750 mg 100 mL/hr over 90 Minutes Intravenous Every 24 hours 02/08/12 1137 02/08/12 1141   02/06/12 1800   vancomycin (VANCOCIN) IVPB 1000 mg/200 mL premix  Status:  Discontinued        1,000 mg 200 mL/hr over 60 Minutes Intravenous Every 12 hours 02/06/12 1448 02/08/12 1133   02/06/12 1445    ceFEPIme (MAXIPIME) 1 g in dextrose 5 % 50 mL IVPB  Status:  Discontinued  1 g 100 mL/hr over 30 Minutes Intravenous 3 times per day 02/06/12 1431 02/08/12 1137   02/06/12 1445   levofloxacin (LEVAQUIN) IVPB 750 mg  Status:  Discontinued        750 mg 100 mL/hr over 90 Minutes Intravenous Every 24 hours 02/06/12 1431 02/08/12 1137          Assessment/Plan: s/p right RP abscess drainage 11/5; check final cx's; monitor labs; check f/u CT /drain injection within 1 week of placement.   LOS: 19 days    Sanela Evola,D [facility] 02/25/2012

## 2012-02-25 NOTE — Progress Notes (Signed)
General Surgery [facility]  Patient seen & examined.  Drain with purulent output still.  Continue drainage and IV abx.  Velora Heckler, MD, [facility] Office: [phone]

## 2012-02-26 LAB — CULTURE, ROUTINE-ABSCESS

## 2012-02-26 LAB — CBC
Hemoglobin: 9.2 g/dL — ABNORMAL LOW (ref 12.0–15.0)
MCH: 26.8 pg (ref 26.0–34.0)
Platelets: 406 10*3/uL — ABNORMAL HIGH (ref 150–400)
RBC: 3.43 MIL/uL — ABNORMAL LOW (ref 3.87–5.11)

## 2012-02-26 NOTE — Progress Notes (Signed)
General Surgery [facility]  Agree with attached.  Continue abx and drainage.  Repeat CT and drain injection when output diminishes.  Overall improving clinically.  Velora Heckler, MD, [facility] Office: [phone]

## 2012-02-26 NOTE — Progress Notes (Signed)
Patient ID: Michele David, female   DOB: 1946/03/02, 66 y.o.   MRN: [id number] Patient ID: Michele David, female   DOB: 12-02-1945, 66 y.o.   MRN: [id number] Patient ID: Michele David, female   DOB: [birth year], 66 y.o.   MRN: [id number] Patient ID: Michele David, female   DOB: 02/25/1946, 66 y.o.   MRN: [id number] 7 Days Post-Op  Subjective:  Doing well; wound vac in place and functioning well. No new c/o offered, seems more upbeat this morning. Objective: Vital signs in last 24 hours: Temp:  [97.9 F (36.6 C)-98.6 F (37 C)] 98 F (36.7 C) (11/08 0209) Pulse Rate:  [64-79] 64  (11/08 0209) Resp:  [2-18] 18  (11/08 0209) BP: (145-176)/(63-85) 145/67 mmHg (11/08 0209) SpO2:  [97 %-100 %] 97 % (11/08 0209) Last BM Date: 02/25/12  Intake/Output from previous day: 11/07 0701 - 11/08 0700 In: 1140 [P.O.:240; I.V.:900] Out: 50 [Drains:50] Intake/Output this shift:    General appearance: A/A/O more positive outlook this morning, smiling, "ready to go home soon". Chest: CTA bilaterally Cardiac: RRR No M/R/G on ascultation. Abdomen: Woundf vac in place (no output recorded). +BS Perc drain remains in place, (50 ml output, still frank pus) Culture:  MODERATE WBC PRESENT,BOTH PMN AND MONONUCLEAR NO SQUAMOUS EPITHELIAL CELLS SEEN FEW GRAM NEGATIVE RODS FEW YEAST Culture FEW KLEBSIELLA PNEUMONIAE FEW YEAST CONSISTENT WITH CANDIDA SPECIES Report Status 02/26/2012 FINAL Organism ID, Bacteria KLEBSIELLA PNEUMONIAE On Zosyn which should cover this. Extremities: + pulses bilaterally, no swelling or tenderness. Labs:CBC pending  VSS, afebrile.  Lab Results:   [facility] 02/24/12 0750  WBC 13.7*  HGB 9.0*  HCT 27.4*  PLT 423*   BMET  Basename 02/25/12 0905  NA 137  K 3.8  CL 106  CO2 21  GLUCOSE 126*  BUN 5*  CREATININE 0.70  CALCIUM 7.9*   PT/INR No results found for this basename: LABPROT:2,INR:2 in the last 72 hours ABG No results found for this  basename: PHART:2,PCO2:2,PO2:2,HCO3:2 in the last 72 hours  Studies/Results: No results found.  Anti-infectives: Anti-infectives     Start     Dose/Rate Route Frequency Ordered Stop   02/24/12 0100   piperacillin-tazobactam (ZOSYN) IVPB 3.375 g        3.375 g 12.5 mL/hr over 240 Minutes Intravenous 3 times per day 02/23/12 1807     02/20/12 1200   piperacillin-tazobactam (ZOSYN) IVPB 3.375 g  Status:  Discontinued        3.375 g 12.5 mL/hr over 240 Minutes Intravenous 4 times per day 02/20/12 1010 02/23/12 1807   02/14/12 1000   metroNIDAZOLE (FLAGYL) IVPB 500 mg  Status:  Discontinued        500 mg 100 mL/hr over 60 Minutes Intravenous Every 6 hours 02/14/12 0924 02/16/12 1043   02/08/12 1400   ceFEPIme (MAXIPIME) 1 g in dextrose 5 % 50 mL IVPB  Status:  Discontinued        1 g 100 mL/hr over 30 Minutes Intravenous 3 times per day 02/08/12 1137 02/08/12 1141   02/08/12 1400   piperacillin-tazobactam (ZOSYN) IVPB 3.375 g  Status:  Discontinued        3.375 g 12.5 mL/hr over 240 Minutes Intravenous Every 8 hours 02/08/12 1145 02/18/12 0721   02/08/12 1300   levofloxacin (LEVAQUIN) IVPB 750 mg  Status:  Discontinued        750 mg 100 mL/hr over 90 Minutes Intravenous Every 24 hours 02/08/12 1137 02/08/12 1141   02/06/12 1800  vancomycin (VANCOCIN) IVPB 1000 mg/200 mL premix  Status:  Discontinued        1,000 mg 200 mL/hr over 60 Minutes Intravenous Every 12 hours 02/06/12 1448 02/08/12 1133   02/06/12 1445   ceFEPIme (MAXIPIME) 1 g in dextrose 5 % 50 mL IVPB  Status:  Discontinued        1 g 100 mL/hr over 30 Minutes Intravenous 3 times per day 02/06/12 1431 02/08/12 1137   02/06/12 1445   levofloxacin (LEVAQUIN) IVPB 750 mg  Status:  Discontinued        750 mg 100 mL/hr over 90 Minutes Intravenous Every 24 hours 02/06/12 1431 02/08/12 1137          Assessment/Plan:  Patient Active Problem List  Diagnosis  . 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  Perforated viscus  . Abdominal abscess  . Sepsis  . Acute blood loss anemia  . Pleural effusion, bilateral  . Atelectasis  s/p Procedure(s) (LRB) with comments: ESOPHAGOGASTRODUODENOSCOPY (EGD) (N/A) 1. Continue wound care, (change wound vac M,W,Fri) 2. Perc drain management per IR, Will need repeat CT scan prior to removing drain catheter. Will request injection of drain catheter to rule out fistula formation prior to removal.  3. Continue IV abx rechecking cbc today. 4. Ambulate/OOB    LOS: 20 days    Golda Acre [facility] Surgery Pager # [phone] 02/26/2012

## 2012-02-26 NOTE — Progress Notes (Signed)
Patient ID: Michele David, female   DOB: 1946/01/23, 66 y.o.   MRN: [id number] Patient ID: Michele David, female   DOB: 1945/10/22, 66 y.o.   MRN: [id number] Patient ID: Michele David, female   DOB: 11-14-1945, 66 y.o.   MRN: [id number] Patient ID: Michele David, female   DOB: [birth year], 66 y.o.   MRN: [id number] Patient ID: Michele David, female   DOB: Aug 02, 1945, 66 y.o.   MRN: [id number] 7 Days Post-Op  Subjective:  Doing well; wound vac in place and functioning well. No new c/o offered, seems more upbeat this morning. Objective: Vital signs in last 24 hours: Temp:  [97.9 F (36.6 C)-98.6 F (37 C)] 98 F (36.7 C) (11/08 0630) Pulse Rate:  [64-79] 64  (11/08 0630) Resp:  [2-18] 18  (11/08 0630) BP: (145-181)/(63-85) 181/68 mmHg (11/08 0630) SpO2:  [97 %-100 %] 98 % (11/08 0630) Last BM Date: 02/25/12  Intake/Output from previous day: 11/07 0701 - 11/08 0700 In: 1140 [P.O.:240; I.V.:900] Out: 50 [Drains:50] Intake/Output this shift: Total I/O In: -  Out: 250 [Urine:250]  General appearance: A/A/O more positive outlook this morning, smiling, "ready to go home soon". Chest: CTA bilaterally Cardiac: RRR No M/R/G on ascultation. Abdomen: Woundf vac in place (no output recorded). +BS Perc drain remains in place, (50 ml output, still frank pus) Culture:  MODERATE WBC PRESENT,BOTH PMN AND MONONUCLEAR NO SQUAMOUS EPITHELIAL CELLS SEEN FEW GRAM NEGATIVE RODS FEW YEAST Culture FEW KLEBSIELLA PNEUMONIAE FEW YEAST CONSISTENT WITH CANDIDA SPECIES Report Status 02/26/2012 FINAL Organism ID, Bacteria KLEBSIELLA PNEUMONIAE On Zosyn which should cover this. Extremities: + pulses bilaterally, no swelling or tenderness. Labs:CBC pending  VSS, afebrile.  Lab Results:   Basename 02/26/12 0830 02/24/12 0750  WBC 10.6* 13.7*  HGB 9.2* 9.0*  HCT 27.9* 27.4*  PLT 406* 423*   BMET  Basename 02/25/12 0905  NA 137  K 3.8  CL 106  CO2 21  GLUCOSE 126*  BUN  5*  CREATININE 0.70  CALCIUM 7.9*   PT/INR No results found for this basename: LABPROT:2,INR:2 in the last 72 hours ABG No results found for this basename: PHART:2,PCO2:2,PO2:2,HCO3:2 in the last 72 hours  Studies/Results: No results found.  Anti-infectives: Anti-infectives     Start     Dose/Rate Route Frequency Ordered Stop   02/24/12 0100   piperacillin-tazobactam (ZOSYN) IVPB 3.375 g        3.375 g 12.5 mL/hr over 240 Minutes Intravenous 3 times per day 02/23/12 1807     02/20/12 1200   piperacillin-tazobactam (ZOSYN) IVPB 3.375 g  Status:  Discontinued        3.375 g 12.5 mL/hr over 240 Minutes Intravenous 4 times per day 02/20/12 1010 02/23/12 1807   02/14/12 1000   metroNIDAZOLE (FLAGYL) IVPB 500 mg  Status:  Discontinued        500 mg 100 mL/hr over 60 Minutes Intravenous Every 6 hours 02/14/12 0924 02/16/12 1043   02/08/12 1400   ceFEPIme (MAXIPIME) 1 g in dextrose 5 % 50 mL IVPB  Status:  Discontinued        1 g 100 mL/hr over 30 Minutes Intravenous 3 times per day 02/08/12 1137 02/08/12 1141   02/08/12 1400   piperacillin-tazobactam (ZOSYN) IVPB 3.375 g  Status:  Discontinued        3.375 g 12.5 mL/hr over 240 Minutes Intravenous Every 8 hours 02/08/12 1145 02/18/12 0721   02/08/12 1300   levofloxacin (LEVAQUIN) IVPB 750 mg  Status:  Discontinued        750 mg 100 mL/hr over 90 Minutes Intravenous Every 24 hours 02/08/12 1137 02/08/12 1141   02/06/12 1800   vancomycin (VANCOCIN) IVPB 1000 mg/200 mL premix  Status:  Discontinued        1,000 mg 200 mL/hr over 60 Minutes Intravenous Every 12 hours 02/06/12 1448 02/08/12 1133   02/06/12 1445   ceFEPIme (MAXIPIME) 1 g in dextrose 5 % 50 mL IVPB  Status:  Discontinued        1 g 100 mL/hr over 30 Minutes Intravenous 3 times per day 02/06/12 1431 02/08/12 1137   02/06/12 1445   levofloxacin (LEVAQUIN) IVPB 750 mg  Status:  Discontinued        750 mg 100 mL/hr over 90 Minutes Intravenous Every 24 hours 02/06/12  1431 02/08/12 1137          Assessment/Plan:  Patient Active Problem List  Diagnosis  . 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 Perforated viscus  . Abdominal abscess  . Sepsis  . Acute blood loss anemia  . Pleural effusion, bilateral  . Atelectasis  s/p Procedure(s) (LRB) with comments: ESOPHAGOGASTRODUODENOSCOPY (EGD) (N/A) 1. Continue wound care, (change wound vac M,W,Fri) 2. Perc drain management per IR, Will need repeat CT scan prior to removing drain catheter. Will request injection of drain catheter to rule out fistula formation prior to removal.  3. Continue IV abx rechecking cbc today. 4. Ambulate/OOB    LOS: 20 days    Golda Acre [facility] Surgery Pager # [phone] 02/26/2012   Addennum:  Wound vac dressing removed and replaced by wound care RN. Interior of wound appears well perfused, beefy red tissue with thin necrotic edge of tissueon the borders. There is a central tract which opened up with gentle pressure from a qtip. Overall wound measures 17cm long x 5 cm wide x 7 cm deep there is some tan colored exudate in the center of the wound towards the caudal end. No odor, or other discharge.

## 2012-02-26 NOTE — Progress Notes (Signed)
General Surgery [facility]  Patient seen & examined.  Feels better today.  Continues with drain and abx's.  Plan repeat CT early next week with drain study.  Velora Heckler, MD, [facility] Office: [phone]

## 2012-02-26 NOTE — Consult Note (Signed)
WOC follow up Wound type: open surgical wound Measurement:17cm x 5cm x 5cm  Wound bed: early granulation along outer edge of wound bed, with CCS NP at bedside opened central portion of the wound with gentle pressure from sterile applicator, greatest depth at distal end with exposed sutures and some yellow slough in the base.   Drainage (amount, consistency, odor) minimal to none in canister, no odor Periwound: intact  Dressing procedure/placement/frequency: Mepitel applied to the deepest portion of the wound bed, 1pc of white foam in the deepest portion and then 1pc of black foam covering the entire wound bed. Seal obtained at . Pt tolerated without problems, did receive PO pain meds aprox. 30 mins prior to dressing change.  Plans for [facility] for NPWT, ok to go home with NS moist gauze dressing and have HHRN apply day of dc.    WOC team will follow along with you for assistance with NPWT dressing as needed. Keilen Kahl [place] RN,CWOCN [phone]

## 2012-02-26 NOTE — Progress Notes (Signed)
7 Days Post-Op  Subjective: Pt without new c/o  Objective: Vital signs in last 24 hours: Temp:  [97.9 F (36.6 C)-98.6 F (37 C)] 98 F (36.7 C) (11/08 0630) Pulse Rate:  [64-79] 64  (11/08 0630) Resp:  [2-18] 18  (11/08 0630) BP: (145-181)/(63-85) 181/68 mmHg (11/08 0630) SpO2:  [97 %-100 %] 98 % (11/08 0630) Last BM Date: 02/25/12  Intake/Output from previous day: 11/07 0701 - 11/08 0700 In: 1140 [P.O.:240; I.V.:900] Out: 50 [Drains:50] Intake/Output this shift:    Right RP drain intact, output 50 cc's thick, beige fluid ; drain flushed with 10 cc's sterile NS with return of thick, beige fluid admixed with air (? bowel contents), cx's pend  Lab Results:   Basename 02/26/12 0830 02/24/12 0750  WBC 10.6* 13.7*  HGB 9.2* 9.0*  HCT 27.9* 27.4*  PLT 406* 423*   BMET  Basename 02/25/12 0905  NA 137  K 3.8  CL 106  CO2 21  GLUCOSE 126*  BUN 5*  CREATININE 0.70  CALCIUM 7.9*   PT/INR No results found for this basename: LABPROT:2,INR:2 in the last 72 hours ABG No results found for this basename: PHART:2,PCO2:2,PO2:2,HCO3:2 in the last 72 hours Results for orders placed during the hospital encounter of 02/06/12  URINE CULTURE     Status: Normal   Collection Time   02/06/12  2:15 PM      Component Value Range Status Comment   Specimen Description URINE, CLEAN CATCH   Final    Special Requests NONE   Final    Culture  Setup Time 02/06/2012 19:08   Final    Colony Count 20,OOO COLONIES/ML   Final    Culture     Final    Value: Multiple bacterial morphotypes present, none predominant. Suggest appropriate recollection if clinically indicated.   Report Status 02/07/2012 FINAL   Final   MRSA PCR SCREENING     Status: Normal   Collection Time   02/07/12 12:42 AM      Component Value Range Status Comment   MRSA by PCR NEGATIVE  NEGATIVE Final   CULTURE, ROUTINE-ABSCESS     Status: Normal   Collection Time   02/07/12 10:22 AM      Component Value Range Status  Comment   Specimen Description DRAINAGE   Final    Special Requests Normal   Final    Gram Stain     Final    Value: ABUNDANT WBC PRESENT, PREDOMINANTLY PMN     NO SQUAMOUS EPITHELIAL CELLS SEEN     NO ORGANISMS SEEN   Culture NO GROWTH 3 DAYS   Final    Report Status 02/10/2012 FINAL   Final   BODY FLUID CULTURE     Status: Normal   Collection Time   02/12/12  9:53 AM      Component Value Range Status Comment   Specimen Description PLEURAL RIGHT   Final    Special Requests Normal   Final    Gram Stain     Final    Value: FEW WBC PRESENT, PREDOMINANTLY PMN     NO ORGANISMS SEEN   Culture NO GROWTH 3 DAYS   Final    Report Status 02/16/2012 FINAL   Final   CLOSTRIDIUM DIFFICILE BY PCR     Status: Normal   Collection Time   02/14/12 10:00 AM      Component Value Range Status Comment   C difficile by pcr NEGATIVE  NEGATIVE Final   CULTURE,  ROUTINE-ABSCESS     Status: Normal (Preliminary result)   Collection Time   02/23/12 12:38 PM      Component Value Range Status Comment   Specimen Description PERITONEAL CAVITY   Final    Special Requests NONE   Final    Gram Stain     Final    Value: MODERATE WBC PRESENT,BOTH PMN AND MONONUCLEAR     NO SQUAMOUS EPITHELIAL CELLS SEEN     FEW GRAM NEGATIVE RODS     FEW YEAST   Culture FEW GRAM NEGATIVE RODS   Final    Report Status PENDING   Incomplete     Studies/Results: No results found.  Anti-infectives: Anti-infectives     Start     Dose/Rate Route Frequency Ordered Stop   02/24/12 0100   piperacillin-tazobactam (ZOSYN) IVPB 3.375 g        3.375 g 12.5 mL/hr over 240 Minutes Intravenous 3 times per day 02/23/12 1807     02/20/12 1200   piperacillin-tazobactam (ZOSYN) IVPB 3.375 g  Status:  Discontinued        3.375 g 12.5 mL/hr over 240 Minutes Intravenous 4 times per day 02/20/12 1010 02/23/12 1807   02/14/12 1000   metroNIDAZOLE (FLAGYL) IVPB 500 mg  Status:  Discontinued        500 mg 100 mL/hr over 60 Minutes Intravenous  Every 6 hours 02/14/12 0924 02/16/12 1043   02/08/12 1400   ceFEPIme (MAXIPIME) 1 g in dextrose 5 % 50 mL IVPB  Status:  Discontinued        1 g 100 mL/hr over 30 Minutes Intravenous 3 times per day 02/08/12 1137 02/08/12 1141   02/08/12 1400   piperacillin-tazobactam (ZOSYN) IVPB 3.375 g  Status:  Discontinued        3.375 g 12.5 mL/hr over 240 Minutes Intravenous Every 8 hours 02/08/12 1145 02/18/12 0721   02/08/12 1300   levofloxacin (LEVAQUIN) IVPB 750 mg  Status:  Discontinued        750 mg 100 mL/hr over 90 Minutes Intravenous Every 24 hours 02/08/12 1137 02/08/12 1141   02/06/12 1800   vancomycin (VANCOCIN) IVPB 1000 mg/200 mL premix  Status:  Discontinued        1,000 mg 200 mL/hr over 60 Minutes Intravenous Every 12 hours 02/06/12 1448 02/08/12 1133   02/06/12 1445   ceFEPIme (MAXIPIME) 1 g in dextrose 5 % 50 mL IVPB  Status:  Discontinued        1 g 100 mL/hr over 30 Minutes Intravenous 3 times per day 02/06/12 1431 02/08/12 1137   02/06/12 1445   levofloxacin (LEVAQUIN) IVPB 750 mg  Status:  Discontinued        750 mg 100 mL/hr over 90 Minutes Intravenous Every 24 hours 02/06/12 1431 02/08/12 1137          Assessment/Plan: s/p right RP abscess drain placement 11/5; check final cx's;  once drain output less than 15-20 cc's per day excluding flush amt would recheck CT and perform injection of drain to r/o fistula to bowel (or could perform scan/inj at 1 week interval date of 11/12).  LOS: 20 days    ALLRED,D [facility] 02/26/2012

## 2012-02-26 NOTE — Progress Notes (Signed)
PT Cancellation Note  Patient Details Name: Michele David MRN: [id number] DOB: 11-11-1945   Cancelled Treatment:    Reason Eval/Treat Not Completed:  (pt had VAC changed and was in pain) at 1345.   Rada Hay 02/26/2012, 5:10 PM

## 2012-02-27 MED ORDER — METRONIDAZOLE IN NACL 5-0.79 MG/ML-% IV SOLN
500.0000 mg | Freq: Three times a day (TID) | INTRAVENOUS | Status: DC
  Administered 2012-02-27 – 2012-03-03 (×15): 500 mg via INTRAVENOUS
  Filled 2012-02-27 (×18): qty 100

## 2012-02-27 MED ORDER — DEXTROSE 5 % IV SOLN
1.0000 g | Freq: Once | INTRAVENOUS | Status: AC
  Administered 2012-02-27: 1 g via INTRAVENOUS
  Filled 2012-02-27: qty 10

## 2012-02-27 MED ORDER — DEXTROSE 5 % IV SOLN
1.0000 g | INTRAVENOUS | Status: DC
  Administered 2012-02-28 – 2012-03-02 (×4): 1 g via INTRAVENOUS
  Filled 2012-02-27 (×5): qty 10

## 2012-02-27 NOTE — Progress Notes (Signed)
Patient ID: Michele David, female   DOB: December 08, 1945, 66 y.o.   MRN: [id number]  General Surgery - [facility] - Progress Note  Subjective: Patient up on bedside commode.  Having "normal" BM's.  Tolerating diet.  No pain.  Objective: Vital signs in last 24 hours: Temp:  [97.9 F (36.6 C)-98.8 F (37.1 C)] 98 F (36.7 C) (11/09 0600) Pulse Rate:  [63-67] 66  (11/09 0600) Resp:  [18] 18  (11/09 0600) BP: (145-165)/(62-67) 165/63 mmHg (11/09 0600) SpO2:  [97 %-100 %] 100 % (11/09 0600) Last BM Date: 02/26/12  Intake/Output from previous day: 11/08 0701 - 11/09 0700 In: 2975 [I.V.:2625; IV Piggyback:350] Out: 250 [Urine:250]  Exam: HEENT - clear, not icteric Neck - soft Chest - clear bilaterally Cor - RRR, no murmur Abd - soft without distension; drain with moderate tan drainage Ext - no significant edema Neuro - grossly intact, no focal deficits  Lab Results:   Basename 02/26/12 0830  WBC 10.6*  HGB 9.2*  HCT 27.9*  PLT 406*     Basename 02/25/12 0905  NA 137  K 3.8  CL 106  CO2 21  GLUCOSE 126*  BUN 5*  CREATININE 0.70  CALCIUM 7.9*    Studies/Results: No results found.  Assessment / Plan: 1.  Status post ileocolonic resection for Crohn's disease, post op abscess formation  Perc drain remains in place with moderate output  IV Zosyn  Will need follow up CT abddomen early next week with drain injection  Velora Heckler, MD, [facility] Office: [phone]  02/27/2012

## 2012-02-28 NOTE — Progress Notes (Signed)
Patient ID: Michele David, female   DOB: 1946-04-07, 66 y.o.   MRN: [id number]  General Surgery - [facility] - Progress Note  Subjective: Patient without complaint this AM.  VAC dressing changed and intact.  Objective: Vital signs in last 24 hours: Temp:  [97.9 F (36.6 C)-98.8 F (37.1 C)] 98.8 F (37.1 C) (11/10 0600) Pulse Rate:  [61-70] 70  (11/10 0600) Resp:  [18] 18  (11/10 0600) BP: (149-173)/(70-73) 149/70 mmHg (11/10 0600) SpO2:  [98 %-100 %] 98 % (11/10 0600) Weight:  [161 lb (73.029 kg)-163 lb 6.4 oz (74.118 kg)] 161 lb (73.029 kg) (11/10 0600) Last BM Date: 02/27/12  Intake/Output from previous day: 11/09 0701 - 11/10 0700 In: 1240 [P.O.:480; I.V.:750] Out: 1372 [Urine:1350; Drains:22]  Exam: HEENT - clear, not icteric Neck - soft Chest - clear bilaterally Cor - RRR, no murmur Abd - soft without distension; VAC intact; drain output slightly thinner and more serous Ext - no significant edema Neuro - grossly intact, no focal deficits  Lab Results:   Basename 02/26/12 0830  WBC 10.6*  HGB 9.2*  HCT 27.9*  PLT 406*     Basename 02/25/12 0905  NA 137  K 3.8  CL 106  CO2 21  GLUCOSE 126*  BUN 5*  CREATININE 0.70  CALCIUM 7.9*    Studies/Results: No results found.  Assessment / Plan: 1. Status post ileocolonic resection for Crohn's disease, post op abscess formation   Perc drain remains in place with moderate output   Antibiotic therapy adjusted based on culture results - stopped Zosyn and started Ceftriaxone & Flagyl  Will need follow up CT abddomen early next week with drain injection   Velora Heckler, MD, [facility] Office: [phone]  02/28/2012

## 2012-02-29 ENCOUNTER — Other Ambulatory Visit (HOSPITAL_COMMUNITY): Payer: Medicare Other

## 2012-02-29 LAB — CBC
Hemoglobin: 8.6 g/dL — ABNORMAL LOW (ref 12.0–15.0)
MCH: 26.4 pg (ref 26.0–34.0)
MCV: 81 fL (ref 78.0–100.0)
Platelets: 365 10*3/uL (ref 150–400)
RBC: 3.26 MIL/uL — ABNORMAL LOW (ref 3.87–5.11)
WBC: 9.7 10*3/uL (ref 4.0–10.5)

## 2012-02-29 LAB — BASIC METABOLIC PANEL
CO2: 22 mEq/L (ref 19–32)
Calcium: 7.7 mg/dL — ABNORMAL LOW (ref 8.4–10.5)
GFR calc non Af Amer: 89 mL/min — ABNORMAL LOW (ref >90)
Glucose, Bld: 111 mg/dL — ABNORMAL HIGH (ref 70–99)
Potassium: 4 mEq/L (ref 3.5–5.1)
Sodium: 132 mEq/L — ABNORMAL LOW (ref 135–145)

## 2012-02-29 NOTE — Progress Notes (Signed)
I have seen and examined the patient and agree with the assessment and plans.  Will CT tomorrow  Lou Loewe A. Magnus Ivan  MD, FACS

## 2012-02-29 NOTE — Progress Notes (Signed)
Nutrition Follow-up  Intervention: Encouraged increased meal intake as appetite improves. Will monitor.   Diet Order:  Regular  - Pt reports her appetite is improving. Pt reports varied meal intake over the weekend, sometimes she would skip meals, other times she would eat very well. Pt reports eating better today after MD stop giving her the nausea medication - pt unable to say which one, when stopped, has been helping her.   Meds: Scheduled Meds:   . acetaminophen  650 mg Rectal Once  . antiseptic oral rinse  15 mL Mouth Rinse BID  . cefTRIAXone (ROCEPHIN)  IV  1 g Intravenous Q24H  . furosemide  20 mg Oral Daily  . heparin  5,000 Units Subcutaneous Q8H  . losartan  50 mg Oral Daily  . metronidazole  500 mg Intravenous Q8H  . ondansetron  4 mg Intravenous Q6H  . pantoprazole  40 mg Oral BID  . potassium chloride  30 mEq Oral TID PC  . sucralfate  1 g Oral TID WC & HS   Continuous Infusions:   . dextrose 5 % and 0.9 % NaCl with KCl 20 mEq/L 75 mL/hr at 02/29/12 1258   PRN Meds:.acetaminophen, fentaNYL, oxyCODONE-acetaminophen, sodium chloride   CMP     Component Value Date/Time   NA 132* 02/29/2012 0755   K 4.0 02/29/2012 0755   CL 104 02/29/2012 0755   CO2 22 02/29/2012 0755   GLUCOSE 111* 02/29/2012 0755   BUN 6 02/29/2012 0755   CREATININE 0.66 02/29/2012 0755   CALCIUM 7.7* 02/29/2012 0755   PROT 5.1* 02/19/2012 0455   ALBUMIN 1.4* 02/19/2012 0455   AST 8 02/19/2012 0455   ALT <5 02/19/2012 0455   ALKPHOS 126* 02/19/2012 0455   BILITOT 0.3 02/19/2012 0455   GFRNONAA 89* 02/29/2012 0755   GFRAA >90 02/29/2012 0755    CBG (last 3)  No results found for this basename: GLUCAP:3 in the last 72 hours   Intake/Output Summary (Last 24 hours) at 02/29/12 1451 Last data filed at 02/29/12 0200  Gross per 24 hour  Intake      5 ml  Output    500 ml  Net   -495 ml   Last Bm - 11/9  Weight Status:   10/26 158 lb 8.2 oz 11/11 160 lb  Estimated needs:   1750-1950  calories 85-105g protein  Nutrition Dx:  Inadequate oral intake - improving  Goal: Pt to consume >75% of meals/supplements - not met consistently  Monitor: Weights, labs, intake   Levon Hedger MS, RD, LDN [phone] Pager [phone] After Hours Pager

## 2012-02-29 NOTE — Progress Notes (Signed)
Patient ID: MILO SOLANA, female   DOB: March 16, 1946, 66 y.o.   MRN: [id number] Patient ID: IZZABELLE BOULEY, female   DOB: 1945/12/06, 66 y.o.   MRN: [id number]  General Surgery - [facility] - Progress Note  Subjective: Continues to do well, no c/o offered this morning; "feels like she is getting stronger".  Objective: Vital signs in last 24 hours: Temp:  [98.3 F (36.8 C)-99.3 F (37.4 C)] 98.3 F (36.8 C) (11/11 0600) Pulse Rate:  [67-88] 67  (11/11 0600) Resp:  [18-20] 18  (11/11 0600) BP: (159-170)/(72-77) 159/77 mmHg (11/11 0600) SpO2:  [96 %-98 %] 96 % (11/11 0600) Weight:  [160 lb (72.576 kg)] 160 lb (72.576 kg) (11/11 0600) Last BM Date: 02/27/12  Intake/Output from previous day: 11/10 0701 - 11/11 0700 In: 125 [P.O.:120] Out: 900 [Urine:900]  General appearance: A/A/O no acute distress Chest: CTA bilaterally Cardiac: RRR, no M/R/G Abdomen: non distended or tender; wound vac in place and functioning. (no output recorded) Perc drain (22ml frank pus ) Extremities: PICC line in RU arm, + pulses bilaterally, no edema or tenderness, warm to touch. Labs: pending VSS, afebrile  Lab Results:   Basename 02/26/12 0830  WBC 10.6*  HGB 9.2*  HCT 27.9*  PLT 406*    No results found for this basename: NA:2,K:2,CL:2,CO2:2,GLUCOSE:2,BUN:2,CREATININE:2,CALCIUM:2 in the last 72 hours  Studies/Results: No results found.  Assessment / Plan:  Patient Active Problem List  Diagnosis  . 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 Perforated viscus  . Abdominal abscess  . Sepsis  . Acute blood loss anemia  . Pleural effusion, bilateral  . Atelectasis  Status post ileocolonic resection for Crohn's disease, post op abscess formation.  Will need follow up CT abdomen sometime this week with drain injection. (probably in next day or 2) Will discuss with Dr. Magnus Ivan.   Blenda Mounts [facility] Pager #  [phone]  02/29/2012

## 2012-02-29 NOTE — Progress Notes (Signed)
10 Days Post-Op  Subjective: Pt doing fairly well; states that she has had some intermittent visual disturbances in rt eye (blurring/? Double vision); this has occurred without glasses on. No sig abd pain, nausea/vomiting.  Objective: Vital signs in last 24 hours: Temp:  [98.3 F (36.8 C)-99.3 F (37.4 C)] 98.3 F (36.8 C) (11/11 0600) Pulse Rate:  [67-88] 67  (11/11 0600) Resp:  [18-20] 18  (11/11 0600) BP: (159-170)/(72-77) 159/77 mmHg (11/11 0600) SpO2:  [96 %-98 %] 96 % (11/11 0600) Weight:  [160 lb (72.576 kg)] 160 lb (72.576 kg) (11/11 0600) Last BM Date: 02/27/12  Intake/Output from previous day: 11/10 0701 - 11/11 0700 In: 125 [P.O.:120] Out: 900 [Urine:900] Intake/Output this shift:    Rt RP drain intact, output last recorded was about 20 cc's 11/10; 20 - 30 cc's in bag now thick beige fluid. Cx's - klebsiella  Lab Results:   [facility] 02/29/12 0755  WBC 9.7  HGB 8.6*  HCT 26.4*  PLT 365   BMET  Basename 02/29/12 0755  NA 132*  K 4.0  CL 104  CO2 22  GLUCOSE 111*  BUN 6  CREATININE 0.66  CALCIUM 7.7*   PT/INR No results found for this basename: LABPROT:2,INR:2 in the last 72 hours ABG No results found for this basename: PHART:2,PCO2:2,PO2:2,HCO3:2 in the last 72 hours Results for orders placed during the hospital encounter of 02/06/12  URINE CULTURE     Status: Normal   Collection Time   02/06/12  2:15 PM      Component Value Range Status Comment   Specimen Description URINE, CLEAN CATCH   Final    Special Requests NONE   Final    Culture  Setup Time 02/06/2012 19:08   Final    Colony Count 20,OOO COLONIES/ML   Final    Culture     Final    Value: Multiple bacterial morphotypes present, none predominant. Suggest appropriate recollection if clinically indicated.   Report Status 02/07/2012 FINAL   Final   MRSA PCR SCREENING     Status: Normal   Collection Time   02/07/12 12:42 AM      Component Value Range Status Comment   MRSA by PCR NEGATIVE   NEGATIVE Final   CULTURE, ROUTINE-ABSCESS     Status: Normal   Collection Time   02/07/12 10:22 AM      Component Value Range Status Comment   Specimen Description DRAINAGE   Final    Special Requests Normal   Final    Gram Stain     Final    Value: ABUNDANT WBC PRESENT, PREDOMINANTLY PMN     NO SQUAMOUS EPITHELIAL CELLS SEEN     NO ORGANISMS SEEN   Culture NO GROWTH 3 DAYS   Final    Report Status 02/10/2012 FINAL   Final   BODY FLUID CULTURE     Status: Normal   Collection Time   02/12/12  9:53 AM      Component Value Range Status Comment   Specimen Description PLEURAL RIGHT   Final    Special Requests Normal   Final    Gram Stain     Final    Value: FEW WBC PRESENT, PREDOMINANTLY PMN     NO ORGANISMS SEEN   Culture NO GROWTH 3 DAYS   Final    Report Status 02/16/2012 FINAL   Final   CLOSTRIDIUM DIFFICILE BY PCR     Status: Normal   Collection Time   02/14/12 10:00  AM      Component Value Range Status Comment   C difficile by pcr NEGATIVE  NEGATIVE Final   CULTURE, ROUTINE-ABSCESS     Status: Normal   Collection Time   02/23/12 12:38 PM      Component Value Range Status Comment   Specimen Description PERITONEAL CAVITY   Final    Special Requests NONE   Final    Gram Stain     Final    Value: MODERATE WBC PRESENT,BOTH PMN AND MONONUCLEAR     NO SQUAMOUS EPITHELIAL CELLS SEEN     FEW GRAM NEGATIVE RODS     FEW YEAST   Culture     Final    Value: FEW KLEBSIELLA PNEUMONIAE     FEW YEAST CONSISTENT WITH CANDIDA SPECIES   Report Status 02/26/2012 FINAL   Final    Organism ID, Bacteria KLEBSIELLA PNEUMONIAE   Final     Studies/Results: No results found.  Anti-infectives: Anti-infectives     Start     Dose/Rate Route Frequency Ordered Stop   02/28/12 1200   cefTRIAXone (ROCEPHIN) 1 g in dextrose 5 % 50 mL IVPB        1 g 100 mL/hr over 30 Minutes Intravenous Every 24 hours 02/27/12 1140     02/27/12 1400   metroNIDAZOLE (FLAGYL) IVPB 500 mg        500 mg 100 mL/hr  over 60 Minutes Intravenous Every 8 hours 02/27/12 1140     02/27/12 1300   cefTRIAXone (ROCEPHIN) 1 g in dextrose 5 % 50 mL IVPB        1 g 100 mL/hr over 30 Minutes Intravenous  Once 02/27/12 1140 02/27/12 1430   02/24/12 0100   piperacillin-tazobactam (ZOSYN) IVPB 3.375 g  Status:  Discontinued        3.375 g 12.5 mL/hr over 240 Minutes Intravenous 3 times per day 02/23/12 1807 02/27/12 1138   02/20/12 1200   piperacillin-tazobactam (ZOSYN) IVPB 3.375 g  Status:  Discontinued        3.375 g 12.5 mL/hr over 240 Minutes Intravenous 4 times per day 02/20/12 1010 02/23/12 1807   02/14/12 1000   metroNIDAZOLE (FLAGYL) IVPB 500 mg  Status:  Discontinued        500 mg 100 mL/hr over 60 Minutes Intravenous Every 6 hours 02/14/12 0924 02/16/12 1043   02/08/12 1400   ceFEPIme (MAXIPIME) 1 g in dextrose 5 % 50 mL IVPB  Status:  Discontinued        1 g 100 mL/hr over 30 Minutes Intravenous 3 times per day 02/08/12 1137 02/08/12 1141   02/08/12 1400   piperacillin-tazobactam (ZOSYN) IVPB 3.375 g  Status:  Discontinued        3.375 g 12.5 mL/hr over 240 Minutes Intravenous Every 8 hours 02/08/12 1145 02/18/12 0721   02/08/12 1300   levofloxacin (LEVAQUIN) IVPB 750 mg  Status:  Discontinued        750 mg 100 mL/hr over 90 Minutes Intravenous Every 24 hours 02/08/12 1137 02/08/12 1141   02/06/12 1800   vancomycin (VANCOCIN) IVPB 1000 mg/200 mL premix  Status:  Discontinued        1,000 mg 200 mL/hr over 60 Minutes Intravenous Every 12 hours 02/06/12 1448 02/08/12 1133   02/06/12 1445   ceFEPIme (MAXIPIME) 1 g in dextrose 5 % 50 mL IVPB  Status:  Discontinued        1 g 100 mL/hr over 30 Minutes Intravenous 3 times  per day 02/06/12 1431 02/08/12 1137   02/06/12 1445   levofloxacin (LEVAQUIN) IVPB 750 mg  Status:  Discontinued        750 mg 100 mL/hr over 90 Minutes Intravenous Every 24 hours 02/06/12 1431 02/08/12 1137          Assessment/Plan: s/p right RP abscess drainage 11/5;  check f/u CT with drain injection next 1-2 days; monitor vision changes with glasses on/saline drops to eyes prn; incent spirometry   LOS: 23 days    Kiah Vanalstine,D [facility] 02/29/2012

## 2012-02-29 NOTE — Progress Notes (Signed)
Physical Therapy Treatment Patient Details Name: CIERRIA HEIGHT MRN: [id number] DOB: 01-Feb-1946 Today's Date: 02/29/2012 Time: 4098-1191 PT Time Calculation (min): 23 min  PT Assessment / Plan / Recommendation Comments on Treatment Session       Follow Up Recommendations  Home health PT;Supervision/Assistance - 24 hour     Does the patient have the potential to tolerate intense rehabilitation     Barriers to Discharge        Equipment Recommendations  Rolling walker with 5" wheels;3 in 1 bedside comode    Recommendations for Other Services    Frequency Min 3X/week   Plan Discharge plan remains appropriate    Precautions / Restrictions Precautions Precautions: Fall Precaution Comments: drain R flank, wound vac Restrictions Weight Bearing Restrictions: No   Pertinent Vitals/Pain Pt denies    Mobility  Bed Mobility Bed Mobility: Not assessed Transfers Transfers: Sit to Stand;Stand to Sit Sit to Stand: 5: Supervision;From chair/3-in-1;With armrests Stand to Sit: 5: Supervision;To chair/3-in-1;With armrests Stand Pivot Transfers: 4: Min guard Details for Transfer Assistance: VCs safety, hand placement. Stand pivot, recliner > bsc, without AD Ambulation/Gait Ambulation/Gait Assistance: 4: Min guard Ambulation Distance (Feet): 100 Feet Assistive device: Rolling walker Ambulation/Gait Assistance Details: slow gait speed.  Gait Pattern: Decreased stride length;Decreased step length - right;Decreased step length - left    Exercises General Exercises - Lower Extremity Ankle Circles/Pumps: AROM;Both;15 reps;Seated Quad Sets: AROM;Both;Seated Short Arc Quad: AROM;Both;Seated   PT Diagnosis:    PT Problem List:   PT Treatment Interventions:     PT Goals Acute Rehab PT Goals Pt will go Sit to Stand: with modified independence PT Goal: Sit to Stand - Progress: Progressing toward goal Pt will go Stand to Sit: with modified independence PT Goal: Stand to Sit -  Progress: Progressing toward goal Pt will Ambulate: 51 - 150 feet;with modified independence;with least restrictive assistive device PT Goal: Ambulate - Progress: Progressing toward goal  Visit Information  Last PT Received On: 02/29/12 Assistance Needed: +1    Subjective Data  Subjective: "I may get to go home this week" Patient Stated Goal: Home   Cognition  Overall Cognitive Status: Appears within functional limits for tasks assessed/performed Arousal/Alertness: Awake/alert Orientation Level: Appears intact for tasks assessed Behavior During Session: [facility] for tasks performed    Balance     End of Session PT - End of Session Activity Tolerance: Patient tolerated treatment well Patient left: in chair;with call bell/phone within reach   GP     [name] [facility] 02/29/2012, 11:34 AM [phone]

## 2012-03-01 ENCOUNTER — Inpatient Hospital Stay (HOSPITAL_COMMUNITY): Payer: Medicare Other

## 2012-03-01 LAB — MAGNESIUM: Magnesium: 1.6 mg/dL (ref 1.5–2.5)

## 2012-03-01 MED ORDER — IOHEXOL 300 MG/ML  SOLN
100.0000 mL | Freq: Once | INTRAMUSCULAR | Status: AC | PRN
  Administered 2012-03-01: 100 mL via INTRAVENOUS

## 2012-03-01 MED ORDER — CALCIUM CARBONATE ANTACID 500 MG PO CHEW
2.0000 | CHEWABLE_TABLET | Freq: Three times a day (TID) | ORAL | Status: DC
  Administered 2012-03-01 – 2012-03-07 (×18): 400 mg via ORAL
  Filled 2012-03-01 (×25): qty 2

## 2012-03-01 MED ORDER — HEPARIN SODIUM (PORCINE) 5000 UNIT/ML IJ SOLN
5000.0000 [IU] | Freq: Three times a day (TID) | INTRAMUSCULAR | Status: DC
  Administered 2012-03-02 – 2012-03-07 (×14): 5000 [IU] via SUBCUTANEOUS
  Filled 2012-03-01 (×18): qty 1

## 2012-03-01 NOTE — Progress Notes (Signed)
11 Days Post-Op  Subjective: Pt without acute changes  Objective: Vital signs in last 24 hours: Temp:  [97.8 F (36.6 C)-98.3 F (36.8 C)] 98.1 F (36.7 C) (11/12 1417) Pulse Rate:  [64-87] 87  (11/12 1417) Resp:  [18-20] 20  (11/12 1417) BP: (153-163)/(69-83) 163/83 mmHg (11/12 1417) SpO2:  [95 %-99 %] 99 % (11/12 1417) Last BM Date: 02/23/12  Intake/Output from previous day: 11/11 0701 - 11/12 0700 In: 600 [I.V.:600] Out: 1085 [Urine:1050; Drains:35] Intake/Output this shift:    Right  RP drain intact, output 35 cc's, insertion site mildly tender   Lab Results:   [employer] [facility] 02/29/12 0755  WBC 9.7  HGB 8.6*  HCT 26.4*  PLT 365   BMET  Basename 02/29/12 0755  NA 132*  K 4.0  CL 104  CO2 22  GLUCOSE 111*  BUN 6  CREATININE 0.66  CALCIUM 7.7*   PT/INR No results found for this basename: LABPROT:2,INR:2 in the last 72 hours ABG No results found for this basename: PHART:2,PCO2:2,PO2:2,HCO3:2 in the last 72 hours  Studies/Results: Ct Abdomen Pelvis W Contrast  03/01/2012  *RADIOLOGY REPORT*  Clinical Data: Follow-up abdominal and pelvic abscess. Abdominal pain.  Status post bowel resection for Crohn's disease.  CT ABDOMEN AND PELVIS WITH CONTRAST  Technique:  Multidetector CT imaging of the abdomen and pelvis was performed following the standard protocol during bolus administration of intravenous contrast.  Contrast: OMNIPAQUE IOHEXOL 300 MG/ML  SOLN  Comparison: 02/22/2012  Findings: A right posterior percutaneous drainage catheter is now seen in appropriate position in the complex collection within the right retroperitoneum. The component of this collection within the right anterior pararenal space has nearly completely resolved, while the component in the posterior pararenal space has only slightly decreased in size, now measuring 6.6 x 6.9 cm compared to 7.1 x 7.3 cm previously.  A small amount of complex fluid is again seen in the right perihepatic space  which is unchanged.  No new or enlarging fluid collections are identified.  The abdominal parenchymal organs remain unremarkable in appearance. Gallbladder is unremarkable.  No evidence of hydronephrosis.  No soft tissue masses or lymphadenopathy identified. Postoperative changes from partial right ileocolectomy are stable.  No evidence of dilated bowel loops or hernia.  Bilateral pleural effusions are again seen, right side larger than left, without significant change.  Associated bibasilar atelectasis again demonstrated.  IMPRESSION:  1. Overall decrease in size of complex right retroperitoneal fluid collection, although there is a persistent component posteriorly measuring 6.9 cm which is only minimally decreased in size. 2.  Stable small complex fluid collection throughout the right perihepatic space. 3.  No significant change in bilateral pleural effusions and bibasilar atelectasis.   Original Report Authenticated By: Myles Rosenthal, M.D.     Anti-infectives: Anti-infectives     Start     Dose/Rate Route Frequency Ordered Stop   02/28/12 1200   cefTRIAXone (ROCEPHIN) 1 g in dextrose 5 % 50 mL IVPB        1 g 100 mL/hr over 30 Minutes Intravenous Every 24 hours 02/27/12 1140     02/27/12 1400   metroNIDAZOLE (FLAGYL) IVPB 500 mg        500 mg 100 mL/hr over 60 Minutes Intravenous Every 8 hours 02/27/12 1140     02/27/12 1300   cefTRIAXone (ROCEPHIN) 1 g in dextrose 5 % 50 mL IVPB        1 g 100 mL/hr over 30 Minutes Intravenous  Once 02/27/12 1140  02/27/12 1430   02/24/12 0100   piperacillin-tazobactam (ZOSYN) IVPB 3.375 g  Status:  Discontinued        3.375 g 12.5 mL/hr over 240 Minutes Intravenous 3 times per day 02/23/12 1807 02/27/12 1138   02/20/12 1200   piperacillin-tazobactam (ZOSYN) IVPB 3.375 g  Status:  Discontinued        3.375 g 12.5 mL/hr over 240 Minutes Intravenous 4 times per day 02/20/12 1010 02/23/12 1807   02/14/12 1000   metroNIDAZOLE (FLAGYL) IVPB 500 mg  Status:   Discontinued        500 mg 100 mL/hr over 60 Minutes Intravenous Every 6 hours 02/14/12 0924 02/16/12 1043   02/08/12 1400   ceFEPIme (MAXIPIME) 1 g in dextrose 5 % 50 mL IVPB  Status:  Discontinued        1 g 100 mL/hr over 30 Minutes Intravenous 3 times per day 02/08/12 1137 02/08/12 1141   02/08/12 1400   piperacillin-tazobactam (ZOSYN) IVPB 3.375 g  Status:  Discontinued        3.375 g 12.5 mL/hr over 240 Minutes Intravenous Every 8 hours 02/08/12 1145 02/18/12 0721   02/08/12 1300   levofloxacin (LEVAQUIN) IVPB 750 mg  Status:  Discontinued        750 mg 100 mL/hr over 90 Minutes Intravenous Every 24 hours 02/08/12 1137 02/08/12 1141   02/06/12 1800   vancomycin (VANCOCIN) IVPB 1000 mg/200 mL premix  Status:  Discontinued        1,000 mg 200 mL/hr over 60 Minutes Intravenous Every 12 hours 02/06/12 1448 02/08/12 1133   02/06/12 1445   ceFEPIme (MAXIPIME) 1 g in dextrose 5 % 50 mL IVPB  Status:  Discontinued        1 g 100 mL/hr over 30 Minutes Intravenous 3 times per day 02/06/12 1431 02/08/12 1137   02/06/12 1445   levofloxacin (LEVAQUIN) IVPB 750 mg  Status:  Discontinued        750 mg 100 mL/hr over 90 Minutes Intravenous Every 24 hours 02/06/12 1431 02/08/12 1137          Assessment/Plan: s/p right RP abscess drainage 11/5; CT today reveals decreased but unresolved abscess collection with sig portion posterior to existing drain. Will plan to reposition/exchange drain on 11/13. Above d/w pt with her understanding and consent.   LOS: 24 days    Dalana Pfahler,D [facility] 03/01/2012

## 2012-03-01 NOTE — Progress Notes (Signed)
Patient ID: Michele David, female   DOB: 19-Jan-1946, 66 y.o.   MRN: [id number] Patient ID: Michele David, female   DOB: March 11, 1946, 66 y.o.   MRN: [id number] Patient ID: Michele David, female   DOB: 03/30/1946, 66 y.o.   MRN: [id number]  General Surgery - [facility] - Progress Note  Subjective: Continues to do well, no c/o offered this morning  Objective: Vital signs in last 24 hours: Temp:  [97.8 F (36.6 C)-98.4 F (36.9 C)] 97.8 F (36.6 C) (11/12 0600) Pulse Rate:  [64-79] 64  (11/12 0600) Resp:  [18-20] 18  (11/12 0600) BP: (153-157)/(69-73) 153/70 mmHg (11/12 0600) SpO2:  [95 %-99 %] 95 % (11/12 0600) Last BM Date: 02/23/12  Intake/Output from previous day: 11/11 0701 - 11/12 0700 In: 600 [I.V.:600] Out: 1085 [Urine:1050; Drains:35]  General appearance: A/A/O no acute distress Chest: CTA bilaterally Cardiac: RRR, no M/R/G Abdomen: non distended or tender; wound vac in place and functioning. (no output recorded) Perc drain (35ml frank pus ) Extremities: PICC line in RU arm, + pulses bilaterally, no edema or tenderness, warm to touch. VSS, afebrile Labs: hyponatremic (asymptomatic) and hypocalcemic Wbc wnl, H&H had trended down slightly   Lab Results:   [facility] 02/29/12 0755  WBC 9.7  HGB 8.6*  HCT 26.4*  PLT 365     Basename 02/29/12 0755  NA 132*  K 4.0  CL 104  CO2 22  GLUCOSE 111*  BUN 6  CREATININE 0.66  CALCIUM 7.7*    Studies/Results: No results found.  Assessment / Plan:  Patient Active Problem List  Diagnosis  . 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 Perforated viscus  . Abdominal abscess  . Sepsis  . Acute blood loss anemia  . Pleural effusion, bilateral  . Atelectasis  Status post ileocolonic resection for Crohn's disease, post op abscess formation.  CT abdomen pelvis today with drain injection.  Recheck CBC in am Replete calcium Continue with IV abx for now  Continue to  follow clinical picture    Blenda Mounts [facility] Surgery Pager # [phone]  03/01/2012

## 2012-03-01 NOTE — Progress Notes (Signed)
I have seen and examined the patient and agree with the assessment and plans.  Maizie Garno A. Haywood Meinders  MD, FACS  

## 2012-03-01 NOTE — Progress Notes (Signed)
Patient right lower back drain dressing was soaked in milky white drainage, changed bandage, patient resting, but stated wasn't comfortable, only relief was to go home. Will continue to monitor

## 2012-03-02 ENCOUNTER — Encounter (INDEPENDENT_AMBULATORY_CARE_PROVIDER_SITE_OTHER): Payer: Medicare Other | Admitting: Surgery

## 2012-03-02 ENCOUNTER — Inpatient Hospital Stay (HOSPITAL_COMMUNITY): Payer: Medicare Other

## 2012-03-02 LAB — PROTIME-INR: INR: 1.21 (ref 0.00–1.49)

## 2012-03-02 LAB — BASIC METABOLIC PANEL
Chloride: 106 mEq/L (ref 96–112)
Creatinine, Ser: 0.73 mg/dL (ref 0.50–1.10)
GFR calc Af Amer: >90 mL/min (ref >90)

## 2012-03-02 LAB — CBC
HCT: 26.4 % — ABNORMAL LOW (ref 36.0–46.0)
Platelets: 415 10*3/uL — ABNORMAL HIGH (ref 150–400)
RDW: 18.1 % — ABNORMAL HIGH (ref 11.5–15.5)
WBC: 7.8 10*3/uL (ref 4.0–10.5)

## 2012-03-02 LAB — APTT: aPTT: 44 seconds — ABNORMAL HIGH (ref 24–37)

## 2012-03-02 MED ORDER — MAGNESIUM SULFATE 40 MG/ML IJ SOLN
2.0000 g | Freq: Once | INTRAMUSCULAR | Status: DC
  Filled 2012-03-02: qty 50

## 2012-03-02 NOTE — Progress Notes (Signed)
Patient ID: Michele David, female   DOB: Dec 20, 1945, 66 y.o.   MRN: [id number] Patient ID: Michele David, female   DOB: 11-09-1945, 66 y.o.   MRN: [id number] Patient ID: Michele David, female   DOB: 1945/07/28, 66 y.o.   MRN: [id number] Patient ID: Michele David, female   DOB: Sep 08, 1945, 66 y.o.   MRN: [id number]  General Surgery - [facility] - Progress Note  Subjective: Continues to do well, no c/o offered this morning. IR procedure planned for repositioning of drain this afternoon.  Objective: Vital signs in last 24 hours: Temp:  [98 F (36.7 C)-98.1 F (36.7 C)] 98 F (36.7 C) (11/13 0630) Pulse Rate:  [66-87] 66  (11/13 0630) Resp:  [18-20] 18  (11/13 0630) BP: (151-171)/(70-83) 171/70 mmHg (11/13 0630) SpO2:  [96 %-99 %] 96 % (11/13 0630) Last BM Date: 02/23/12  Intake/Output from previous day:    General appearance: A/A/O no acute distress Chest: CTA bilaterally Cardiac: RRR, no M/R/G Abdomen: non distended or tender; wound vac in place and functioning. (no output recorded) Perc drain (35ml ) Extremities: PICC line in RU arm, + pulses bilaterally, no edema or tenderness, warm to touch. VSS, afebrile Labs: hypocalcemic (improving) Wbc wnl, H&H stable. Creatine has trended up slightly.  Lab Results:   [facility] 03/02/12 0349 02/29/12 0755  WBC 7.8 9.7  HGB 8.7* 8.6*  HCT 26.4* 26.4*  PLT 415* 365     Basename 03/02/12 0349 02/29/12 0755  NA 136 132*  K 4.1 4.0  CL 106 104  CO2 23 22  GLUCOSE 113* 111*  BUN 5* 6  CREATININE 0.73 0.66  CALCIUM 7.9* 7.7*    Studies/Results: Ct Abdomen Pelvis W Contrast  03/01/2012  *RADIOLOGY REPORT*  Clinical Data: Follow-up abdominal and pelvic abscess. Abdominal pain.  Status post bowel resection for Crohn's disease.  CT ABDOMEN AND PELVIS WITH CONTRAST  Technique:  Multidetector CT imaging of the abdomen and pelvis was performed following the standard protocol during bolus  administration of intravenous contrast.  Contrast: OMNIPAQUE IOHEXOL 300 MG/ML  SOLN  Comparison: 02/22/2012  Findings: A right posterior percutaneous drainage catheter is now seen in appropriate position in the complex collection within the right retroperitoneum. The component of this collection within the right anterior pararenal space has nearly completely resolved, while the component in the posterior pararenal space has only slightly decreased in size, now measuring 6.6 x 6.9 cm compared to 7.1 x 7.3 cm previously.  A small amount of complex fluid is again seen in the right perihepatic space which is unchanged.  No new or enlarging fluid collections are identified.  The abdominal parenchymal organs remain unremarkable in appearance. Gallbladder is unremarkable.  No evidence of hydronephrosis.  No soft tissue masses or lymphadenopathy identified. Postoperative changes from partial right ileocolectomy are stable.  No evidence of dilated bowel loops or hernia.  Bilateral pleural effusions are again seen, right side larger than left, without significant change.  Associated bibasilar atelectasis again demonstrated.  IMPRESSION:  1. Overall decrease in size of complex right retroperitoneal fluid collection, although there is a persistent component posteriorly measuring 6.9 cm which is only minimally decreased in size. 2.  Stable small complex fluid collection throughout the right perihepatic space. 3.  No significant change in bilateral pleural effusions and bibasilar atelectasis.   Original Report Authenticated By: Myles Rosenthal, M.D.     Assessment / Plan:  Patient Active Problem List  Diagnosis  . Crohn's disease of  small intestine with complication  . Perforated viscus  . Abdominal abscess  . Sepsis  . Acute blood loss anemia  . Pleural effusion, bilateral  . Atelectasis  Status post ileocolonic resection for Crohn's disease, post op abscess formation.  CT abdomen pelvis today with drain  injection.  Recheck CBC in am Replete calcium Continue with IV abx for now  Continue to follow clinical picture Await IR's input post procedure.    Blenda Mounts [facility] Surgery Pager # [phone]  03/02/2012

## 2012-03-02 NOTE — Consult Note (Signed)
WOC follow-up consult Note: Vac dressing changed.  Pt medicated prior to procedure and tolerated with mod discomfort.  Inner abd wound 100% beefy red. 16X2.5X3.5cm  Mod yellow drainage in cannister, no odor.  Applied Mepitel and one piece white foam to inner wound bed and one piece black foam over this to cont suction. Plan dressing change on Friday.   Cammie Mcgee, RN, MSN, [employer]  [phone]

## 2012-03-02 NOTE — Progress Notes (Signed)
I have seen and examined the patient and agree with the assessment and plans.  Brant Peets A. Jemina Scahill  MD, FACS  

## 2012-03-02 NOTE — Progress Notes (Signed)
Provided support with pt and daughter at bedside.   Support around length of stay, procedure today.  Pt hopeful and in good spirits today.  Spoke with chaplain about motivation waxing and waning Will continue to follow up for support.    Belva Crome MDiv, Chaplain

## 2012-03-02 NOTE — Progress Notes (Signed)
PT Cancellation Note  _X_Treatment cancelled today due to medical issues with patient which prohibited therapy..........just came back from radiology for drain tube repositioning and in a lot of pain  ___ Treatment cancelled today due to patient receiving procedure or test   ___ Treatment cancelled today due to patient's refusal to participate   ___ Treatment cancelled today due to  Felecia Shelling  PTA [facility]  Acute  Rehab Pager     [phone]

## 2012-03-03 MED ORDER — DEXTROSE 5 % IV SOLN
1.0000 g | INTRAVENOUS | Status: DC
  Administered 2012-03-03: 1 g via INTRAVENOUS
  Filled 2012-03-03 (×2): qty 10

## 2012-03-03 MED ORDER — METRONIDAZOLE 500 MG PO TABS
500.0000 mg | ORAL_TABLET | Freq: Three times a day (TID) | ORAL | Status: DC
  Administered 2012-03-03 – 2012-03-07 (×13): 500 mg via ORAL
  Filled 2012-03-03 (×17): qty 1

## 2012-03-03 NOTE — Progress Notes (Signed)
Physical Therapy Treatment Patient Details Name: Michele David MRN: [id number] DOB: 28-Aug-1945 Today's Date: 03/03/2012 Time: 4098-1191 PT Time Calculation (min): 13 min  PT Assessment / Plan / Recommendation Comments on Treatment Session  Drain repositioned 11/14. Encouraged pt to ambulate to bathroom instead of using BSC (unless it's an emergency) in order to increase activity/ambulation. Pt states MD told her to use commode. Also encouraged pt to perform LE exercises when able, especially since she spends so much time in the bed. Plan is stil for home. Will need HHPT to improve strength and actitivty tolerance.     Follow Up Recommendations  Home health PT;Supervision/Assistance - 24 hour(refusing SNF)     Does the patient have the potential to tolerate intense rehabilitation     Barriers to Discharge        Equipment Recommendations  Rolling walker with 5" wheels;3 in 1 bedside comode    Recommendations for Other Services    Frequency Min 3X/week   Plan Discharge plan remains appropriate    Precautions / Restrictions Precautions Precautions: Fall Precaution Comments: drain R flank, wound vac Restrictions Weight Bearing Restrictions: No   Pertinent Vitals/Pain No c/o    Mobility  Bed Mobility Bed Mobility: Supine to Sit;Sit to Supine Supine to Sit: HOB elevated;With rails;5: Supervision Sit to Supine: HOB elevated;With rail;5: Supervision Transfers Transfers: Sit to Stand;Stand to Sit Sit to Stand: 5: Supervision;From bed Stand to Sit: 5: Supervision;To bed Ambulation/Gait Ambulation/Gait Assistance: 4: Min guard Ambulation Distance (Feet): 120 Feet Assistive device: Rolling walker Ambulation/Gait Assistance Details: Slow gait speed.  Gait Pattern: Decreased stride length;Decreased step length - left;Decreased step length - right;Trunk flexed    Exercises     PT Diagnosis:    PT Problem List:   PT Treatment Interventions:     PT Goals Acute Rehab PT  Goals PT Goal Formulation: With patient Time For Goal Achievement: 03/17/12 Potential to Achieve Goals: Good Pt will go Supine/Side to Sit: Independently PT Goal: Supine/Side to Sit - Progress: Progressing toward goal Pt will go Sit to Supine/Side: Independently PT Goal: Sit to Supine/Side - Progress: Progressing toward goal Pt will go Sit to Stand: with modified independence PT Goal: Sit to Stand - Progress: Progressing toward goal Pt will go Stand to Sit: with modified independence PT Goal: Stand to Sit - Progress: Progressing toward goal Pt will Ambulate: 51 - 150 feet;with modified independence;with least restrictive assistive device PT Goal: Ambulate - Progress: Progressing toward goal  Visit Information  Last PT Received On: 03/03/12 Assistance Needed: +1    Subjective Data  Subjective: "I might go home Monday" Patient Stated Goal: Drain this fluid. That's the most important thing.    Cognition  Overall Cognitive Status: Appears within functional limits for tasks assessed/performed Arousal/Alertness: Awake/alert Orientation Level: Appears intact for tasks assessed Behavior During Session: [facility] for tasks performed    Balance     End of Session PT - End of Session Activity Tolerance: Patient tolerated treatment well Patient left: in bed;with call bell/phone within reach   GP     [name] [facility] 03/03/2012, 3:10 PM [phone]

## 2012-03-03 NOTE — Progress Notes (Signed)
PT NOTE  Attempted PT tx session. Pt declined to participate at this time. Requested PT check back later today. Will check back as schedule permits. Thanks. [name], PT [phone]

## 2012-03-03 NOTE — Progress Notes (Signed)
Patient ID: LAVAUN GREENFIELD, female   DOB: [birth year], 66 y.o.   MRN: [id number] Patient ID: SHRINIKA BLATZ, female   DOB: Sep 01, 1945, 66 y.o.   MRN: [id number] Patient ID: AHNNA DUNGAN, female   DOB: 01/15/1946, 66 y.o.   MRN: [id number] Patient ID: CHAYLEE EHRSAM, female   DOB: Sep 22, 1945, 66 y.o.   MRN: [id number] Patient ID: JEZLYN WESTERFIELD, female   DOB: 03/14/1946, 66 y.o.   MRN: [id number]  General Surgery - [facility] - Progress Note  Subjective: Continues to do well, no c/o offered this morning. IR has repositioned drain ad it appears to be draining well.  Objective: Vital signs in last 24 hours: Temp:  [97.7 F (36.5 C)-98.4 F (36.9 C)] 97.7 F (36.5 C) (11/14 0549) Pulse Rate:  [58-76] 67  (11/14 0549) Resp:  [18] 18  (11/14 0549) BP: (159-168)/(66-78) 168/78 mmHg (11/14 0549) SpO2:  [95 %-98 %] 98 % (11/14 0549) Last BM Date: 02/23/12  Intake/Output from previous day: 11/13 0701 - 11/14 0700 In: -  Out: 100 [Drains:100]  General appearance: A/A/O no acute distress Chest: CTA bilaterally Cardiac: RRR, no M/R/G Abdomen: non distended or tender; wound vac in place and functioning. (no output recorded) Perc drain ( ) Extremities: PICC line in RU arm, + pulses bilaterally, no edema or tenderness, warm to touch. VSS, afebrile Labs: hypocalcemic (improving) Wbc wnl, H&H stable. Creatine has trended up slightly.  Lab Results:   [facility] 03/02/12 0349 02/29/12 0755  WBC 7.8 9.7  HGB 8.7* 8.6*  HCT 26.4* 26.4*  PLT 415* 365     Basename 03/02/12 0349 02/29/12 0755  NA 136 132*  K 4.1 4.0  CL 106 104  CO2 23 22  GLUCOSE 113* 111*  BUN 5* 6  CREATININE 0.73 0.66  CALCIUM 7.9* 7.7*    Studies/Results: Ct Abdomen Pelvis W Contrast  03/01/2012  *RADIOLOGY REPORT*  Clinical Data: Follow-up abdominal and pelvic abscess. Abdominal pain.  Status post bowel resection for Crohn's disease.  CT ABDOMEN AND PELVIS WITH  CONTRAST  Technique:  Multidetector CT imaging of the abdomen and pelvis was performed following the standard protocol during bolus administration of intravenous contrast.  Contrast: OMNIPAQUE IOHEXOL 300 MG/ML  SOLN  Comparison: 02/22/2012  Findings: A right posterior percutaneous drainage catheter is now seen in appropriate position in the complex collection within the right retroperitoneum. The component of this collection within the right anterior pararenal space has nearly completely resolved, while the component in the posterior pararenal space has only slightly decreased in size, now measuring 6.6 x 6.9 cm compared to 7.1 x 7.3 cm previously.  A small amount of complex fluid is again seen in the right perihepatic space which is unchanged.  No new or enlarging fluid collections are identified.  The abdominal parenchymal organs remain unremarkable in appearance. Gallbladder is unremarkable.  No evidence of hydronephrosis.  No soft tissue masses or lymphadenopathy identified. Postoperative changes from partial right ileocolectomy are stable.  No evidence of dilated bowel loops or hernia.  Bilateral pleural effusions are again seen, right side larger than left, without significant change.  Associated bibasilar atelectasis again demonstrated.  IMPRESSION:  1. Overall decrease in size of complex right retroperitoneal fluid collection, although there is a persistent component posteriorly measuring 6.9 cm which is only minimally decreased in size. 2.  Stable small complex fluid collection throughout the right perihepatic space. 3.  No significant change in bilateral pleural effusions and bibasilar atelectasis.  Original Report Authenticated By: Myles Rosenthal, M.D.    Ct Abd Limited W/o Cm  03/02/2012  *RADIOLOGY REPORT*  Clinical Data: Right lower quadrant abscess  CT ABDOMEN LIMITED WITHOUT CONTRAST  Technique: Noncontrast imaging through the abdomen was performed.  Comparison:  Yesterday.  Findings:  Noncontrast imaging through the abdomen confirms positioning of the abscess drain within the right retroperitoneal fluid collection.  The abscess drain was retracted inferiorly into the larger pocket. It was resutured to the skin at the stat lock device.  Repeat imaging confirms that the tip of the pigtail drain is now positioned within the inferior pocket of fluid.  IMPRESSION: Successful repositioning of the retroperitoneal abscess drain.  The tip is now positioned within the larger more inferior pocket of fluid.   Original Report Authenticated By: Jolaine Click, M.D.     Assessment / Plan:  Patient Active Problem List  Diagnosis  . 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 Perforated viscus  . Abdominal abscess  . Sepsis  . Acute blood loss anemia  . Pleural effusion, bilateral  . Atelectasis  Status post ileocolonic resection for Crohn's disease, post op abscess formation.   Continue with IV rocephin, will convert flagyl to po Continue to follow clinical picture Continue with wound vac (m,w,f) encourage OOB/ambulation .    Blenda Mounts [facility] Surgery Pager # [phone]  03/03/2012

## 2012-03-03 NOTE — Progress Notes (Signed)
I have seen and examined the patient and agree with the assessment and plans.  Elvera Almario A. Guido Comp  MD, FACS  

## 2012-03-03 NOTE — Progress Notes (Signed)
Follow up with pt for continued support.  Pt in good spirits, hoping to be able to be discharged soon.   Will continue to follow - please page as needs arise.

## 2012-03-03 NOTE — Progress Notes (Signed)
Subjective: Pt ok. Eating breakfast, good appetitie No new pain at drain site  Objective: Physical Exam: BP 168/78  Pulse 67  Temp 97.7 F (36.5 C) (Oral)  Resp 18  Ht 5\' 2"  (1.575 m)  Wt 160 lb (72.576 kg)  BMI 29.26 kg/m2  SpO2 98% Drain intact, site clean. Purulent output, improved since repositioning   Labs: CBC  Basename 03/02/12 0349  WBC 7.8  HGB 8.7*  HCT 26.4*  PLT 415*   BMET  Basename 03/02/12 0349  NA 136  K 4.1  CL 106  CO2 23  GLUCOSE 113*  BUN 5*  CREATININE 0.73  CALCIUM 7.9*   LFT No results found for this basename: PROT,ALBUMIN,AST,ALT,ALKPHOS,BILITOT,BILIDIR,IBILI,LIPASE in the last 72 hours PT/INR  Basename 03/02/12 0349  LABPROT 15.1  INR 1.21     Studies/Results: Ct Abdomen Pelvis W Contrast  03/01/2012  *RADIOLOGY REPORT*  Clinical Data: Follow-up abdominal and pelvic abscess. Abdominal pain.  Status post bowel resection for Crohn's disease.  CT ABDOMEN AND PELVIS WITH CONTRAST  Technique:  Multidetector CT imaging of the abdomen and pelvis was performed following the standard protocol during bolus administration of intravenous contrast.  Contrast: OMNIPAQUE IOHEXOL 300 MG/ML  SOLN  Comparison: 02/22/2012  Findings: A right posterior percutaneous drainage catheter is now seen in appropriate position in the complex collection within the right retroperitoneum. The component of this collection within the right anterior pararenal space has nearly completely resolved, while the component in the posterior pararenal space has only slightly decreased in size, now measuring 6.6 x 6.9 cm compared to 7.1 x 7.3 cm previously.  A small amount of complex fluid is again seen in the right perihepatic space which is unchanged.  No new or enlarging fluid collections are identified.  The abdominal parenchymal organs remain unremarkable in appearance. Gallbladder is unremarkable.  No evidence of hydronephrosis.  No soft tissue masses or lymphadenopathy  identified. Postoperative changes from partial right ileocolectomy are stable.  No evidence of dilated bowel loops or hernia.  Bilateral pleural effusions are again seen, right side larger than left, without significant change.  Associated bibasilar atelectasis again demonstrated.  IMPRESSION:  1. Overall decrease in size of complex right retroperitoneal fluid collection, although there is a persistent component posteriorly measuring 6.9 cm which is only minimally decreased in size. 2.  Stable small complex fluid collection throughout the right perihepatic space. 3.  No significant change in bilateral pleural effusions and bibasilar atelectasis.   Original Report Authenticated By: Myles Rosenthal, M.D.    Ct Abd Limited W/o Cm  03/02/2012  *RADIOLOGY REPORT*  Clinical Data: Right lower quadrant abscess  CT ABDOMEN LIMITED WITHOUT CONTRAST  Technique: Noncontrast imaging through the abdomen was performed.  Comparison:  Yesterday.  Findings: Noncontrast imaging through the abdomen confirms positioning of the abscess drain within the right retroperitoneal fluid collection.  The abscess drain was retracted inferiorly into the larger pocket. It was resutured to the skin at the stat lock device.  Repeat imaging confirms that the tip of the pigtail drain is now positioned within the inferior pocket of fluid.  IMPRESSION: Successful repositioning of the retroperitoneal abscess drain.  The tip is now positioned within the larger more inferior pocket of fluid.   Original Report Authenticated By: Jolaine Click, M.D.     Assessment/Plan: s/p right RP abscess drainage 11/5; Repositioned 11/14 with improved output COnt to follow    LOS: 26 days    Brayton El PA-C 03/03/2012 8:49 AM

## 2012-03-04 LAB — BASIC METABOLIC PANEL
Calcium: 8.5 mg/dL (ref 8.4–10.5)
GFR calc Af Amer: >90 mL/min (ref >90)
GFR calc non Af Amer: 89 mL/min — ABNORMAL LOW (ref >90)
Sodium: 129 mEq/L — ABNORMAL LOW (ref 135–145)

## 2012-03-04 LAB — CBC
MCH: 26.3 pg (ref 26.0–34.0)
MCHC: 32.7 g/dL (ref 30.0–36.0)
Platelets: 508 10*3/uL — ABNORMAL HIGH (ref 150–400)
RBC: 3.96 MIL/uL (ref 3.87–5.11)
RDW: 18.1 % — ABNORMAL HIGH (ref 11.5–15.5)

## 2012-03-04 MED ORDER — CIPROFLOXACIN HCL 500 MG PO TABS
500.0000 mg | ORAL_TABLET | Freq: Two times a day (BID) | ORAL | Status: DC
  Administered 2012-03-04 – 2012-03-07 (×7): 500 mg via ORAL
  Filled 2012-03-04 (×9): qty 1

## 2012-03-04 NOTE — Consult Note (Signed)
WOC consult Note Wound VAC changed.  Measurements:  16 x 3 x 2.5cm I is perhaps no longer necessary for placement of Mepitel, white and black foam; suggest placement of black foam only with next dressing change.  Patient comfortable during procedure and wound dimensions are decreasing.  Wound bed pink, moist and granulating.Exudate decreasing. I will continue to follow while in house.  Discharge plans are in place in the event of discharge home. Thanks, Ladona Mow, MSN, RN, [facility], CWOCN [phone])

## 2012-03-04 NOTE — Progress Notes (Signed)
PT Cancellation Note  Patient Details Name: Michele David MRN: [id number] DOB: 01/03/1946   Cancelled Treatment:     Attempted PT tx session. Pt declined to participate at this time due to pain.    [name] [facility] 03/04/2012, 3:43 PM [phone]

## 2012-03-04 NOTE — Progress Notes (Signed)
I have seen and examined the patient and agree with the assessment and plans. Hopefully home soon with drain.  Jabree Rebert A. Magnus Ivan  MD, FACS

## 2012-03-04 NOTE — Progress Notes (Signed)
Patient ID: [name], female   DOB: 1945/11/19, 66 y.o.   MRN: [id number] Patient ID: Michele David, female   DOB: 05-13-1945, 66 y.o.   MRN: [id number] Patient ID: Michele David, female   DOB: [birth year], 66 y.o.   MRN: [id number] Patient ID: Michele David, female   DOB: Aug 21, 1945, 66 y.o.   MRN: [id number] Patient ID: Michele David, female   DOB: 1946-01-26, 66 y.o.   MRN: [id number] Patient ID: Michele David, female   DOB: Apr 20, 1946, 66 y.o.   MRN: [id number]  General Surgery - [facility] - Progress Note  Subjective: Continues to do well, no c/o offered this morning. IR has repositioned drain and it appears to be draining well.  Objective: Vital signs in last 24 hours: Temp:  [97.6 F (36.4 C)-98.5 F (36.9 C)] 97.6 F (36.4 C) (11/15 0525) Pulse Rate:  [67-79] 76  (11/15 0525) Resp:  [16-22] 20  (11/15 0525) BP: (162-178)/(72-84) 174/74 mmHg (11/15 0525) SpO2:  [94 %-100 %] 95 % (11/15 0525) Weight:  [154 lb 3.2 oz (69.945 kg)] 154 lb 3.2 oz (69.945 kg) (11/15 0500) Last BM Date: 03/03/12  Intake/Output from previous day: 11/14 0701 - 11/15 0700 In: 1387.5 [P.O.:600; I.V.:787.5] Out: 260 [Urine:200; Drains:60]  General appearance: A/A/O no acute distress Chest: CTA bilaterally Cardiac: RRR, no M/R/G Abdomen: non distended or tender; wound vac in place and functioning. (no output recorded) Perc drain (60ml ) Extremities: PICC line in RU arm, + pulses bilaterally, no edema or tenderness, warm to touch. VSS, afebrile Labs: pend  Lab Results:   [facility] 03/02/12 0349  WBC 7.8  HGB 8.7*  HCT 26.4*  PLT 415*     Basename 03/02/12 0349  NA 136  K 4.1  CL 106  CO2 23  GLUCOSE 113*  BUN 5*  CREATININE 0.73  CALCIUM 7.9*    Studies/Results: Ct Abd Limited W/o Cm  03/02/2012  *RADIOLOGY REPORT*  Clinical Data: Right lower quadrant abscess  CT ABDOMEN LIMITED WITHOUT CONTRAST  Technique: Noncontrast  imaging through the abdomen was performed.  Comparison:  Yesterday.  Findings: Noncontrast imaging through the abdomen confirms positioning of the abscess drain within the right retroperitoneal fluid collection.  The abscess drain was retracted inferiorly into the larger pocket. It was resutured to the skin at the stat lock device.  Repeat imaging confirms that the tip of the pigtail drain is now positioned within the inferior pocket of fluid.  IMPRESSION: Successful repositioning of the retroperitoneal abscess drain.  The tip is now positioned within the larger more inferior pocket of fluid.   Original Report Authenticated By: Jolaine Click, M.D.     Assessment / Plan:  Patient Active Problem List  Diagnosis  . 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 Perforated viscus  . Abdominal abscess  . Sepsis  . Acute blood loss anemia  . Pleural effusion, bilateral  . Atelectasis  Status post ileocolonic resection for Crohn's disease, post op abscess formation.   Plan: Change to PO Cipro, DC rocephin Continue to follow clinical picture Continue with wound vac (m,w,f) encourage OOB/ambulation Probably home soon.    Blenda Mounts [facility] Pager # [phone]  03/04/2012

## 2012-03-04 NOTE — Progress Notes (Signed)
Subjective: Pt ok.  No new c/o  Objective: Physical Exam: BP 174/74  Pulse 76  Temp 97.6 F (36.4 C) (Oral)  Resp 20  Ht 5\' 2"  (1.575 m)  Wt 154 lb 3.2 oz (69.945 kg)  BMI 28.20 kg/m2  SpO2 95% Drain intact, site clean. Purulent output, improved since repositioning, 60cc yesterday   Labs: CBC  Basename 03/04/12 0825 03/02/12 0349  WBC 9.4 7.8  HGB 10.4* 8.7*  HCT 31.8* 26.4*  PLT 508* 415*   BMET  Basename 03/04/12 0825 03/02/12 0349  NA 129* 136  K 4.0 4.1  CL 98 106  CO2 24 23  GLUCOSE 114* 113*  BUN 3* 5*  CREATININE 0.68 0.73  CALCIUM 8.5 7.9*   LFT No results found for this basename: PROT,ALBUMIN,AST,ALT,ALKPHOS,BILITOT,BILIDIR,IBILI,LIPASE in the last 72 hours PT/INR  Basename 03/02/12 0349  LABPROT 15.1  INR 1.21     Studies/Results: Ct Abd Limited W/o Cm  03/02/2012  *RADIOLOGY REPORT*  Clinical Data: Right lower quadrant abscess  CT ABDOMEN LIMITED WITHOUT CONTRAST  Technique: Noncontrast imaging through the abdomen was performed.  Comparison:  Yesterday.  Findings: Noncontrast imaging through the abdomen confirms positioning of the abscess drain within the right retroperitoneal fluid collection.  The abscess drain was retracted inferiorly into the larger pocket. It was resutured to the skin at the stat lock device.  Repeat imaging confirms that the tip of the pigtail drain is now positioned within the inferior pocket of fluid.  IMPRESSION: Successful repositioning of the retroperitoneal abscess drain.  The tip is now positioned within the larger more inferior pocket of fluid.   Original Report Authenticated By: Jolaine Click, M.D.     Assessment/Plan: s/p right RP abscess drainage 11/5; Repositioned 11/14 with improved output Cont to follow    LOS: 27 days    Brayton El PA-C 03/04/2012 9:15 AM

## 2012-03-05 NOTE — Progress Notes (Addendum)
15 Days Post-Op  Subjective: Feeling some better today.   Objective: Vital signs in last 24 hours: Temp:  [97.4 F (36.3 C)-98.6 F (37 C)] 97.4 F (36.3 C) (11/16 0554) Pulse Rate:  [74-79] 74  (11/16 0554) Resp:  [18-20] 18  (11/15 2121) BP: (148-158)/(76-86) 148/76 mmHg (11/16 0554) SpO2:  [95 %-98 %] 98 % (11/16 0554) Weight:  [150 lb 5.7 oz (68.2 kg)] 150 lb 5.7 oz (68.2 kg) (11/16 0607) Last BM Date: 03/03/12  Intake/Output from previous day: 11/15 0701 - 11/16 0700 In: 240 [P.O.:240] Out: 570 [Urine:550; Drains:20]    PE:  Drain intact with purulent fluid noted. No evidence of leakage around drain.  Approximately 80 ml total last 48 hours so far - drain repositioned 03/02/12. Cultures positive for Kleb. Pneumoniae and yeast.   Lab Results:   Basename 03/04/12 0825  WBC 9.4  HGB 10.4*  HCT 31.8*  PLT 508*   BMET  Basename 03/04/12 0825  NA 129*  K 4.0  CL 98  CO2 24  GLUCOSE 114*  BUN 3*  CREATININE 0.68  CALCIUM 8.5    Anti-infectives: Anti-infectives     Start     Dose/Rate Route Frequency Ordered Stop   03/04/12 0800   ciprofloxacin (CIPRO) tablet 500 mg        500 mg Oral 2 times daily 03/04/12 0651     03/03/12 0900   cefTRIAXone (ROCEPHIN) 1 g in dextrose 5 % 50 mL IVPB  Status:  Discontinued        1 g 100 mL/hr over 30 Minutes Intravenous Every 24 hours 03/03/12 0744 03/04/12 0650   03/03/12 0830   metroNIDAZOLE (FLAGYL) tablet 500 mg        500 mg Oral 3 times per day 03/03/12 0743     02/28/12 1200   cefTRIAXone (ROCEPHIN) 1 g in dextrose 5 % 50 mL IVPB  Status:  Discontinued        1 g 100 mL/hr over 30 Minutes Intravenous Every 24 hours 02/27/12 1140 03/03/12 0743   02/27/12 1400   metroNIDAZOLE (FLAGYL) IVPB 500 mg  Status:  Discontinued        500 mg 100 mL/hr over 60 Minutes Intravenous Every 8 hours 02/27/12 1140 03/03/12 0742   02/27/12 1300   cefTRIAXone (ROCEPHIN) 1 g in dextrose 5 % 50 mL IVPB        1 g 100 mL/hr over 30  Minutes Intravenous  Once 02/27/12 1140 02/27/12 1430   02/24/12 0100   piperacillin-tazobactam (ZOSYN) IVPB 3.375 g  Status:  Discontinued        3.375 g 12.5 mL/hr over 240 Minutes Intravenous 3 times per day 02/23/12 1807 02/27/12 1138   02/20/12 1200   piperacillin-tazobactam (ZOSYN) IVPB 3.375 g  Status:  Discontinued        3.375 g 12.5 mL/hr over 240 Minutes Intravenous 4 times per day 02/20/12 1010 02/23/12 1807   02/14/12 1000   metroNIDAZOLE (FLAGYL) IVPB 500 mg  Status:  Discontinued        500 mg 100 mL/hr over 60 Minutes Intravenous Every 6 hours 02/14/12 0924 02/16/12 1043   02/08/12 1400   ceFEPIme (MAXIPIME) 1 g in dextrose 5 % 50 mL IVPB  Status:  Discontinued        1 g 100 mL/hr over 30 Minutes Intravenous 3 times per day 02/08/12 1137 02/08/12 1141   02/08/12 1400   piperacillin-tazobactam (ZOSYN) IVPB 3.375 g  Status:  Discontinued  3.375 g 12.5 mL/hr over 240 Minutes Intravenous Every 8 hours 02/08/12 1145 02/18/12 0721   02/08/12 1300   levofloxacin (LEVAQUIN) IVPB 750 mg  Status:  Discontinued        750 mg 100 mL/hr over 90 Minutes Intravenous Every 24 hours 02/08/12 1137 02/08/12 1141   02/06/12 1800   vancomycin (VANCOCIN) IVPB 1000 mg/200 mL premix  Status:  Discontinued        1,000 mg 200 mL/hr over 60 Minutes Intravenous Every 12 hours 02/06/12 1448 02/08/12 1133   02/06/12 1445   ceFEPIme (MAXIPIME) 1 g in dextrose 5 % 50 mL IVPB  Status:  Discontinued        1 g 100 mL/hr over 30 Minutes Intravenous 3 times per day 02/06/12 1431 02/08/12 1137   02/06/12 1445   levofloxacin (LEVAQUIN) IVPB 750 mg  Status:  Discontinued        750 mg 100 mL/hr over 90 Minutes Intravenous Every 24 hours 02/06/12 1431 02/08/12 1137          Assessment/Plan: Retroperitoneal abscess s/p percutaneous drainage 11/5 with repositioning 11/13.  Purulent drainage persists. Recommend to continue with drain at this time with at least 5-10 ml flushes 1-2 times daily  as OP if discharged home on Monday.  A recording of output will need to be kept so that repeat CT can be performed once output </= 10 ml daily above flushing amount and afebrile.     LOS: 28 days    CAMPBELL,PAMELA D 11/16/2013F

## 2012-03-05 NOTE — Progress Notes (Signed)
Patient ID: Michele David, female   DOB: Jul 13, 1945, 66 y.o.   MRN: [id number]  General Surgery - [facility] - Progress Note  Subjective: Doing well, no complaints, tolerating some food  Objective: Vital signs in last 24 hours: Temp:  [97.4 F (36.3 C)-98.6 F (37 C)] 97.4 F (36.3 C) (11/16 0554) Pulse Rate:  [74-79] 74  (11/16 0554) Resp:  [18-20] 18  (11/15 2121) BP: (148-158)/(76-86) 148/76 mmHg (11/16 0554) SpO2:  [95 %-98 %] 98 % (11/16 0554) Weight:  [150 lb 5.7 oz (68.2 kg)] 150 lb 5.7 oz (68.2 kg) (11/16 0607) Last BM Date: 03/03/12  Intake/Output from previous day: 11/15 0701 - 11/16 0700 In: 240 [P.O.:240] Out: 570 [Urine:550; Drains:20]  Filed Vitals:   03/05/12 0554  BP: 148/76  Pulse: 74  Temp: 97.4 F (36.3 C)  Resp:     General appearance: A/A/O no acute distress Abdomen: non distended or tender; wound vac in place and functioning. Perc drain (20ml purulent)   Lab Results:   [facility] 03/04/12 0825  WBC 9.4  HGB 10.4*  HCT 31.8*  PLT 508*     Basename 03/04/12 0825  NA 129*  K 4.0  CL 98  CO2 24  GLUCOSE 114*  BUN 3*  CREATININE 0.68  CALCIUM 8.5    Studies/Results: No results found.  Assessment / Plan:  Patient Active Problem List  Diagnosis  . 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 Perforated viscus  . Abdominal abscess  . Sepsis  . Acute blood loss anemia  . Pleural effusion, bilateral  . Atelectasis  Status post ileocolonic resection for Crohn's disease, post op abscess formation.   Plan: Continue PO Cipro, DC rocephin Continue to follow clinical picture Continue with wound vac (m,w,f) encourage OOB/ambulation Probably home on Mon.    Vanita Panda, MD  Colorectal and General Surgery [facility]  03/05/2012

## 2012-03-05 NOTE — Progress Notes (Signed)
Pt. Had 5 beats of V-Tach. Pt. Was assessed and she was observed to be resting in bed.  She expressed that she had no complaints of pain in her chest or any other discomfort.  Will continue to monitor.

## 2012-03-06 NOTE — Plan of Care (Signed)
Problem: Phase III Progression Outcomes Goal: IV changed to normal saline lock Outcome: Not Applicable Date Met:  03/06/12 Has picc running at [facility]

## 2012-03-06 NOTE — Progress Notes (Signed)
Patient ID: Michele David, female   DOB: 1946/01/14, 66 y.o.   MRN: [id number]  General Surgery - [facility] - Progress Note  Subjective: Doing well, no complaints, tolerating a diet, having BM's  Objective: Vital signs in last 24 hours: Temp:  [97.8 F (36.6 C)-98.5 F (36.9 C)] 97.8 F (36.6 C) (11/17 0613) Pulse Rate:  [74-84] 74  (11/17 0613) Resp:  [20] 20  (11/17 0613) BP: (125-141)/(76-83) 125/76 mmHg (11/17 0613) SpO2:  [97 %] 97 % (11/17 0613) Last BM Date: 03/05/12  Intake/Output from previous day:    Filed Vitals:   03/06/12 0613  BP: 125/76  Pulse: 74  Temp: 97.8 F (36.6 C)  Resp: 20    General appearance: A/A/O no acute distress Abdomen: non distended or tender; wound vac in place and functioning. Perc drain with purulent output   Lab Results:   Basename 03/04/12 0825  WBC 9.4  HGB 10.4*  HCT 31.8*  PLT 508*     Basename 03/04/12 0825  NA 129*  K 4.0  CL 98  CO2 24  GLUCOSE 114*  BUN 3*  CREATININE 0.68  CALCIUM 8.5    Studies/Results: No results found.  Assessment / Plan:  Patient Active Problem List  Diagnosis  . 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 Perforated viscus  . Abdominal abscess  . Sepsis  . Acute blood loss anemia  . Pleural effusion, bilateral  . Atelectasis  Status post ileocolonic resection for Crohn's disease, post op abscess formation.   Plan: Continue PO Cipro/Flagyl Continue to follow clinical picture Continue with wound vac (m,w,f) encourage OOB/ambulation home on Mon.    Vanita Panda, MD  Colorectal and General Surgery [facility]  03/06/2012

## 2012-03-07 MED ORDER — HEPARIN SOD (PORK) LOCK FLUSH 100 UNIT/ML IV SOLN
250.0000 [IU] | Freq: Every day | INTRAVENOUS | Status: DC
  Filled 2012-03-07: qty 3

## 2012-03-07 MED ORDER — HEPARIN SOD (PORK) LOCK FLUSH 100 UNIT/ML IV SOLN
250.0000 [IU] | INTRAVENOUS | Status: DC | PRN
  Administered 2012-03-07: 250 [IU]
  Filled 2012-03-07: qty 3

## 2012-03-07 MED ORDER — CIPROFLOXACIN HCL 500 MG PO TABS: 500.0000 mg | ORAL_TABLET | Freq: Two times a day (BID) | ORAL | Status: DC

## 2012-03-07 MED ORDER — METRONIDAZOLE 500 MG PO TABS: 500.0000 mg | ORAL_TABLET | Freq: Three times a day (TID) | ORAL | Status: DC

## 2012-03-07 MED ORDER — CALCIUM CARBONATE ANTACID 500 MG PO CHEW: 2.0000 | CHEWABLE_TABLET | Freq: Three times a day (TID) | ORAL | Status: DC

## 2012-03-07 MED ORDER — OXYCODONE-ACETAMINOPHEN 5-325 MG PO TABS: 1.0000 | ORAL_TABLET | ORAL | Status: DC | PRN

## 2012-03-07 NOTE — Discharge Summary (Signed)
Agree with above.  Wilmon Arms. Corliss Skains, MD, [facility] Surgery  03/07/2012 1:42 PM

## 2012-03-07 NOTE — Progress Notes (Signed)
Wound care to abdominal wound as ordered. Will continue to monitor.

## 2012-03-07 NOTE — Progress Notes (Signed)
Discharge instructions given to pt, verbalized understanding. Left the unit in stable condition. 

## 2012-03-07 NOTE — Discharge Summary (Signed)
Physician Discharge Summary  Patient ID: Michele David MRN: [id number] DOB/AGE: Jul 24, 1945 66 y.o.  Admit date: 02/06/2012 Discharge date: 03/07/2012  Admission Diagnoses:abdominal pain, perforated viscus   Patient Active Problem List  Diagnosis  . 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 Perforated viscus  . Abdominal abscess  . Sepsis  . Acute blood loss anemia  . Pleural effusion, bilateral  . Atelectasis   Discharge Diagnoses:  Principal Problem:  *Abdominal abscess Active Problems:  Crohn's disease of small intestine with complication  Sepsis  Acute blood loss anemia  Pleural effusion, bilateral  Atelectasis   Discharged Condition: stable  Hospital Course: Patient is a 66 year old black female referred from urgent care to the emergency department for evaluation of acute onset of abdominal pain. Patient experienced onset of lower abdominal pain approximately 4 AM on the day of admission. Pain radiated to the right shoulder and to the back. She developed nausea. She presented to the emergency department. White blood cell count was normal at 9.5 but there was a significant left shift of 93%. CT scan of the abdomen and pelvis was obtained and shows findings consistent with active Crohn's disease with perforation of the distal small bowel. General surgery is consulted for management.  Past surgical history is notable for exploratory laparotomy and resection of distal small bowel and proximal colon in the 1980s. The patient also had bilateral tubal ligation.  Past medical history is notable for hypertension and right lower extremity lymphedema. Patient's gastroenterologist has not been involved in her care for at least 3 years. Ct of the abdomen done on 01/23/12: IMPRESSION:  1. Moderate amount of abnormal free peritoneal gas and scattered  ascites. Suspected site of perforation is in the distal ileum.  Distal ileal inflammation is most compatible with  Crohn's disease.  2. Possible fistula between to loops of distal ileum on image 34  of series 5.  3. Small right pleural effusion.  4. Nonobstructive left nephrolithiasis.  Original Report Authenticated By: Dellia Cloud, M.D. Patient underwent 1. Exploratory laparotomy.  2. Extensive lysis of adhesions  3. Resection of ileocolonic anastomosis with perforation.  4. Ileocolonic anastomosis between mid ileum and proximal transverse  colon. Which was done by Dr. Gerrit Friends on 02/03/12. Her post operative course has been lengthy requiring consultation with CCM, IR, nutrition ,rehabmedicine, wound care and rehab medicine. At present she has a wound vac in place, and her abdominal wound is healing well, she has progressed with her diet and has had a return of bowel function and is doing well with PT. She is stable for discharge; and will be followed at home for her wound care with scheduled wound vac changes 3 times a week and will be followed up by [place] and IR in one weeks time post discharge.  She will continue on po abx for at least 7-10 days and has been given Rx for this along with post-op care instructions and our contact information should she need to contact our office prior to her scheduled appointment.   Consults: Rehab medicine, CCM, Nutrition, PT/OT, IR, Wound care.  Significant Diagnostic Studies: labs,microbiology, and radiology.  Treatments: IV hydration, antibiotics, analgesia, anticoagulation,,respiratory therapy, therapies,procedures,and surgery.  Discharge Exam: Blood pressure 129/59, pulse 73, temperature 98.3 F (36.8 C), temperature source Oral, resp. rate 18, height 5\' 2"  (1.575 m), weight 144 lb 8 oz (65.545 kg), SpO2 96.00%. General appearance: alert, cooperative, appears stated age and no distress Chest: CTA bilaterally Cardiac: RRR No M/R/G Abdomen: wound  vac in place and functioning well; abdominal drain in place in RLQ draining purulent appearing  fluid. Extremities: warm to touch, + pulses, no edema, no tenderness.  Disposition: home self care with HHN to assist with wound vac changes.  Discharge Orders    Future Appointments: Provider: Department: Dept Phone: Center:   03/14/2012 9:00 AM Wl-Ct 2 [facility] [phone] [place]     Future Orders Please Complete By Expires   CT Abdomen Pelvis W Contrast  03/14/12 06/07/13   Scheduling Instructions:   Please inject drain as well at time of CT   Thank you   Questions: Responses:   Preferred imaging location? [facility]   Reason for exam: f/u abdominal abscess       Medication List     As of 03/07/2012  8:36 AM    TAKE these medications         calcium carbonate 500 MG chewable tablet   Commonly known as: TUMS - dosed in mg elemental calcium   Chew 2 tablets (400 mg of elemental calcium total) by mouth 3 (three) times daily with meals.      ciprofloxacin 500 MG tablet   Commonly known as: CIPRO   Take 1 tablet (500 mg total) by mouth 2 (two) times daily.      denosumab 60 MG/ML Soln injection   Commonly known as: PROLIA   Inject 60 mg into the skin every 6 (six) months. Administer in upper arm, thigh, or abdomen      furosemide 20 MG tablet   Commonly known as: LASIX   Take 20 mg by mouth daily as needed. For fluid retention      losartan 50 MG tablet   Commonly known as: COZAAR   Take 50 mg by mouth daily.      metroNIDAZOLE 500 MG tablet   Commonly known as: FLAGYL   Take 1 tablet (500 mg total) by mouth every 8 (eight) hours.      naproxen sodium 220 MG tablet   Commonly known as: ANAPROX   Take 220 mg by mouth 2 (two) times daily as needed. For pain      oxyCODONE-acetaminophen 5-325 MG per tablet   Commonly known as: PERCOCET/ROXICET   Take 1-2 tablets by mouth every 4 (four) hours as needed.      vitamin B-12 1000 MCG tablet   Commonly known as: CYANOCOBALAMIN   Take 1,000 mcg by mouth daily.       vitamin E 400 UNIT capsule   Take 400 Units by mouth daily.           Follow-up Information    Follow up with Velora Heckler, MD. In 1 week. (Please plan on arriving 30 minutes earlier than your scheduled appointment for check in procedures.   Thank you)    Contact information:   [address] [place] [address] [phone]          Signed: Blenda Mounts [facility]  Pager # [phone]  03/07/2012, 8:36 AM

## 2012-03-08 ENCOUNTER — Telehealth (INDEPENDENT_AMBULATORY_CARE_PROVIDER_SITE_OTHER): Payer: Self-pay

## 2012-03-08 NOTE — Telephone Encounter (Signed)
Michele David at [facility] called requesting directions on drain care. I advised her drain was placed by IVR at hospital as a DOW pt  and there should be d/c orders for this. Michele David reviewed records pt came home with and found the drain care instructions. I asked her if pt has f/u appt with our office. She states pt was advised to come to afternoon  clinic on tues 03-15-12 at our office. She will call with questions or concerns.

## 2012-03-14 ENCOUNTER — Ambulatory Visit (HOSPITAL_COMMUNITY)
Admit: 2012-03-14 | Discharge: 2012-03-14 | Disposition: A | Discharge status: Home / Self Care | Payer: Medicare Other | Attending: General Surgery | Admitting: General Surgery

## 2012-03-14 ENCOUNTER — Ambulatory Visit (HOSPITAL_COMMUNITY)
Admission: RE | Admit: 2012-03-14 | Discharge: 2012-03-14 | Disposition: A | Discharge status: Home / Self Care | Payer: Medicare Other | Source: Ambulatory Visit | Attending: Radiology | Admitting: Radiology

## 2012-03-14 DIAGNOSIS — R188 Other ascites: Secondary | ICD-10-CM | POA: Insufficient documentation

## 2012-03-14 DIAGNOSIS — K5090 Crohn's disease, unspecified, without complications: Secondary | ICD-10-CM | POA: Insufficient documentation

## 2012-03-14 MED ORDER — IOHEXOL 300 MG/ML  SOLN
100.0000 mL | Freq: Once | INTRAMUSCULAR | Status: AC | PRN
  Administered 2012-03-14: 100 mL via INTRAVENOUS

## 2012-03-14 NOTE — Procedures (Signed)
Drain check is negative for definitive communication with an adjacent loop of bowel.  CT performed earlier same day demonstrates resolution of abd abscess.  Drain removed intact at bedside.  Existing PICC also removed intact at bedside per request from Dr. Gerrit Friends.

## 2012-03-14 NOTE — Progress Notes (Signed)
Patient had ct and drain injection today with drain removal.  Patient was concerned about leaving the PICC line when it was not being used.  Dr Grace Isaac called [facility] to discuss removing picc.  The picc was removed and documented per his instructions.

## 2012-03-22 ENCOUNTER — Ambulatory Visit (INDEPENDENT_AMBULATORY_CARE_PROVIDER_SITE_OTHER): Payer: Medicare Other | Admitting: Surgery

## 2012-03-22 ENCOUNTER — Encounter (INDEPENDENT_AMBULATORY_CARE_PROVIDER_SITE_OTHER): Payer: Self-pay | Admitting: Surgery

## 2012-03-22 VITALS — BP 136/82 | HR 81 | Temp 98.1°F | Resp 18 | Ht 62.0 in | Wt 134.5 lb

## 2012-03-22 DIAGNOSIS — K651 Peritoneal abscess: Secondary | ICD-10-CM

## 2012-03-22 DIAGNOSIS — K5000 Crohn's disease of small intestine without complications: Secondary | ICD-10-CM

## 2012-03-22 DIAGNOSIS — IMO0002 Reserved for concepts with insufficient information to code with codable children: Secondary | ICD-10-CM

## 2012-03-22 DIAGNOSIS — K50019 Crohn's disease of small intestine with unspecified complications: Secondary | ICD-10-CM

## 2012-03-22 NOTE — Patient Instructions (Addendum)
May shower daily.  Apply triple antibiotic ointment to abdominal wound twice daily and dress with dry gauze.  Apply dry gauze to right flank drain site wound twice daily as needed.  Velora Heckler, MD, [facility] Office: [phone]

## 2012-03-22 NOTE — Progress Notes (Signed)
General Surgery [facility]  Visit Diagnoses: 1. Crohn's disease of small intestine with complication   2. Abdominal abscess     HISTORY: Patient returns for followup. She underwent a difficult resection of her ileocolonic anastomosis do to recurrent Crohn's disease and perforation in early October 2013. Her postoperative course was complicated by abscess and required percutaneous drainage. Followup CT scan showed resolution of the abscess and injection of the drainage catheter showed no evidence of fistula.  Drainage catheter was subsequently removed. Patient has had a VAC dressing on the midline abdominal wound and it was changed yesterday.  EXAM: Right flank wound is small, punctate, with small brownish drainage on dry gauze dressing. This is cleaned and replaced. Midline abdominal wound is nearly completely epithelialized and the VAC dressing is removed and discarded. Antibiotic ointment and dry gauze dressings are placed.  IMPRESSION: Status post ileocolonic resection for Crohn's disease with complications  PLAN: The vacuum dressing is discontinued from the abdominal wound. Patient will change the dressing to the abdominal wound with triple antibiotic ointment twice daily. She may shower once daily. She will apply dry gauze dressings to the right flank wound until it is healed. Patient will return to see me in 4 weeks for follow up.  Velora Heckler, MD, FACS General & Endocrine Surgery [facility]

## 2012-03-29 ENCOUNTER — Telehealth (INDEPENDENT_AMBULATORY_CARE_PROVIDER_SITE_OTHER): Payer: Self-pay

## 2012-03-29 NOTE — Telephone Encounter (Signed)
Rx for Norco 5/325 #30 w/ no refills approved by Dr. Gerrit Friends and called to [facility] [address]  Pt is aware.

## 2012-04-27 ENCOUNTER — Encounter (INDEPENDENT_AMBULATORY_CARE_PROVIDER_SITE_OTHER): Payer: Self-pay | Admitting: Surgery

## 2012-04-27 ENCOUNTER — Telehealth (INDEPENDENT_AMBULATORY_CARE_PROVIDER_SITE_OTHER): Payer: Self-pay

## 2012-04-27 ENCOUNTER — Ambulatory Visit (INDEPENDENT_AMBULATORY_CARE_PROVIDER_SITE_OTHER): Payer: Medicare Other | Admitting: Surgery

## 2012-04-27 VITALS — BP 138/78 | HR 64 | Temp 97.9°F | Resp 14 | Ht 62.0 in | Wt 134.6 lb

## 2012-04-27 DIAGNOSIS — K50019 Crohn's disease of small intestine with unspecified complications: Secondary | ICD-10-CM

## 2012-04-27 DIAGNOSIS — K651 Peritoneal abscess: Secondary | ICD-10-CM

## 2012-04-27 DIAGNOSIS — IMO0002 Reserved for concepts with insufficient information to code with codable children: Secondary | ICD-10-CM

## 2012-04-27 DIAGNOSIS — K5000 Crohn's disease of small intestine without complications: Secondary | ICD-10-CM

## 2012-04-27 NOTE — Progress Notes (Signed)
General Surgery [facility]  Visit Diagnoses: 1. Crohn's disease of small intestine with complication   2. Abdominal abscess     HISTORY: The patient returns for followup. She had undergone a complicated resection of her previous ileocolonic anastomosis in early October 2013. Postoperative course has been complicated due to her Crohn's disease with abscess and fistula.  She is tolerating a regular diet. She feels stronger and more energetic. She has noted only small drainage from the right flank drain site and from her midline abdominal incision.  EXAM: Examination of the right flank shows a punctate wound at the site of drain placement. There is no evidence of cellulitis. There is no fluctuance. There is no significant drainage. Dry gauze dressing is replaced.  Examination of the abdomen shows it to be soft, nontender, without distention. Midline wound has a small sinus tract at the inferior aspect. There is no significant drainage. This is probed with a silver nitrate stick and treated with silver nitrate. Dry gauze dressing is placed.  IMPRESSION: #1 status post resection ileocolonic anastomosis #2 Crohn's disease #3 delayed wound healing, rule out fistula formation  PLAN: Patient will continue local wound care. Home health nurse care will be discontinued at this point. Patient will return for wound check in 6 weeks.  I have asked the patient to follow-up with her gastroenterologist and to begin treatment for Crohn's disease prior to her next office visit here.  Velora Heckler, MD, FACS General & Endocrine Surgery [facility]

## 2012-04-27 NOTE — Telephone Encounter (Signed)
Per Dr Ardine Eng order Michele David at [facility] notified to cx [facility] visits.

## 2012-04-27 NOTE — Patient Instructions (Signed)
Continue to cover wounds with dry gauze dressings as needed.   COCOA BUTTER & VITAMIN E CREAM  ([employer] or other brand)  Apply cocoa butter/vitamin E cream to your incision 2 - 3 times daily.  Massage cream into incision for one minute with each application.  Use sunscreen (50 SPF or higher) for first 6 months after surgery if area is exposed to sun.  You may substitute Mederma or other scar reducing creams as desired.

## 2012-06-07 ENCOUNTER — Telehealth (INDEPENDENT_AMBULATORY_CARE_PROVIDER_SITE_OTHER): Payer: Self-pay

## 2012-06-07 ENCOUNTER — Encounter (INDEPENDENT_AMBULATORY_CARE_PROVIDER_SITE_OTHER): Payer: Self-pay | Admitting: Surgery

## 2012-06-07 ENCOUNTER — Ambulatory Visit (INDEPENDENT_AMBULATORY_CARE_PROVIDER_SITE_OTHER): Payer: Medicare Other | Admitting: Surgery

## 2012-06-07 VITALS — BP 142/80 | HR 74 | Temp 97.8°F | Resp 18 | Ht 62.0 in | Wt 145.2 lb

## 2012-06-07 DIAGNOSIS — IMO0002 Reserved for concepts with insufficient information to code with codable children: Secondary | ICD-10-CM

## 2012-06-07 DIAGNOSIS — K50019 Crohn's disease of small intestine with unspecified complications: Secondary | ICD-10-CM

## 2012-06-07 DIAGNOSIS — K5000 Crohn's disease of small intestine without complications: Secondary | ICD-10-CM

## 2012-06-07 DIAGNOSIS — K651 Peritoneal abscess: Secondary | ICD-10-CM

## 2012-06-07 NOTE — Patient Instructions (Signed)
Continue local wound care as needed.  Cocoa butter to midline abdominal incision.  No antibiotic ointment needed.  Velora Heckler, MD, [facility] Office: [phone]

## 2012-06-07 NOTE — Telephone Encounter (Signed)
I attempted to make appt with Dr Loreta Ave for Dr Elnoria Howard per Dr Ardine Eng request. Office declined appt until pt contacts their office. Pt advised and given phone # to call. Pt states she understands and will call office to make appt.

## 2012-06-07 NOTE — Progress Notes (Signed)
General Surgery [facility]  Visit Diagnoses: 1. Crohn's disease of small intestine with complication   2. Abdominal abscess     HISTORY: Patient is a 67 year old black female with Crohn's disease. She underwent a difficult operation for resection of her previous ileocolonic anastomosis to fistulae and abscess formation.  Patient has now recovered nicely from the procedure. Postoperatively her course was complicated by recurrent abscess formation requiring percutaneous drainage. This was through a flank approach. She has had persistent intermittent drainage from the site of the drainage catheter. She goes as long as one week without any drainage.  PERTINENT REVIEW OF SYSTEMS: Patient has noted intermittent drainage through the right flank drainage site. No signs or symptoms of infection. Denies fever. Good appetite. 2 bowel movements daily.  EXAM: HEENT: normocephalic; pupils equal and reactive; sclerae clear; dentition good; mucous membranes moist NECK:  symmetric on extension; no palpable anterior or posterior cervical lymphadenopathy; no supraclavicular masses; no tenderness CHEST: clear to auscultation bilaterally without rales, rhonchi, or wheezes CARDIAC: regular rate and rhythm without significant murmur; peripheral pulses are full ABDOMEN: Soft without distention; midline incision well healed; right flank with small punctate opening measuring 2 mm with thin cloudy drainage; dry gauze dressing applied EXT:  non-tender without edema; no deformity NEURO: no gross focal deficits; no sign of tremor   IMPRESSION: #1 Crohn's disease #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 Overall she feels that she is doing quite well. I am hesitant to perform any further diagnostic studies at this point. I have recommended that she return to the care of her gastroenterologist. I believe if she  would resume medical therapy for management of her Crohn's disease the fistula to the right flank might close without further surgical intervention. We will assist her in resuming a relationship with her gastroenterologist in the near future.  Patient will return for surgical followup in 4 months.  Velora Heckler, MD, [facility] Office: [phone]

## 2012-07-18 ENCOUNTER — Telehealth (INDEPENDENT_AMBULATORY_CARE_PROVIDER_SITE_OTHER): Payer: Self-pay

## 2012-07-18 ENCOUNTER — Ambulatory Visit (INDEPENDENT_AMBULATORY_CARE_PROVIDER_SITE_OTHER): Payer: Medicare Other | Admitting: General Surgery

## 2012-07-18 ENCOUNTER — Encounter (INDEPENDENT_AMBULATORY_CARE_PROVIDER_SITE_OTHER): Payer: Self-pay | Admitting: General Surgery

## 2012-07-18 VITALS — BP 136/82 | HR 66 | Temp 98.3°F | Resp 16 | Ht 62.0 in | Wt 144.8 lb

## 2012-07-18 DIAGNOSIS — L03319 Cellulitis of trunk, unspecified: Secondary | ICD-10-CM

## 2012-07-18 DIAGNOSIS — L02219 Cutaneous abscess of trunk, unspecified: Secondary | ICD-10-CM

## 2012-07-18 NOTE — Telephone Encounter (Signed)
I returned pts call. Pt concerned about the amount of drainage coming from wd. Pt advised to keep appt today for ck of area.

## 2012-07-18 NOTE — Progress Notes (Signed)
The patient is status post right colectomy for Crohn's disease. She had a very complicated course. Over the last 4-5 days she's had drainage from her right flank. It is described as mucopurulent. He is not foul-smelling. It's probably in the range of 3-5 cc per day.  I closed the wound was able to probe at approximately 11 cm up towards the abdominal cavity and medially.  With her history of Crohn's disease is a possibility that she does have a fistula. She may have a deeper abscess needs to be drained. I will go ahead and order a CT scan of her abdomen and pelvis with contrast. Her followup should be with Dr. Nicoletta Ba who did her surgery back in November of 2013. In the meantime the patient is to keep wound clean and covered with antibiotic ointment and change dressing as needed for drainage.

## 2012-07-18 NOTE — Telephone Encounter (Signed)
The pt called reporting some drainage from one of her wounds.  I made her an appointment for urgent clinic today.  She would prefer to see Dr Gerrit Friends when he is in this week.  He knows her case.  I left the appointment where it is but told her I would send a message to [facility] to see if she can be worked in to see Dr Gerrit Friends instead.

## 2012-07-19 ENCOUNTER — Other Ambulatory Visit (INDEPENDENT_AMBULATORY_CARE_PROVIDER_SITE_OTHER): Payer: Self-pay | Admitting: General Surgery

## 2012-07-19 DIAGNOSIS — L02219 Cutaneous abscess of trunk, unspecified: Secondary | ICD-10-CM

## 2012-07-19 DIAGNOSIS — L02211 Cutaneous abscess of abdominal wall: Secondary | ICD-10-CM

## 2012-07-22 ENCOUNTER — Ambulatory Visit
Admission: RE | Admit: 2012-07-22 | Discharge: 2012-07-22 | Disposition: A | Discharge status: Home / Self Care | Payer: Medicare Other | Source: Ambulatory Visit | Attending: General Surgery | Admitting: General Surgery

## 2012-07-22 MED ORDER — IOHEXOL 300 MG/ML  SOLN
100.0000 mL | Freq: Once | INTRAMUSCULAR | Status: AC | PRN
  Administered 2012-07-22: 100 mL via INTRAVENOUS

## 2012-07-28 ENCOUNTER — Telehealth (INDEPENDENT_AMBULATORY_CARE_PROVIDER_SITE_OTHER): Payer: Self-pay

## 2012-07-28 ENCOUNTER — Telehealth (INDEPENDENT_AMBULATORY_CARE_PROVIDER_SITE_OTHER): Payer: Self-pay | Admitting: *Deleted

## 2012-07-28 NOTE — Telephone Encounter (Signed)
Msg sent to Dr Gerrit Friends to review the last note in epic and ct that was ordered by Dr Lindie Spruce. Pt is calling for Ct result and recommendation.

## 2012-07-28 NOTE — Telephone Encounter (Signed)
Patient awaiting CT results and what the plan forward is.

## 2012-07-29 ENCOUNTER — Telehealth (INDEPENDENT_AMBULATORY_CARE_PROVIDER_SITE_OTHER): Payer: Self-pay

## 2012-07-29 NOTE — Telephone Encounter (Signed)
Message copied by Joanette Gula on Fri Jul 29, 2012  8:37 AM ------      Message from: Velora Heckler      Created: Thu Jul 28, 2012 12:22 PM       Arline Asp:            I've reviewed the office notes and the CT scan results.  Has she ever gone back to her gastroenterologist as I suggested in February?  If not, she needs to be under medical care.  If she needs help getting into a GI practice, we can help.  I told her this in February.            tmg                  ----- Message -----         From: Joanette Gula, LPN         Sent: 07/28/2012  11:58 AM           To: Velora Heckler, MD            Pt called for ct result. Please review note from Dr Lindie Spruce in [place] office. He ordered ct. Let me know what to tell pt.      Thanks,      Arline Asp       ------

## 2012-07-29 NOTE — Telephone Encounter (Signed)
I reviewed with pt Dr Ardine Eng msg. Pt states she is improving and feels better with more energy. Pt states she has labs each week with Dr Kenna Gilbert lab. Pt states she will call to make appt with Dr Loreta Ave. Pt request CT report be faxed to Dr Loreta Ave. Pt advised I will fax result to Dr Loreta Ave. Pt advised if she needs surgical consult or intervention to please have Dr Loreta Ave call our office and we will be glad to see pt.

## 2012-11-02 ENCOUNTER — Telehealth (INDEPENDENT_AMBULATORY_CARE_PROVIDER_SITE_OTHER): Payer: Self-pay

## 2012-11-02 NOTE — Telephone Encounter (Signed)
Pt calling b/c she is very upset with the fact that she is still having problems with a right flank wound that is still draining from her sx done in October. The pt came in a few months a go and saw Dr Lindie Spruce for this problem b/c Dr Gerrit Friends was not available at this time. The pt is only wanting to see Dr Gerrit Friends now and she is demanding something to be done about this problem. The pt stated that she has been following with Dr Loreta Ave for her chron's disease but Dr Loreta Ave told her to see Dr Gerrit Friends about the right flank wound. Pls advise pt what she needs to do about wound.

## 2012-11-03 NOTE — Telephone Encounter (Signed)
I called pt and discussed symptoms and concerns. Pt states Dr Loreta Ave has requested she come back to see Dr Gerrit Friends since fistula is not improving and drainage is increasing. Pt given appt for 11-07-12. Pt states this will work for her.

## 2012-11-07 ENCOUNTER — Encounter (INDEPENDENT_AMBULATORY_CARE_PROVIDER_SITE_OTHER): Payer: Self-pay | Admitting: Surgery

## 2012-11-07 ENCOUNTER — Ambulatory Visit (INDEPENDENT_AMBULATORY_CARE_PROVIDER_SITE_OTHER): Payer: Medicare Other | Admitting: Surgery

## 2012-11-07 VITALS — BP 136/60 | HR 61 | Temp 97.2°F | Ht 62.0 in | Wt 144.6 lb

## 2012-11-07 DIAGNOSIS — K5000 Crohn's disease of small intestine without complications: Secondary | ICD-10-CM

## 2012-11-07 DIAGNOSIS — K50019 Crohn's disease of small intestine with unspecified complications: Secondary | ICD-10-CM

## 2012-11-07 NOTE — Progress Notes (Signed)
General Surgery [facility]  Visit Diagnoses: 1. Crohn's disease of small intestine with complication     HISTORY: Patient is a 67 year old female followed for complications of Crohn's disease. Patient had had an initial resection in 1988 by Dr. Leonie Man. She had presented to the emergency department with abdominal pain and free air in October 2013. She underwent exploratory laparotomy and a difficult resection of her previous ileocolonic anastomosis. Postoperatively she developed an abscess which required percutaneous drainage. 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 Patient has been treated medically with azathioprine. This has not led to closure of her fistula. No other medical treatments have been employed.  PERTINENT REVIEW OF SYSTEMS: Patient continues to note drainage from the fistula site in the right flank. She changes the dressing 2-3 times daily. She does have discomfort at the site. She denies significant abdominal pain.  EXAM: HEENT: normocephalic; pupils equal and reactive; sclerae clear; dentition good; mucous membranes moist NECK:  symmetric on extension; no palpable anterior or posterior cervical lymphadenopathy; no supraclavicular masses; no tenderness CHEST: clear to auscultation bilaterally without rales, rhonchi, or wheezes CARDIAC: regular rate and rhythm without significant murmur; peripheral pulses are full ABDOMEN: Soft without distention; midline incision well healed; no evidence of hernia; no palpable masses; no significant tenderness; in the posterior right flank is a fistulous opening on the skin measuring 4 mm in diameter with chronic granulation tissue and brownish discharge on the dry gauze dressing; dressing is changed today in the office EXT:  non-tender without edema; no deformity NEURO: no gross focal deficits; no sign of tremor   IMPRESSION: #1 Crohn's disease #2 status post  exploratory laparotomy and resection of ileocolonic anastomosis, subsequent fistula formation  PLAN: Patient and I discussed her history. We discussed her treatment since her surgery last October. Patient does not want further surgery at this point in time. However she is seeking recommendations for management of her chronic fistula.  I am going to refer the patient to [facility] for second opinion. We'll arrange for consultation with gastroenterology and possibly colorectal surgery. Perhaps there are other medical modalities available for treatment of Crohn's disease which may lead to the healing of her fistula. Certainly this is worth evaluating before proceeding with additional, probably difficult surgery.  We will contact [facility] and arrange for consultation in the near future.  Velora Heckler, MD, [facility] Office: [phone]

## 2012-11-07 NOTE — Patient Instructions (Signed)
Crohn's Disease Crohn's disease is a long-term (chronic) soreness and redness (inflammation) of the intestines (bowel). It can affect any portion of the digestive tract, from the mouth to the anus. It can also cause problems outside the digestive tract. Crohn's disease is closely related to a disease called ulcerative colitis (together, these two diseases are called inflammatory bowel disease).  CAUSES  The cause of Crohn's disease is not known. One theory is that, in an easily affected (susceptible) person, the immune system is triggered to attack the body's own digestive tissue. Crohn's disease runs in families. It seems to be more common in certain geographic areas and amongst certain races. There are no clear-cut dietary causes.  SYMPTOMS  Crohn's disease can cause many different symptoms since it can affect many different parts of the body. Symptoms include:  Fatigue.  Weight loss.  Chronic diarrhea, sometime bloody.  Abdominal pain and cramps.  Fever.  Ulcers or canker sores in the mouth or rectum.  Anemia (low red blood cells).  Arthritis, skin problems, and eye problems may occur. Complications of Crohn's disease can include:  Series of holes (perforation) of the bowel.  Portions of the intestines sticking to each other (adhesions).  Obstruction of the bowel.  Fistula formation, typically in the rectal area but also in other areas. A fistula is an opening between the bowels and the outside, or between the bowels and another organ.  A painful crack in the mucous membrane of the anus (rectal fissure). DIAGNOSIS  Your caregiver may suspect Crohn's disease based on your symptoms and an exam. Blood tests may confirm that there is a problem. You may be asked to submit a stool specimen for examination. X-rays and CT scans may be necessary. Ultimately, the diagnosis is usually made after a procedure that uses a flexible tube that is inserted via your mouth or your anus. This is done  under sedation and is called either an upper endoscopy or colonoscopy. With these tests, the specialist can take tiny tissue samples and remove them from the inside of the bowel (biopsy). Examination of this biopsy tissue under a microscope can reveal Crohn's disease as the cause of your symptoms. Due to the many different forms that Crohn's disease can take, symptoms may be present for several years before a diagnosis is made. HOME CARE INSTRUCTIONS   There is no cure for Crohn's disease. The best treatment is frequent checkups with your caregiver.  Symptoms such as diarrhea can be controlled with medications. Avoid foods that have a laxative effect such as fresh fruit, vegetables and dairy products. During flare ups, you can rest your bowel by refraining from solid foods. Drink clear liquids frequently during the day (electrolyte or re-hydrating fluids are best. Your caregiver can help you with suggestions). Drink often to prevent loss of body fluids (dehydration). When diarrhea has cleared, eat small meals and more frequently. Avoid food additives and stimulants such as caffeine (coffee, tea, or chocolate). Enzyme supplements may help if you develop intolerance to a sugar in dairy products (lactose). Ask your caregiver or dietitian about specific dietary instructions.  Try to maintain a positive attitude. Learn relaxation techniques such as self hypnosis, mental imaging, and muscle relaxation.  If possible, avoid stresses which can aggravate your condition.  Exercise regularly.  Follow your diet.  Always get plenty of rest. SEEK MEDICAL CARE IF:   Your symptoms fail to improve after a week or two of new treatment.  You experience continued weight loss.  You have   ongoing crampy digestion or loose bowels.  You develop a new skin rash, skin sores, or eye problems. SEEK IMMEDIATE MEDICAL CARE IF:   You have worsening of your symptoms or develop new symptoms.  You have a fever.  You  develop bloody diarrhea.  You develop severe abdominal pain. MAKE SURE YOU:   Understand these instructions.  Will watch your condition.  Will get help right away if you are not doing well or get worse. Document Released: 01/14/2005 Document Revised: 06/29/2011 Document Reviewed: 12/13/2006 Exit[employer] Patient Information 2014 [employer].  

## 2012-11-08 ENCOUNTER — Other Ambulatory Visit (INDEPENDENT_AMBULATORY_CARE_PROVIDER_SITE_OTHER): Payer: Self-pay

## 2012-11-08 DIAGNOSIS — K50914 Crohn's disease, unspecified, with abscess: Secondary | ICD-10-CM

## 2013-01-30 ENCOUNTER — Telehealth (INDEPENDENT_AMBULATORY_CARE_PROVIDER_SITE_OTHER): Payer: Self-pay

## 2013-01-30 NOTE — Telephone Encounter (Signed)
Pt will need to f/u with her MD at [facility] and if Dr Loreta Ave will not see pt [facility] may have to refer her back to GI MD in [place] that will work with [facility].

## 2013-01-30 NOTE — Telephone Encounter (Signed)
Patient is states she was referred to see MD at [facility] . She had a consultation with Dr. Lenise Arena which at that time she ask could she see a Doctor in [place] and the doctor said yes. Patient attempted to go back to the first Md DR. Mann at that time she was told she could not be seen by Dr. Loreta Ave due to she had been referred to a specialist  [facility]. Please  advise

## 2013-01-30 NOTE — Telephone Encounter (Signed)
I reviewed pts concern with Dr Gerrit Friends.  Per Dr Ardine Eng request Pt advised it is unfortunate that Dr Loreta Ave will not see pt. Pts chronic condition needs to be followed and treated by GI MD. Pt advised she should f/u with Dr Donnal Debar at [facility] to start therapy and hopefully he can start [facility] with IV therapy as OP at home. Pt advised if she has fever, uncontrolled bleeding for drainage from area she is to go to ER here or at [facility] where [place] and testing can be preformed. Pt states she understands and will call back if she has any difficulty getting Dr Lucina Mellow office to give her an appt.

## 2013-01-31 ENCOUNTER — Emergency Department (HOSPITAL_COMMUNITY): Payer: Medicare Other

## 2013-01-31 ENCOUNTER — Emergency Department (HOSPITAL_COMMUNITY)
Admission: EM | Admit: 2013-01-31 | Discharge: 2013-02-01 | Disposition: A | Discharge status: Home / Self Care | Payer: Medicare Other | Attending: Emergency Medicine | Admitting: Emergency Medicine

## 2013-01-31 ENCOUNTER — Encounter (HOSPITAL_COMMUNITY): Payer: Self-pay | Admitting: Emergency Medicine

## 2013-01-31 DIAGNOSIS — Z79899 Other long term (current) drug therapy: Secondary | ICD-10-CM | POA: Insufficient documentation

## 2013-01-31 DIAGNOSIS — Z5189 Encounter for other specified aftercare: Secondary | ICD-10-CM

## 2013-01-31 DIAGNOSIS — Z8639 Personal history of other endocrine, nutritional and metabolic disease: Secondary | ICD-10-CM | POA: Insufficient documentation

## 2013-01-31 DIAGNOSIS — Z8709 Personal history of other diseases of the respiratory system: Secondary | ICD-10-CM | POA: Insufficient documentation

## 2013-01-31 DIAGNOSIS — Z87891 Personal history of nicotine dependence: Secondary | ICD-10-CM | POA: Insufficient documentation

## 2013-01-31 DIAGNOSIS — N390 Urinary tract infection, site not specified: Secondary | ICD-10-CM | POA: Insufficient documentation

## 2013-01-31 DIAGNOSIS — I10 Essential (primary) hypertension: Secondary | ICD-10-CM | POA: Insufficient documentation

## 2013-01-31 DIAGNOSIS — Z8739 Personal history of other diseases of the musculoskeletal system and connective tissue: Secondary | ICD-10-CM | POA: Insufficient documentation

## 2013-01-31 DIAGNOSIS — Z8719 Personal history of other diseases of the digestive system: Secondary | ICD-10-CM | POA: Insufficient documentation

## 2013-01-31 DIAGNOSIS — Z862 Personal history of diseases of the blood and blood-forming organs and certain disorders involving the immune mechanism: Secondary | ICD-10-CM | POA: Insufficient documentation

## 2013-01-31 DIAGNOSIS — Z4800 Encounter for change or removal of nonsurgical wound dressing: Secondary | ICD-10-CM | POA: Insufficient documentation

## 2013-01-31 LAB — URINE MICROSCOPIC-ADD ON

## 2013-01-31 LAB — CBC WITH DIFFERENTIAL/PLATELET
Basophils Absolute: 0 10*3/uL (ref 0.0–0.1)
Basophils Relative: 0 % (ref 0–1)
MCHC: 33.7 g/dL (ref 30.0–36.0)
Neutro Abs: 4.3 10*3/uL (ref 1.7–7.7)
Neutrophils Relative %: 77 % (ref 43–77)
Platelets: 254 10*3/uL (ref 150–400)
RDW: 13.8 % (ref 11.5–15.5)
WBC: 5.6 10*3/uL (ref 4.0–10.5)

## 2013-01-31 LAB — COMPREHENSIVE METABOLIC PANEL
ALT: 33 U/L (ref 0–35)
AST: 29 U/L (ref 0–37)
Albumin: 3.3 g/dL — ABNORMAL LOW (ref 3.5–5.2)
Alkaline Phosphatase: 194 U/L — ABNORMAL HIGH (ref 39–117)
Chloride: 102 mEq/L (ref 96–112)
Creatinine, Ser: 0.81 mg/dL (ref 0.50–1.10)
Potassium: 3.6 mEq/L (ref 3.5–5.1)
Sodium: 140 mEq/L (ref 135–145)
Total Bilirubin: 0.2 mg/dL — ABNORMAL LOW (ref 0.3–1.2)

## 2013-01-31 LAB — URINALYSIS, ROUTINE W REFLEX MICROSCOPIC
Glucose, UA: NEGATIVE mg/dL
Protein, ur: NEGATIVE mg/dL

## 2013-01-31 MED ORDER — ONDANSETRON HCL 4 MG/2ML IJ SOLN
4.0000 mg | Freq: Once | INTRAMUSCULAR | Status: AC
  Administered 2013-01-31: 4 mg via INTRAVENOUS
  Filled 2013-01-31: qty 2

## 2013-01-31 MED ORDER — MORPHINE SULFATE 4 MG/ML IJ SOLN
4.0000 mg | Freq: Once | INTRAMUSCULAR | Status: AC
  Administered 2013-01-31: 4 mg via INTRAVENOUS
  Filled 2013-01-31: qty 1

## 2013-01-31 MED ORDER — SODIUM CHLORIDE 0.9 % IV BOLUS (SEPSIS)
1000.0000 mL | Freq: Once | INTRAVENOUS | Status: AC
  Administered 2013-01-31: 1000 mL via INTRAVENOUS

## 2013-01-31 NOTE — ED Notes (Signed)
Pt states she has a drain tube placed last November for Chron's disease and the drain is out since September but she continues to bleed from site and there is thick pink drainage at this,  Earlier today she states she was bleeding from site and it was running down her leg and making a trial to where she was walking.

## 2013-01-31 NOTE — ED Provider Notes (Signed)
MSE was initiated and I personally evaluated the patient and placed orders (if any) at  8:48 PM on January 31, 2013.  Patient had procedure done 1 year ago and is status post ileocolonic resection for Crohn's disease, post op abscess formation. She was being treated by Dr. Zettie Cooley and Dr. Loreta Ave but recently referred to [facility] to see a specialist. Saw the specialist 1 week ago who said she needs "injections or IV medications" She is unsure of what kind.   The patient presents to the ED today because her resection site on her right flank is draining a purulent bloody discharge. It has been doing this for the past year but since this past Sunday the drainage has significantly increased in quantity and now looks different. Yesterday she developed pain in her abdomen that was the worst she has had since surgery. She is also having diarrhea and has been feeling weak.  Orders: CBC, CMP, urinalysis, urine culture, IV saline 1 L bolus, Morphine 4mg , Zofran 4 mg.    The patient appears stable so that the remainder of the MSE may be completed by another provider.   Dorthula Matas, PA-C 01/31/13 2053

## 2013-02-01 ENCOUNTER — Emergency Department (HOSPITAL_COMMUNITY): Payer: Medicare Other

## 2013-02-01 MED ORDER — CEPHALEXIN 500 MG PO CAPS: 500.0000 mg | ORAL_CAPSULE | Freq: Four times a day (QID) | ORAL | Status: DC

## 2013-02-01 MED ORDER — CEPHALEXIN 500 MG PO CAPS
500.0000 mg | ORAL_CAPSULE | Freq: Once | ORAL | Status: AC
  Administered 2013-02-01: 500 mg via ORAL
  Filled 2013-02-01: qty 1

## 2013-02-01 MED ORDER — IOHEXOL 300 MG/ML  SOLN: 50.0000 mL | Freq: Once | INTRAMUSCULAR | Status: DC | PRN

## 2013-02-01 MED ORDER — HYDROCODONE-ACETAMINOPHEN 5-325 MG PO TABS
1.0000 | ORAL_TABLET | Freq: Once | ORAL | Status: AC
  Administered 2013-02-01: 1 via ORAL
  Filled 2013-02-01: qty 1

## 2013-02-01 MED ORDER — MORPHINE SULFATE 4 MG/ML IJ SOLN: 4.0000 mg | Freq: Once | INTRAMUSCULAR | Status: DC | PRN

## 2013-02-01 MED ORDER — HYDROCODONE-ACETAMINOPHEN 5-325 MG PO TABS: 1.0000 | ORAL_TABLET | ORAL | Status: DC | PRN

## 2013-02-01 NOTE — ED Provider Notes (Signed)
Medical screening examination/treatment/procedure(s) were performed by non-physician practitioner and as supervising physician I was immediately available for consultation/collaboration.  Charizma Gardiner M Onetta Spainhower, MD 02/01/13 0348 

## 2013-02-01 NOTE — ED Provider Notes (Signed)
CSN: [id number]     Arrival date & time 01/31/13  2013 History   First MD Initiated Contact with Patient 01/31/13 2314     Chief Complaint  Patient presents with  . Wound Check   (Consider location/radiation/quality/duration/timing/severity/associated sxs/prior Treatment) HPI  Patient had procedure done 1 year ago and is status post ileocolonic resection for Crohn's disease, post op abscess formation. She was being treated by Dr. Zettie Cooley and Dr. Loreta Ave but recently referred to [facility] to see a specialist. Saw the specialist 1 week ago who said she needs "injections or IV medications" She is unsure of what kind.  The patient presents to the ED today because her resection site on her right flank is draining a purulent bloody discharge. It has been doing this for the past year but since this past Sunday the drainage has significantly increased in quantity and now looks different. Yesterday she developed pain in her abdomen that was the worst she has had since surgery. She is also having diarrhea and has been feeling weak.  Past Medical History  Diagnosis Date  . Bowel perforation   . Crohn disease   . Hypertension   . Hyperlipemia   . Swelling of extremity, left   . Swelling of extremity, right   . Abdominal abscess     due to crohns disease  . Anemia   . Blood transfusion without reported diagnosis   . Osteoporosis   . Chills   . Fever   . Nasal congestion   . Sore throat   . Cough   . Wheezing   . Palpitations   . Abdominal distension   . Abdominal pain   . Rectal bleeding   . Constipation   . Blood in stool   . Diarrhea   . Nausea & vomiting   . Fainting   . Weakness   . Generalized headaches   . Bruises easily   . Enlarged lymph node   . Rash    Past Surgical History  Procedure Laterality Date  . Laparotomy  01/23/2012    Procedure: EXPLORATORY LAPAROTOMY;  Surgeon: Velora Heckler, MD;  Location: [facility];  Service: General;  Laterality: N/A;  lysis of adhesions resection  ileocolonic anastamosos with perforation  . Esophagogastroduodenoscopy  02/19/2012    Procedure: ESOPHAGOGASTRODUODENOSCOPY (EGD);  Surgeon: Theda Belfast, MD;  Location: [facility];  Service: Endoscopy;  Laterality: N/A;  . Ileocolonic resection     Family History  Problem Relation Age of Onset  . Hypertension Mother   . Heart disease Father   . Cancer Sister     breast   History  Substance Use Topics  . Smoking status: Former Smoker    Quit date: 03/22/1962  . Smokeless tobacco: Never Used  . Alcohol Use: No   OB History   Grav Para Term Preterm Abortions TAB SAB Ect Mult Living                 Review of Systems The patient denies anorexia, fever, weight loss,, vision loss, decreased hearing, hoarseness, chest pain, syncope, dyspnea on exertion, peripheral edema, balance deficits, hemoptysis, abdominal pain, melena, hematochezia, severe indigestion/heartburn, hematuria, incontinence, genital sores, muscle weakness, suspicious skin lesions, transient blindness, difficulty walking, depression, unusual weight change, abnormal bleeding, enlarged lymph nodes, angioedema, and breast masses.  Allergies  Tomato  Home Medications   Current Outpatient Rx  Name  Route  Sig  Dispense  Refill  . calcium carbonate (OS-CAL - DOSED IN MG OF ELEMENTAL  CALCIUM) 1250 MG tablet   Oral   Take 1 tablet by mouth daily with breakfast.         . olmesartan-hydrochlorothiazide (BENICAR HCT) 20-12.5 MG per tablet   Oral   Take 1 tablet by mouth daily.         Marland Kitchen PRESCRIPTION MEDICATION   Oral   Take 1 capsule by mouth daily. Ultra Flora Plus         . vitamin B-12 (CYANOCOBALAMIN) 1000 MCG tablet   Oral   Take 1,000 mcg by mouth daily.         . vitamin E 400 UNIT capsule   Oral   Take 400 Units by mouth daily.         . cephALEXin (KEFLEX) 500 MG capsule   Oral   Take 1 capsule (500 mg total) by mouth 4 (four) times daily.   40 capsule   0   . denosumab (PROLIA) 60  MG/ML SOLN injection   Subcutaneous   Inject 60 mg into the skin every 6 (six) months. Administer in upper arm, thigh, or abdomen         . HYDROcodone-acetaminophen (NORCO/VICODIN) 5-325 MG per tablet   Oral   Take 1-2 tablets by mouth every 4 (four) hours as needed for pain.   20 tablet   0    BP 163/74  Pulse 60  Temp(Src) 98.8 F (37.1 C) (Oral)  Resp 16  Ht 5\' 2"  (1.575 m)  Wt 142 lb (64.411 kg)  BMI 25.97 kg/m2  SpO2 100% Physical Exam  Nursing note and vitals reviewed. Constitutional: She appears well-developed and well-nourished. No distress.  HENT:  Head: Normocephalic and atraumatic.  Eyes: Pupils are equal, round, and reactive to light.  Neck: Normal range of motion. Neck supple.  Cardiovascular: Normal rate and regular rhythm.   Pulmonary/Chest: Effort normal.  Abdominal: Soft.    Neurological: She is alert.  Skin: Skin is warm and dry.    ED Course  Procedures (including critical care time) Labs Review Labs Reviewed  CBC WITH DIFFERENTIAL - Abnormal; Notable for the following:    Hemoglobin 11.6 (*)    HCT 34.4 (*)    All other components within normal limits  COMPREHENSIVE METABOLIC PANEL - Abnormal; Notable for the following:    Glucose, Bld 111 (*)    Albumin 3.3 (*)    Alkaline Phosphatase 194 (*)    Total Bilirubin 0.2 (*)    GFR calc non Af Amer 73 (*)    GFR calc Af Amer 85 (*)    All other components within normal limits  URINALYSIS, ROUTINE W REFLEX MICROSCOPIC - Abnormal; Notable for the following:    APPearance CLOUDY (*)    Specific Gravity, Urine 1.032 (*)    Hgb urine dipstick TRACE (*)    Leukocytes, UA MODERATE (*)    All other components within normal limits  URINE MICROSCOPIC-ADD ON - Abnormal; Notable for the following:    Squamous Epithelial / LPF MANY (*)    All other components within normal limits  URINE CULTURE   Imaging Review Dg Abd 2 Views  01/31/2013   CLINICAL DATA:  Left flank wound with bleeding and pain.  History perforation and Crohn's disease.  EXAM: ABDOMEN - 2 VIEW  COMPARISON:  Abdominal CT 07/22/2012  FINDINGS: Nonobstructive bowel gas pattern. No suspicious intra-abdominal mass effect or calcification. No pneumoperitoneum. Bowel sutures noted in the right abdomen. Clear lung bases. No acute osseous findings.  IMPRESSION:  Negative for obstruction or other acute abnormality.   Electronically Signed   By: Tiburcio Pea M.D.   On: 01/31/2013 23:29    EKG Interpretation   None       MDM   1. Visit for wound check   2. UTI (lower urinary tract infection)    Due to prolonged wait patient does not want CT scan and prefers to leave. She wanted [place] to stop the bleeding but the bleeding is from unknown cause. I have discussed case with Dr. Norlene Campbell and since patients WBC and hemoglobin are stable, as well as plain films showing no perforation or acute findings patient can go home but needs to follow-up with [facility] tomorrow.  Rx: keflex and vicodin  67 y.o.Nelani Schmelzle Schrodt's evaluation in the Emergency Department is complete. It has been determined that no acute conditions requiring further emergency intervention are present at this time. The patient/guardian have been advised of the diagnosis and plan. We have discussed signs and symptoms that warrant return to the ED, such as changes or worsening in symptoms.  Vital signs are stable at discharge. Filed Vitals:   01/31/13 2025  BP: 163/74  Pulse: 60  Temp: 98.8 F (37.1 C)  Resp: 16    Patient/guardian has voiced understanding and agreed to follow-up with the PCP or specialist.  I personally performed the services described in this documentation, which was scribed in my presence. The recorded information has been reviewed and is accurate.    Dorthula Matas, PA-C 02/01/13 [phone]

## 2013-02-02 LAB — URINE CULTURE

## 2013-02-02 NOTE — Telephone Encounter (Signed)
error 

## 2013-02-07 NOTE — ED Provider Notes (Signed)
Medical screening examination/treatment/procedure(s) were performed by non-physician practitioner and as supervising physician I was immediately available for consultation/collaboration.  Jonthan Leite, MD 02/07/13 1440 

## 2013-02-16 ENCOUNTER — Other Ambulatory Visit: Payer: Self-pay

## 2013-02-16 DIAGNOSIS — Z1231 Encounter for screening mammogram for malignant neoplasm of breast: Secondary | ICD-10-CM

## 2013-03-21 ENCOUNTER — Ambulatory Visit
Admission: RE | Admit: 2013-03-21 | Discharge: 2013-03-21 | Disposition: A | Discharge status: Home / Self Care | Payer: Medicare Other | Source: Ambulatory Visit

## 2013-03-21 DIAGNOSIS — Z1231 Encounter for screening mammogram for malignant neoplasm of breast: Secondary | ICD-10-CM

## 2013-07-25 ENCOUNTER — Other Ambulatory Visit: Payer: Self-pay | Admitting: Internal Medicine

## 2013-07-25 DIAGNOSIS — M810 Age-related osteoporosis without current pathological fracture: Secondary | ICD-10-CM

## 2013-07-31 ENCOUNTER — Ambulatory Visit
Admission: RE | Admit: 2013-07-31 | Discharge: 2013-07-31 | Disposition: A | Discharge status: Home / Self Care | Payer: Medicare Other | Source: Ambulatory Visit | Attending: Internal Medicine | Admitting: Internal Medicine

## 2013-07-31 DIAGNOSIS — M810 Age-related osteoporosis without current pathological fracture: Secondary | ICD-10-CM

## 2013-11-02 ENCOUNTER — Other Ambulatory Visit: Payer: Self-pay | Admitting: Internal Medicine

## 2013-11-02 DIAGNOSIS — R52 Pain, unspecified: Secondary | ICD-10-CM

## 2013-11-02 DIAGNOSIS — R609 Edema, unspecified: Secondary | ICD-10-CM

## 2013-11-07 ENCOUNTER — Other Ambulatory Visit: Payer: Medicare Other

## 2013-11-16 ENCOUNTER — Ambulatory Visit
Admission: RE | Admit: 2013-11-16 | Discharge: 2013-11-16 | Disposition: A | Discharge status: Home / Self Care | Payer: Medicare Other | Source: Ambulatory Visit | Attending: Internal Medicine | Admitting: Internal Medicine

## 2013-11-16 DIAGNOSIS — R609 Edema, unspecified: Secondary | ICD-10-CM

## 2013-11-16 DIAGNOSIS — R52 Pain, unspecified: Secondary | ICD-10-CM

## 2014-02-27 ENCOUNTER — Other Ambulatory Visit: Payer: Self-pay

## 2014-02-27 DIAGNOSIS — Z1231 Encounter for screening mammogram for malignant neoplasm of breast: Secondary | ICD-10-CM

## 2014-03-22 ENCOUNTER — Emergency Department (HOSPITAL_COMMUNITY)
Admission: EM | Admit: 2014-03-22 | Discharge: 2014-03-22 | Disposition: A | Discharge status: Home / Self Care | Payer: Medicare Other | Attending: Emergency Medicine | Admitting: Emergency Medicine

## 2014-03-22 ENCOUNTER — Encounter (HOSPITAL_COMMUNITY): Payer: Self-pay | Admitting: Emergency Medicine

## 2014-03-22 ENCOUNTER — Telehealth (INDEPENDENT_AMBULATORY_CARE_PROVIDER_SITE_OTHER): Payer: Self-pay | Admitting: Surgery

## 2014-03-22 ENCOUNTER — Encounter (INDEPENDENT_AMBULATORY_CARE_PROVIDER_SITE_OTHER): Payer: Self-pay | Admitting: Surgery

## 2014-03-22 ENCOUNTER — Emergency Department (HOSPITAL_COMMUNITY): Payer: Medicare Other

## 2014-03-22 DIAGNOSIS — M810 Age-related osteoporosis without current pathological fracture: Secondary | ICD-10-CM | POA: Diagnosis not present

## 2014-03-22 DIAGNOSIS — Z862 Personal history of diseases of the blood and blood-forming organs and certain disorders involving the immune mechanism: Secondary | ICD-10-CM | POA: Diagnosis not present

## 2014-03-22 DIAGNOSIS — Z79899 Other long term (current) drug therapy: Secondary | ICD-10-CM | POA: Diagnosis not present

## 2014-03-22 DIAGNOSIS — E785 Hyperlipidemia, unspecified: Secondary | ICD-10-CM | POA: Diagnosis not present

## 2014-03-22 DIAGNOSIS — Y838 Other surgical procedures as the cause of abnormal reaction of the patient, or of later complication, without mention of misadventure at the time of the procedure: Secondary | ICD-10-CM | POA: Insufficient documentation

## 2014-03-22 DIAGNOSIS — Z87891 Personal history of nicotine dependence: Secondary | ICD-10-CM | POA: Insufficient documentation

## 2014-03-22 DIAGNOSIS — I10 Essential (primary) hypertension: Secondary | ICD-10-CM | POA: Diagnosis not present

## 2014-03-22 DIAGNOSIS — T814XXA Infection following a procedure, initial encounter: Secondary | ICD-10-CM | POA: Insufficient documentation

## 2014-03-22 DIAGNOSIS — Z8709 Personal history of other diseases of the respiratory system: Secondary | ICD-10-CM | POA: Insufficient documentation

## 2014-03-22 DIAGNOSIS — K5090 Crohn's disease, unspecified, without complications: Secondary | ICD-10-CM | POA: Insufficient documentation

## 2014-03-22 DIAGNOSIS — T798XXA Other early complications of trauma, initial encounter: Secondary | ICD-10-CM

## 2014-03-22 LAB — COMPREHENSIVE METABOLIC PANEL
ALBUMIN: 3.4 g/dL — AB (ref 3.5–5.2)
ALT: 38 U/L — ABNORMAL HIGH (ref 0–35)
ANION GAP: 14 (ref 5–15)
AST: 37 U/L (ref 0–37)
Alkaline Phosphatase: 156 U/L — ABNORMAL HIGH (ref 39–117)
BILIRUBIN TOTAL: 0.6 mg/dL (ref 0.3–1.2)
BUN: 19 mg/dL (ref 6–23)
CHLORIDE: 98 meq/L (ref 96–112)
CO2: 25 mEq/L (ref 19–32)
Calcium: 9.4 mg/dL (ref 8.4–10.5)
Creatinine, Ser: 0.84 mg/dL (ref 0.50–1.10)
GFR calc Af Amer: 80 mL/min — ABNORMAL LOW (ref >90)
GFR calc non Af Amer: 69 mL/min — ABNORMAL LOW (ref >90)
GLUCOSE: 109 mg/dL — AB (ref 70–99)
POTASSIUM: 4 meq/L (ref 3.7–5.3)
Sodium: 137 mEq/L (ref 137–147)
Total Protein: 8.3 g/dL (ref 6.0–8.3)

## 2014-03-22 LAB — URINE MICROSCOPIC-ADD ON

## 2014-03-22 LAB — CBC WITH DIFFERENTIAL/PLATELET
Basophils Absolute: 0 10*3/uL (ref 0.0–0.1)
Basophils Relative: 0 % (ref 0–1)
Eosinophils Absolute: 0 10*3/uL (ref 0.0–0.7)
Eosinophils Relative: 0 % (ref 0–5)
HCT: 37.3 % (ref 36.0–46.0)
HEMOGLOBIN: 12.5 g/dL (ref 12.0–15.0)
LYMPHS ABS: 1.1 10*3/uL (ref 0.7–4.0)
LYMPHS PCT: 15 % (ref 12–46)
MCH: 28.4 pg (ref 26.0–34.0)
MCHC: 33.5 g/dL (ref 30.0–36.0)
MCV: 84.8 fL (ref 78.0–100.0)
MONOS PCT: 10 % (ref 3–12)
Monocytes Absolute: 0.7 10*3/uL (ref 0.1–1.0)
NEUTROS ABS: 5.3 10*3/uL (ref 1.7–7.7)
NEUTROS PCT: 75 % (ref 43–77)
Platelets: 337 10*3/uL (ref 150–400)
RBC: 4.4 MIL/uL (ref 3.87–5.11)
RDW: 13.5 % (ref 11.5–15.5)
WBC: 7.2 10*3/uL (ref 4.0–10.5)

## 2014-03-22 LAB — URINALYSIS, ROUTINE W REFLEX MICROSCOPIC
Glucose, UA: NEGATIVE mg/dL
Ketones, ur: NEGATIVE mg/dL
Nitrite: NEGATIVE
Protein, ur: NEGATIVE mg/dL
SPECIFIC GRAVITY, URINE: 1.028 (ref 1.005–1.030)
UROBILINOGEN UA: 1 mg/dL (ref 0.0–1.0)
pH: 5 (ref 5.0–8.0)

## 2014-03-22 LAB — LIPASE, BLOOD: Lipase: 12 U/L (ref 11–59)

## 2014-03-22 MED ORDER — FENTANYL CITRATE 0.05 MG/ML IJ SOLN
50.0000 ug | Freq: Once | INTRAMUSCULAR | Status: AC
  Administered 2014-03-22: 50 ug via INTRAVENOUS
  Filled 2014-03-22: qty 2

## 2014-03-22 MED ORDER — CEFTRIAXONE SODIUM 1 G IJ SOLR
1.0000 g | Freq: Once | INTRAMUSCULAR | Status: AC
  Administered 2014-03-22: 1 g via INTRAVENOUS
  Filled 2014-03-22: qty 10

## 2014-03-22 MED ORDER — CEPHALEXIN 500 MG PO CAPS: 500.0000 mg | ORAL_CAPSULE | Freq: Four times a day (QID) | ORAL | Status: AC

## 2014-03-22 MED ORDER — HYDROCODONE-ACETAMINOPHEN 5-325 MG PO TABS: 1.0000 | ORAL_TABLET | ORAL | Status: AC | PRN

## 2014-03-22 MED ORDER — SODIUM CHLORIDE 0.9 % IV SOLN
1000.0000 mL | Freq: Once | INTRAVENOUS | Status: AC
  Administered 2014-03-22: 1000 mL via INTRAVENOUS

## 2014-03-22 NOTE — Discharge Instructions (Signed)
As discussed, it is important that you follow up as soon as possible with your physician for continued management of your condition. ° °If you develop any new, or concerning changes in your condition, please return to the emergency department immediately. ° °

## 2014-03-22 NOTE — ED Notes (Signed)
Pt from home, pt c/o bleeding wound on R lower back. Pt previously had drain put in after a procedure to treat her IBS in 2013. Pt sts drain wound had been "healed for awhile" but that on thanksgiving she noticed a bump full of pus appear where the drain used to be. Pt sts the wound continuously bleeds and has a very strong, foul odor. Pt A&Ox4. Ambulatory to triage.

## 2014-03-22 NOTE — Telephone Encounter (Signed)
Michele PickerelCatherine S David  [birth year] [id number]  Patient Care Team: Ralene Okoy Moreira, MD as PCP - General (Internal Medicine) Durenda HurtGregory Waters, MD as Consulting Physician (Colon and Rectal Surgery) Angelyn PuntJoel Bruggen, MD as Consulting Physician (Gastroenterology)  This patient is a 68 y.o.female   Reason for call: Advice in Crohn's patient  Patient with Crohn's disease.  Required ileal resection 20 years ago.  Developed perforation and peritonitis and required emergency ileocolonic resection 2 years ago.  Developed abscess.  Develop chronic ileocutaneous fistula.  Lasted for several months.  [name] given complexity.  Patient refused surgery.  Modification made in immunomodulator therapy.  Fistula healed on Humira.  [facility] follow[name].    Apparently patient has had some pain and drainage at the old drain site starting this week.  Scant yellow drainage.  Chronic scarring.  No feculence.  No major cellulitis.  No erythema.  CT scan shows some inflammation but no definite abscess gas or fluid collection.  ED MD Dr Hermina StaggersLookwood had concerns & wished device.  Chart and films reviewed extensively.  Patient has refused surgery numerous times in the past.  She has always wished to have medical modulation done first.  I think it would be a good idea the patient returned [facility]ology [name] to see if further interventions or evaluations are needed and to prove or disprove recurrent fistula.  Consider adjustment in immunosuppressive therapy.    If the fistula not resolved with immunomodulation, may need resection.  This had been offered by colorectal surgeon, Dr. Mirian MoGreg Waters there.  Because the patient is not toxic, can be transition with close [facility].  Questions answered.  Dr. Jeraldine LootsLockwood agreed.  Patient Active Problem List   Diagnosis Date Noted  . Abdominal abscess 02/06/2012  . Crohn's disease of small intestine with complication 01/23/2012     Past Medical History  Diagnosis Date  . Bowel perforation   . Crohn disease   . Hypertension   . Hyperlipemia   . Swelling of extremity, left   . Swelling of extremity, right   . Abdominal abscess     due to crohns disease  . Anemia   . Blood transfusion without reported diagnosis   . Osteoporosis   . Chills   . Fever   . Nasal congestion   . Sore throat   . Cough   . Wheezing   . Palpitations   . Abdominal distension   . Abdominal pain   . Rectal bleeding   . Constipation   . Blood in stool   . Diarrhea   . Nausea & vomiting   . Fainting   . Weakness   . Generalized headaches   . Bruises easily   . Enlarged lymph node   . Rash     Past Surgical History  Procedure Laterality Date  . Laparotomy  01/23/2012    Procedure: EXPLORATORY LAPAROTOMY;  Surgeon: Velora Hecklerodd M Gerkin, MD;  Location: [facility];  Service: General;  Laterality: N/A;  lysis of adhesions resection ileocolonic anastamosos with perforation  . Esophagogastroduodenoscopy  02/19/2012    Procedure: ESOPHAGOGASTRODUODENOSCOPY (EGD);  Surgeon: Theda BelfastPatrick D Hung, MD;  [facility];  Service: Endoscopy;  Laterality: N/A;  . Ileocolonic resection      History   Social History  . Marital Status: Single    Spouse Name: N/A    Number of Children: N/A  . Years of Education: N/A   Occupational History  . Not on  file.   Social History Main Topics  . Smoking status: Former Smoker    Quit date: 03/22/1962  . Smokeless tobacco: Never Used  . Alcohol Use: No  . Drug Use: No  . Sexual Activity: Not on file   Other Topics Concern  . Not on file   Social History Narrative    Family History  Problem Relation Age of Onset  . Hypertension Mother   . Heart disease Father   . Cancer Sister     breast    Current Outpatient Prescriptions  Medication Sig Dispense Refill  . Adalimumab (HUMIRA) 40 MG/0.8ML PSKT Inject 0.8 mLs into the skin every 14 (fourteen) days.    . Calcium Carbonate-Vitamin D  600-200 MG-UNIT TABS Take 1 tablet by mouth daily.    . cephALEXin (KEFLEX) 500 MG capsule Take 1 capsule (500 mg total) by mouth 4 (four) times daily. (Patient not taking: Reported on 03/22/2014) 40 capsule 0  . denosumab (PROLIA) 60 MG/ML SOLN injection Inject 60 mg into the skin every 6 (six) months. Administer in upper arm, thigh, or abdomen    . HYDROcodone-acetaminophen (NORCO/VICODIN) 5-325 MG per tablet Take 1-2 tablets by mouth every 4 (four) hours as needed for pain. 20 tablet 0  . olmesartan-hydrochlorothiazide (BENICAR HCT) 20-12.5 MG per tablet Take 1 tablet by mouth daily as needed (hbp).     . vitamin B-12 (CYANOCOBALAMIN) 1000 MCG tablet Take 1,000 mcg by mouth daily.    . vitamin E 400 UNIT capsule Take 400 Units by mouth daily.     No current facility-administered medications for this visit.     Allergies  Allergen Reactions  . Tomato Other (See Comments)    Reaction: caused bumps in her mouth    @VS @  Ct Abdomen Pelvis Wo Contrast  03/22/2014   CLINICAL DATA:  Draining lesion in right flank region.  EXAM: CT ABDOMEN AND PELVIS WITHOUT CONTRAST  TECHNIQUE: Multidetector CT imaging of the abdomen and pelvis was performed following the standard protocol without IV contrast.  COMPARISON:  CT scan of July 22, 2012.  FINDINGS: Visualized lung bases appear normal. No significant osseous abnormality is noted.  No gallstones are noted. No focal abnormality is seen in the liver, spleen or pancreas on these unenhanced images. Adrenal glands appear normal. No hydronephrosis or renal obstruction is noted. No renal or ureteral calculi are noted. Uterus and urinary bladder appear normal. There is no evidence of bowel obstruction.  There is noted soft tissue density seen in the subcutaneous tissues of the right posterior flank which expands through the posterior musculature and appears to surround several loops of distal ileum. No significant fluid collection or abscess is seen at this time.  This abnormal soft tissue density was present on the prior exam, but it does appear to be larger currently. Potentially this may represent ileocutaneous fistula.  IMPRESSION: There is again noted soft tissue density extending through the subcutaneous tissues of the right posterior flank into the peritoneal space in the right lower quadrant and surrounding several loops of distal ileum. This was present on the prior exam, but appears to be enlarged. It may represent scar tissue from prior procedures, but also may represent ileocutaneous fistula. No large fluid collection or abscess is seen at this time.   Electronically Signed   By: Roque Lias M.D.   On: 03/22/2014 20:27    Note: This dictation was prepared with Dragon/digital dictation along with [employer]. Any transcriptional errors that result from this  process are unintentional.

## 2014-03-22 NOTE — ED Notes (Signed)
Bedside report received from previous RN, Ajsa.

## 2014-03-22 NOTE — ED Provider Notes (Signed)
CSN: [id number]     Arrival date & time 03/22/14  1538 History   First MD Initiated Contact with Patient 03/22/14 1649     Chief Complaint  Patient presents with  . Wound Infection      HPI  Patient presents with concern of pain, drainage from a wound on her right flank. This began approximately 4 days ago.  She has a history of IBS, was previously on Humira. After an exacerbation approximately 2 years ago the patient had a percutaneous drain placed.  After the drain was removed, the wound healed, but over the past days a new wound has appeared in the same area, now actively draining pus. Patient has mild intermittent ongoing, new abdominal pain as well. No new fever, vomiting, confusion, disorientation, chest pain, and other concerns. No relief with anything.   Past Medical History  Diagnosis Date  . Bowel perforation   . Crohn disease   . Hypertension   . Hyperlipemia   . Swelling of extremity, left   . Swelling of extremity, right   . Abdominal abscess     due to crohns disease  . Anemia   . Blood transfusion without reported diagnosis   . Osteoporosis   . Chills   . Fever   . Nasal congestion   . Sore throat   . Cough   . Wheezing   . Palpitations   . Abdominal distension   . Abdominal pain   . Rectal bleeding   . Constipation   . Blood in stool   . Diarrhea   . Nausea & vomiting   . Fainting   . Weakness   . Generalized headaches   . Bruises easily   . Enlarged lymph node   . Rash   . Perforated viscus 01/23/2012  . 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 Laparotomy  01/23/2012    Procedure: EXPLORATORY LAPAROTOMY;  Surgeon: Velora Hecklerodd M Gerkin, MD;  Location: [facility];  Service: General;  Laterality: N/A;  lysis of adhesions resection ileocolonic anastamosos with perforation  . Esophagogastroduodenoscopy  02/19/2012    Procedure: ESOPHAGOGASTRODUODENOSCOPY (EGD);  Surgeon: Theda BelfastPatrick D  Hung, MD;  [facility];  Service: Endoscopy;  Laterality: N/A;  . Ileocolonic resection     Family History  Problem Relation Age of Onset  . Hypertension Mother   . Heart disease Father   . Cancer Sister     breast   History  Substance Use Topics  . Smoking status: Former Smoker    Quit date: 03/22/1962  . Smokeless tobacco: Never Used  . Alcohol Use: No   OB History    No data available     Review of Systems  Constitutional:       Per HPI, otherwise negative  HENT:       Per HPI, otherwise negative  Respiratory:       Per HPI, otherwise negative  Cardiovascular:       Per HPI, otherwise negative  Gastrointestinal: Negative for vomiting.  Endocrine:       Negative aside from HPI  Genitourinary:       Neg aside from HPI   Musculoskeletal:       Per HPI, otherwise negative  Skin: Positive for wound.  Neurological: Negative for syncope.      Allergies  Tomato  Home Medications   Prior to Admission medications   Medication Sig Start  Date End Date Taking? Authorizing Provider  Adalimumab (HUMIRA) 40 MG/0.8ML PSKT Inject 0.8 mLs into the skin every 14 (fourteen) days.   Yes Historical Provider, MD  Calcium Carbonate-Vitamin D 600-200 MG-UNIT TABS Take 1 tablet by mouth daily.   Yes Historical Provider, MD  olmesartan-hydrochlorothiazide (BENICAR HCT) 20-12.5 MG per tablet Take 1 tablet by mouth daily as needed (hbp).    Yes Historical Provider, MD  vitamin B-12 (CYANOCOBALAMIN) 1000 MCG tablet Take 1,000 mcg by mouth daily.   Yes Historical Provider, MD  vitamin E 400 UNIT capsule Take 400 Units by mouth daily.   Yes Historical Provider, MD  cephALEXin (KEFLEX) 500 MG capsule Take 1 capsule (500 mg total) by mouth 4 (four) times daily. 03/22/14   Gerhard Munch, MD  denosumab (PROLIA) 60 MG/ML SOLN injection Inject 60 mg into the skin every 6 (six) months. Administer in upper arm, thigh, or abdomen    Historical Provider, MD  HYDROcodone-acetaminophen  (NORCO/VICODIN) 5-325 MG per tablet Take 1-2 tablets by mouth every 4 (four) hours as needed. 03/22/14   Gerhard Munch, MD   BP 163/58 mmHg  Pulse 65  Temp(Src) 97.9 F (36.6 C) (Oral)  Resp 18  SpO2 100% Physical Exam  Constitutional: She is oriented to person, place, and time. She appears well-developed and well-nourished. No distress.  HENT:  Head: Normocephalic and atraumatic.  Eyes: Conjunctivae and EOM are normal.  Cardiovascular: Normal rate and regular rhythm.   Pulmonary/Chest: Effort normal and breath sounds normal. No stridor. No respiratory distress.  Abdominal: She exhibits no distension.    Musculoskeletal: She exhibits no edema.       Arms: Neurological: She is alert and oriented to person, place, and time. No cranial nerve deficit.  Skin: Skin is warm and dry.  Psychiatric: She has a normal mood and affect.  Nursing note and vitals reviewed.   ED Course  Procedures (including critical care time) Labs Review Labs Reviewed  COMPREHENSIVE METABOLIC PANEL - Abnormal; Notable for the following:    Glucose, Bld 109 (*)    Albumin 3.4 (*)    ALT 38 (*)    Alkaline Phosphatase 156 (*)    GFR calc non Af Amer 69 (*)    GFR calc Af Amer 80 (*)    All other components within normal limits  URINALYSIS, ROUTINE W REFLEX MICROSCOPIC - Abnormal; Notable for the following:    Color, Urine AMBER (*)    APPearance CLOUDY (*)    Hgb urine dipstick TRACE (*)    Bilirubin Urine SMALL (*)    Leukocytes, UA LARGE (*)    All other components within normal limits  URINE MICROSCOPIC-ADD ON - Abnormal; Notable for the following:    Squamous Epithelial / LPF MANY (*)    Bacteria, UA MANY (*)    All other components within normal limits  CBC WITH DIFFERENTIAL  LIPASE, BLOOD    Imaging Review Ct Abdomen Pelvis Wo Contrast  03/22/2014   CLINICAL DATA:  Draining lesion in right flank region.  EXAM: CT ABDOMEN AND PELVIS WITHOUT CONTRAST  TECHNIQUE: Multidetector CT imaging of  the abdomen and pelvis was performed following the standard protocol without IV contrast.  COMPARISON:  CT scan of July 22, 2012.  FINDINGS: Visualized lung bases appear normal. No significant osseous abnormality is noted.  No gallstones are noted. No focal abnormality is seen in the liver, spleen or pancreas on these unenhanced images. Adrenal glands appear normal. No hydronephrosis or renal obstruction is noted.  No renal or ureteral calculi are noted. Uterus and urinary bladder appear normal. There is no evidence of bowel obstruction.  There is noted soft tissue density seen in the subcutaneous tissues of the right posterior flank which expands through the posterior musculature and appears to surround several loops of distal ileum. No significant fluid collection or abscess is seen at this time. This abnormal soft tissue density was present on the prior exam, but it does appear to be larger currently. Potentially this may represent ileocutaneous fistula.  IMPRESSION: There is again noted soft tissue density extending through the subcutaneous tissues of the right posterior flank into the peritoneal space in the right lower quadrant and surrounding several loops of distal ileum. This was present on the prior exam, but appears to be enlarged. It may represent scar tissue from prior procedures, but also may represent ileocutaneous fistula. No large fluid collection or abscess is seen at this time.   Electronically Signed   By: Roque Lias M.D.   On: 03/22/2014 20:27   I discussed patient's CT results with our surgeon on-call, one of the patient's surgeons colleagues from emergency surgery 2 years ago. He advocates for follow-up with the patient's GI team, and he notes that the patient has refused additional surgical intervention multiple times.   On repeat exam the patient is substantially more comfortable. I discussed results again with the patient, and she again states that she is not currently interested in  any kind of surgical repair.   MDM   Final diagnoses:  Infected wound, initial encounter    This patient with history of Crohn's, prior emergency surgery, prior placement of percutaneous drainage tube now presents with infected wound.  No evidence for    peritonitis, bacteremia, sepsis, and the patient improved substantially in terms of pain control here.  with abnormal CT results, the patient was discharged after initiation of antibiotics here to follow up with her  Crohn's specialists at [facility].  Gerhard Munch, MD 03/23/14 [phone]

## 2014-03-27 ENCOUNTER — Ambulatory Visit
Admission: RE | Admit: 2014-03-27 | Discharge: 2014-03-27 | Disposition: A | Discharge status: Home / Self Care | Payer: Medicare Other | Source: Ambulatory Visit

## 2014-03-27 DIAGNOSIS — Z1231 Encounter for screening mammogram for malignant neoplasm of breast: Secondary | ICD-10-CM

## 2014-04-17 IMAGING — CR DG CHEST 2V
2 series · 2 of 2 positions shown · non-contrast
Comparison: 01/26/2012.

CLINICAL DATA: Short of breath.  Weakness.

CHEST - 2 VIEW

[w chest pa]
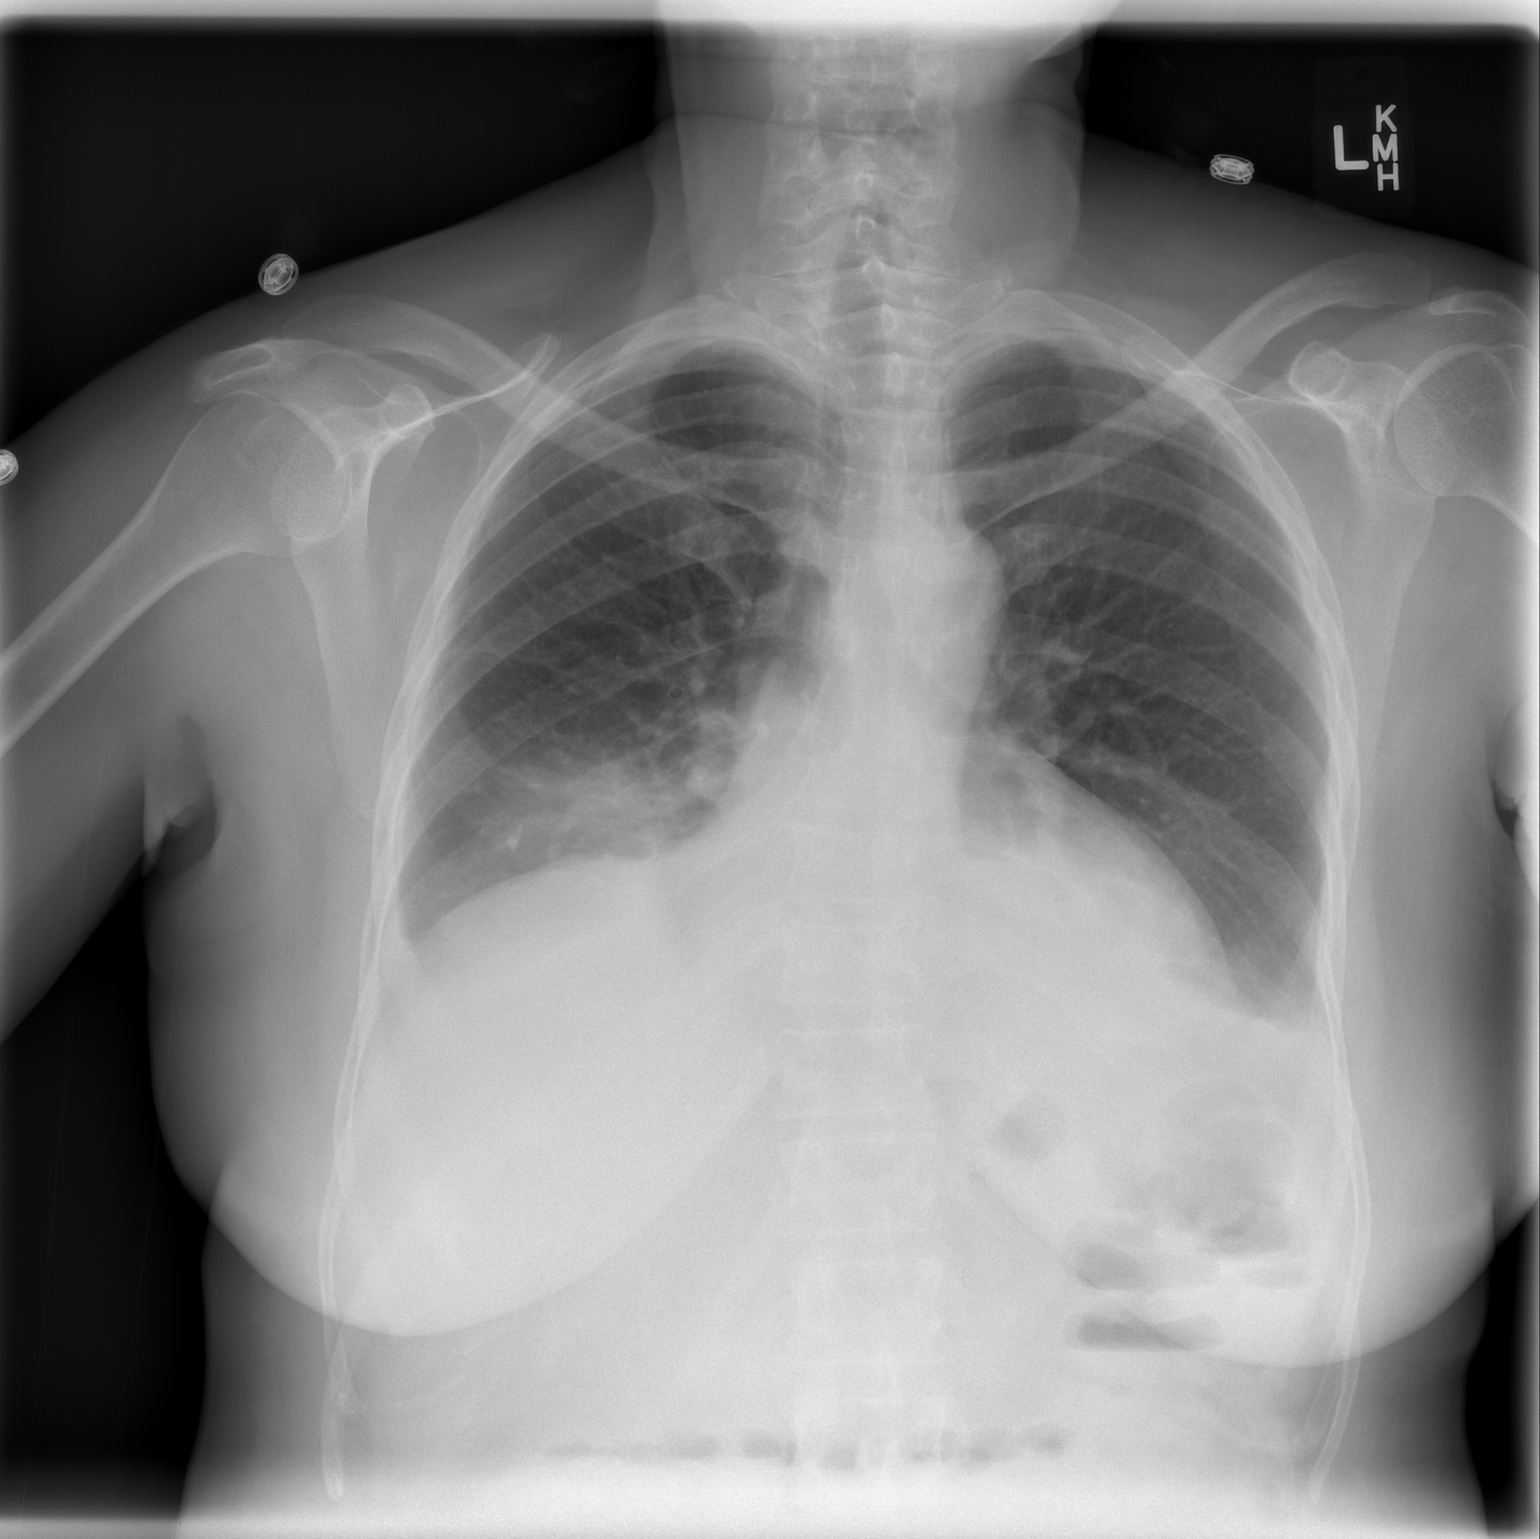

[w chest lat]
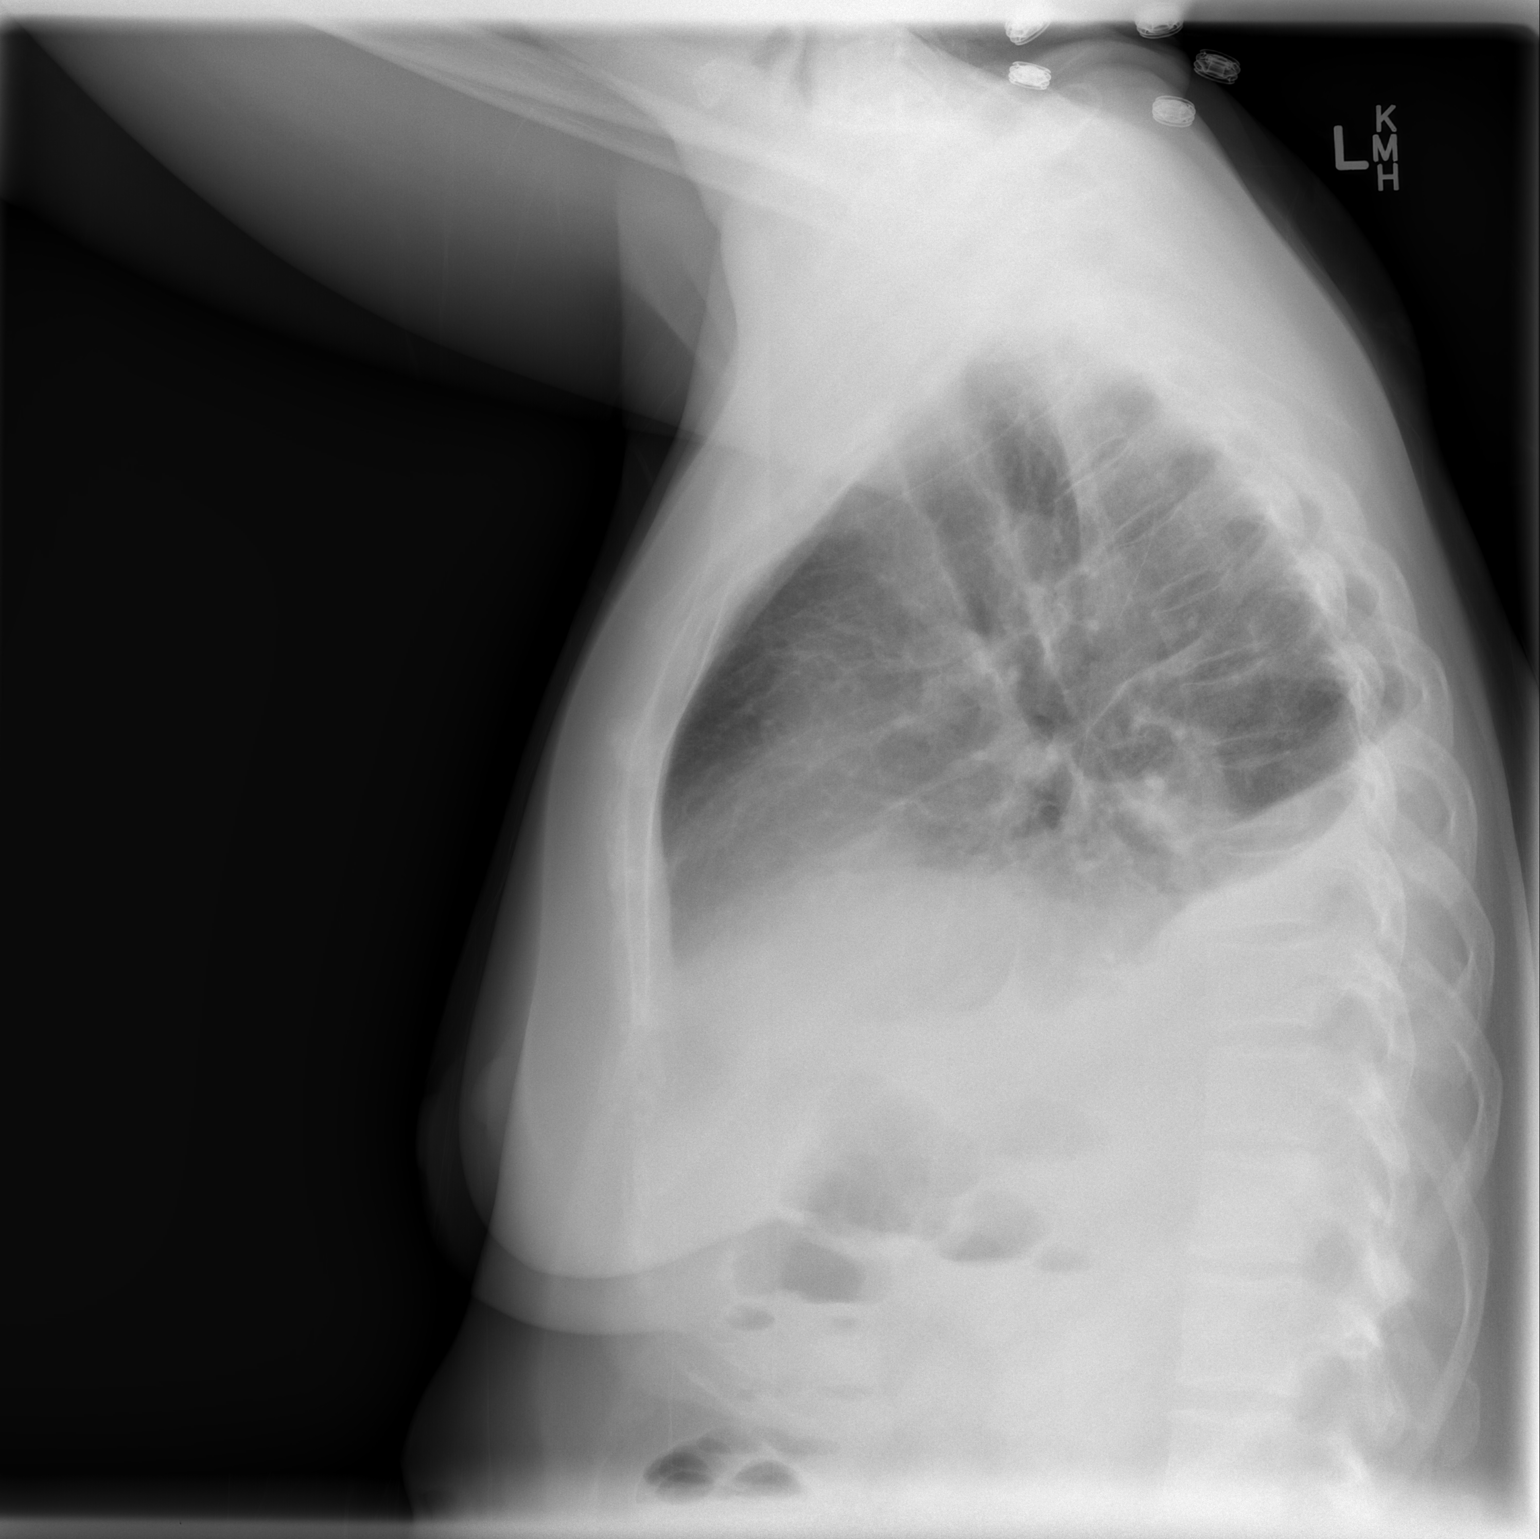

[2 of 2 positions shown; findings below may reference images not displayed]

FINDINGS: Interval removal of nasogastric tube.  Colonic air fluid
levels are present in the upper abdomen.  Elevation of the right
hemidiaphragm.  Persistent right basilar atelectasis or infiltrate.
Small left pleural effusion with blunting of the left costophrenic
angle.  Retrocardiac density most compatible with atelectasis and
effusion in combination.  Mediastinal contours unchanged.
IMPRESSION: 1.  Interval removal of nasogastric tube.
2.  Bilateral pleural effusions, greater on the right than left
with basilar collapse / consolidation.

## 2014-04-28 IMAGING — CT CT ABD-PELV W/ CM
1 of 2 series · 14 of 32 positions shown, 18 images · IV contrast (OMNIPAQUE)
Comparison: 02/07/2012 CT guided abscess drainage.  Preoperative
study 02/06/2012.

CLINICAL DATA: 66-year-old female status post abscess drainage.
Crohn disease.

CT ABDOMEN AND PELVIS WITH CONTRAST
TECHNIQUE: Multidetector CT imaging of the abdomen and pelvis was
performed following the standard protocol during bolus
administration of intravenous contrast.
Contrast: 100mL OMNIPAQUE IOHEXOL 300 MG/ML  SOLN

[Series 2: rtn ap with st · axial · 0.74mm/px · z∈[-482,-72]mm · 14 of 90 slices shown, 18 images]
[im 4/90  soft-tissue]
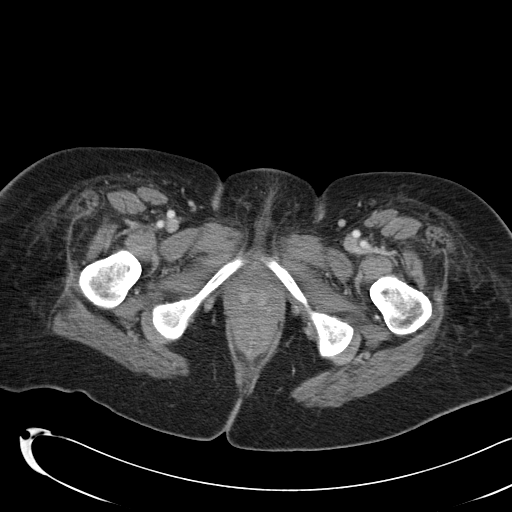
[im 4/90  bone]
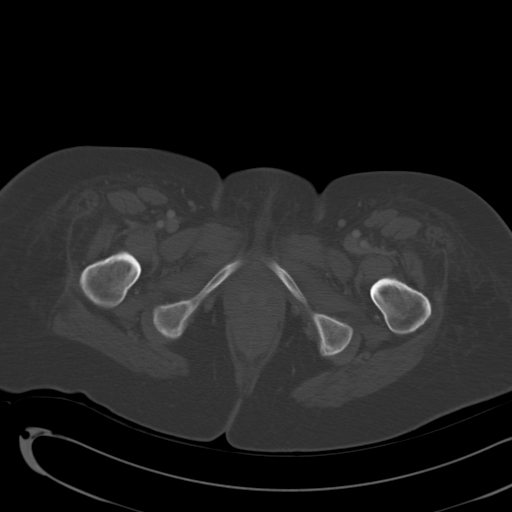
[im 12/90  soft-tissue]
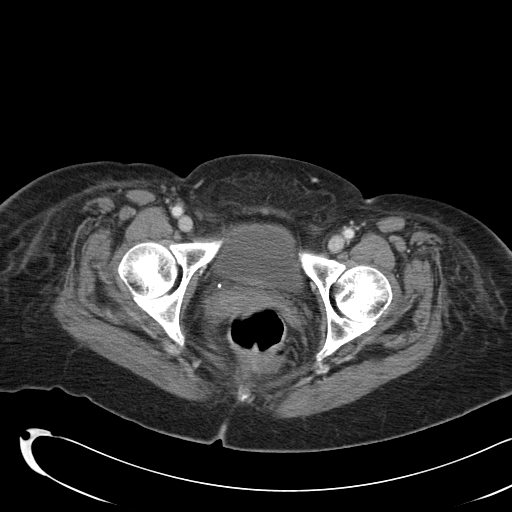
[im 20/90  soft-tissue]
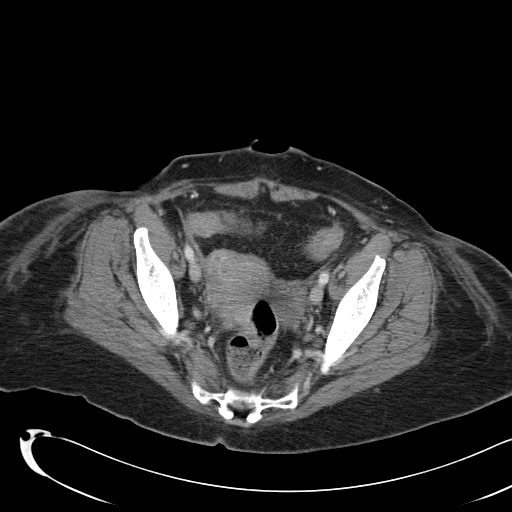
[im 28/90  soft-tissue]
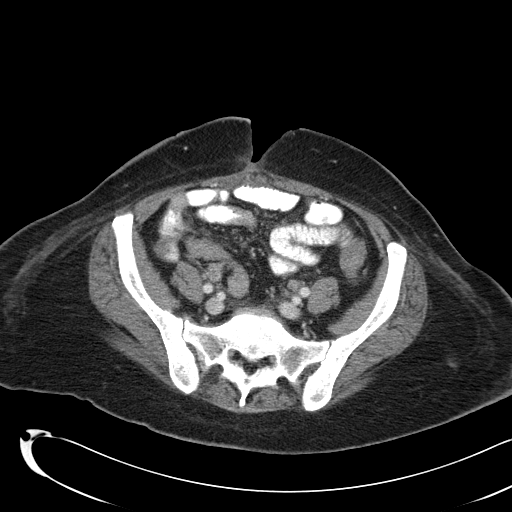
[im 35/90  soft-tissue]
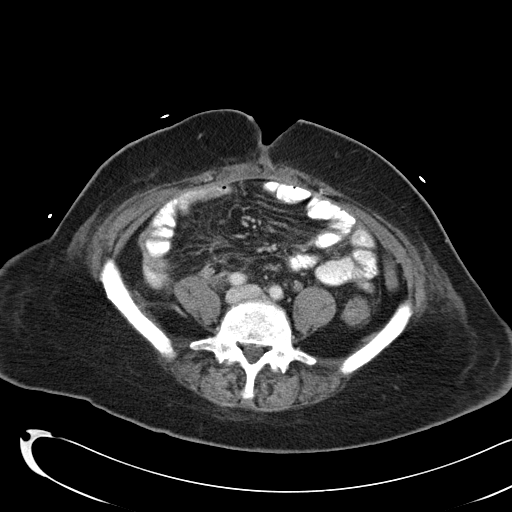
[im 43/90  soft-tissue]
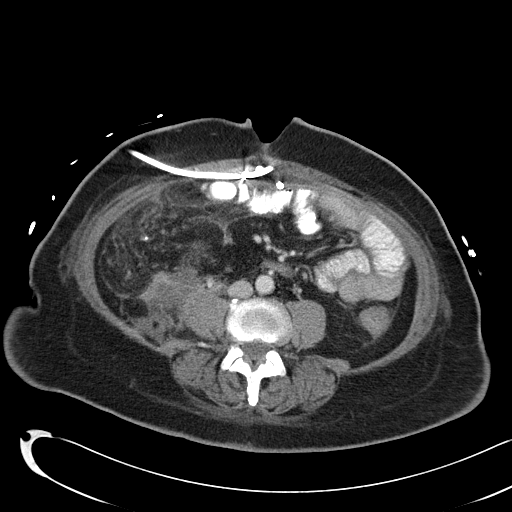
[im 47/90  soft-tissue]
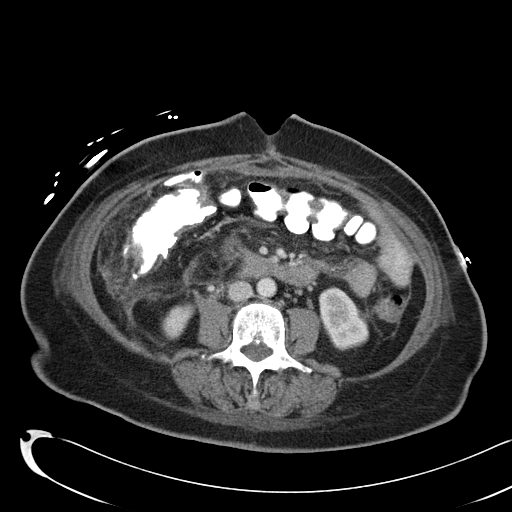
[im 55/90  soft-tissue]
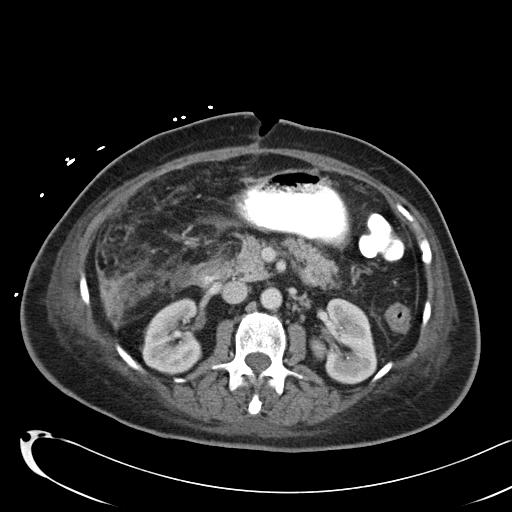
[im 62/90  soft-tissue]
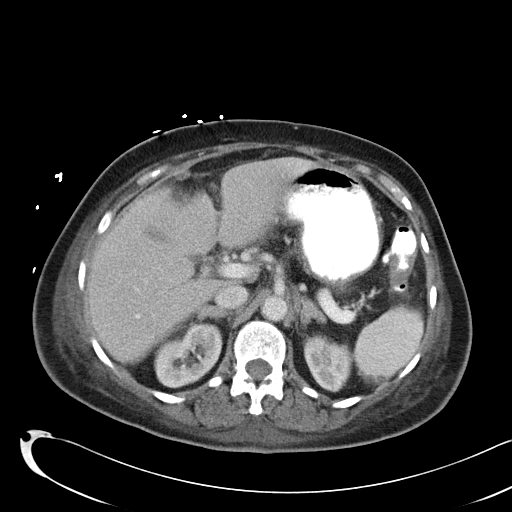
[im 62/90  bone]
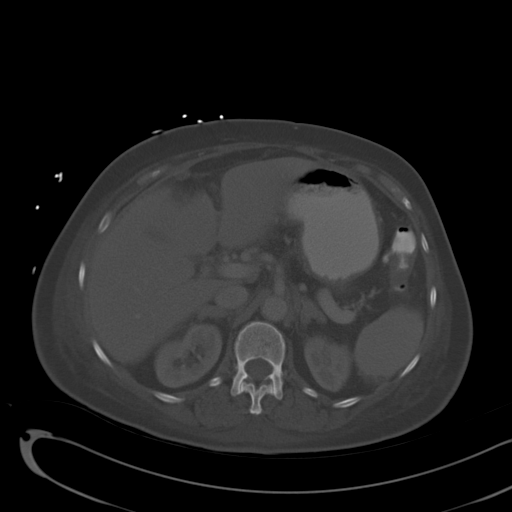
[im 70/90  soft-tissue]
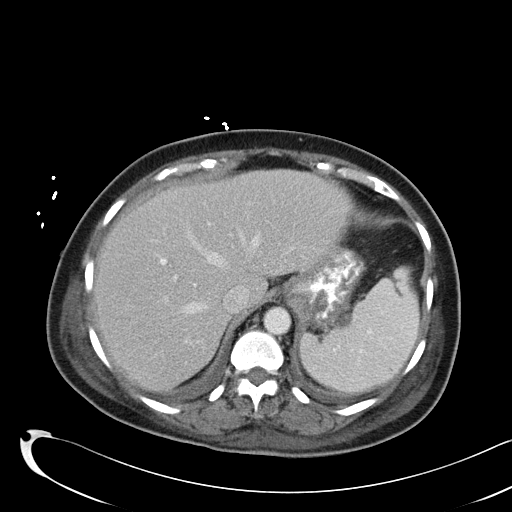
[im 74/90  lung]
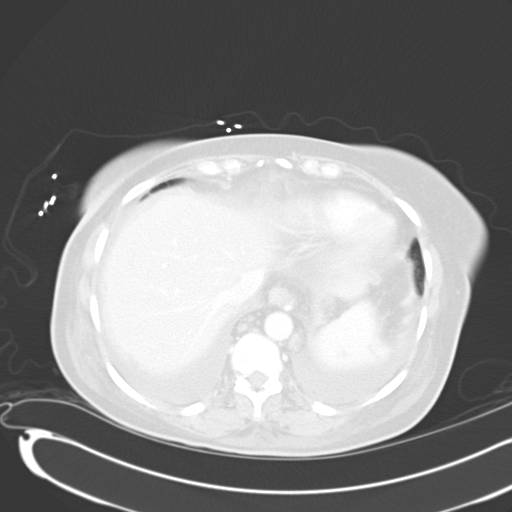
[im 78/90  soft-tissue]
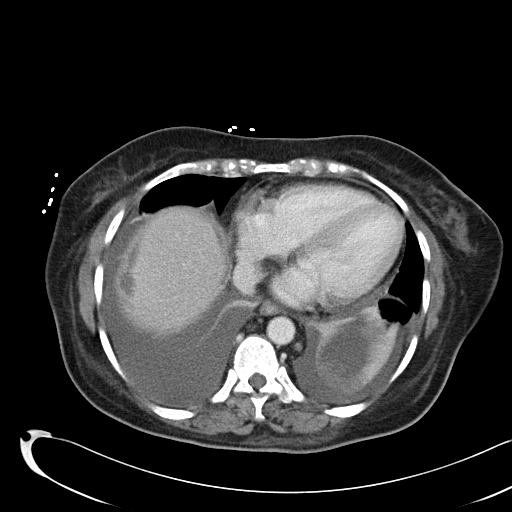
[im 78/90  lung]
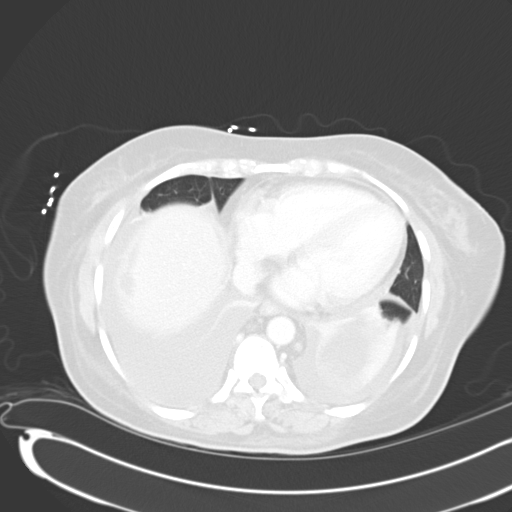
[im 82/90  lung]
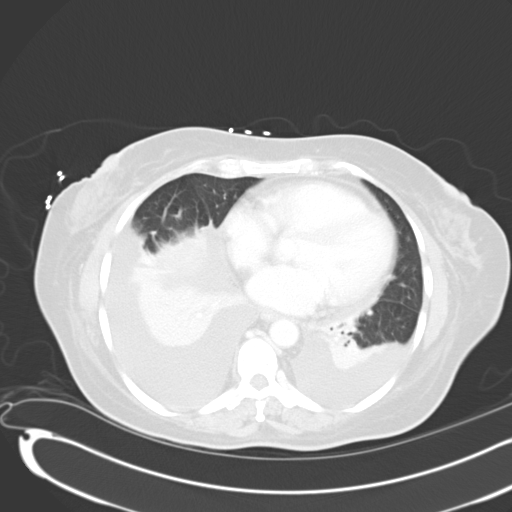
[im 86/90  soft-tissue]
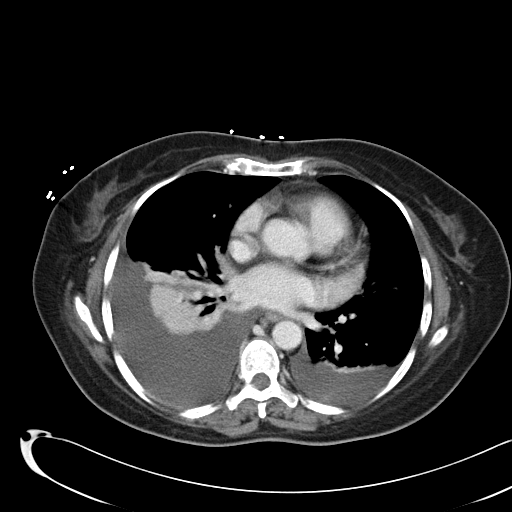
[im 86/90  lung]
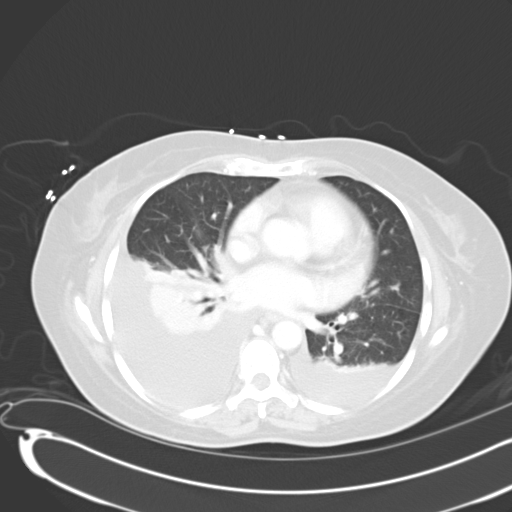

[14 of 32 positions shown; findings below may reference images not displayed]

FINDINGS: Increased layering right pleural effusion, now moderate
to large.  Stable small to moderate layering left pleural effusion.
No pericardial effusion.  Associated lower lobe collapse.

Stable visualized osseous structures.

Stable left adnexal cyst.  Stable mild presacral stranding or trace
fluid.  Fluid in the rectum.  Uterus and right adnexa are
unremarkable.  Continued mild wall thickening of the sigmoid colon
and descending colon.  Oral contrast has reached the splenic
flexure.  Percutaneous pigtail drainage catheter in place at the
level of the midline ventral peritoneal cavity abscess which has
decreased.  Small volume residual fluid noted (series 2 image 52).
There is no longer gas collection.  Postoperative changes to the
right colon.  Severe stranding in the right abdominal mesentery and
involving right abdominal small bowel has not significantly
changed.  Stable small bowel caliber.  Severe thickening and
inflammation of the gastric antrum and proximal duodenum has not
significantly changed.  Stranding at the porta hepatis and about
the gallbladder is stable.

Small volume.  Panic fluid appears similar.  No definite liver
abscess.  Mildly increased oval hypodensity in the spleen on series
2 image 19.  Previously this was felt to be infarct.  There are
several new smaller similar areas in the spleen, mostly along the
periphery.  Negative pancreas.  Stable adrenal hyperplasia.  Stable
kidneys without hydronephrosis.  Punctate nephrolithiasis.

Portal venous system is patent.  Major arterial structures in the
abdomen pelvis are patent.

Open ventral abdominal wound is stable.
IMPRESSION: 1.  Interval decreased ventral peritoneal cavity abscess with
percutaneous pigtail drain in place.  Small volume residual abscess
fluid.
2.  Increased peripheral hypodense areas in the spleen.  Previously
these were thought to be infarcts.  No findings highly suspicious
for splenic abscess at this time.
3.  Increased right pleural effusion now moderate to large.  Stable
small to moderate left pleural effusion.
4.  Trace perihepatic fluid.  No definite liver abscess.
5.  Extensive inflammation in the right abdomen has not
significantly changed.
6.  No bowel obstruction.

## 2014-05-08 ENCOUNTER — Encounter (HOSPITAL_COMMUNITY): Payer: Self-pay | Admitting: Emergency Medicine

## 2014-05-08 ENCOUNTER — Emergency Department (HOSPITAL_COMMUNITY)
Admission: EM | Admit: 2014-05-08 | Discharge: 2014-05-08 | Disposition: A | Discharge status: Home / Self Care | Payer: Medicare Other | Attending: Emergency Medicine | Admitting: Emergency Medicine

## 2014-05-08 ENCOUNTER — Emergency Department (HOSPITAL_COMMUNITY): Payer: Medicare Other

## 2014-05-08 DIAGNOSIS — D649 Anemia, unspecified: Secondary | ICD-10-CM | POA: Diagnosis not present

## 2014-05-08 DIAGNOSIS — K50013 Crohn's disease of small intestine with fistula: Secondary | ICD-10-CM | POA: Diagnosis not present

## 2014-05-08 DIAGNOSIS — Z79899 Other long term (current) drug therapy: Secondary | ICD-10-CM | POA: Diagnosis not present

## 2014-05-08 DIAGNOSIS — R109 Unspecified abdominal pain: Secondary | ICD-10-CM

## 2014-05-08 DIAGNOSIS — Z8709 Personal history of other diseases of the respiratory system: Secondary | ICD-10-CM | POA: Insufficient documentation

## 2014-05-08 DIAGNOSIS — Z792 Long term (current) use of antibiotics: Secondary | ICD-10-CM | POA: Insufficient documentation

## 2014-05-08 DIAGNOSIS — Z87891 Personal history of nicotine dependence: Secondary | ICD-10-CM | POA: Insufficient documentation

## 2014-05-08 DIAGNOSIS — Z8639 Personal history of other endocrine, nutritional and metabolic disease: Secondary | ICD-10-CM | POA: Insufficient documentation

## 2014-05-08 DIAGNOSIS — M810 Age-related osteoporosis without current pathological fracture: Secondary | ICD-10-CM | POA: Insufficient documentation

## 2014-05-08 DIAGNOSIS — K50019 Crohn's disease of small intestine with unspecified complications: Secondary | ICD-10-CM

## 2014-05-08 DIAGNOSIS — K50913 Crohn's disease, unspecified, with fistula: Secondary | ICD-10-CM

## 2014-05-08 DIAGNOSIS — Z791 Long term (current) use of non-steroidal anti-inflammatories (NSAID): Secondary | ICD-10-CM | POA: Diagnosis not present

## 2014-05-08 DIAGNOSIS — I10 Essential (primary) hypertension: Secondary | ICD-10-CM | POA: Diagnosis not present

## 2014-05-08 LAB — CBC WITH DIFFERENTIAL/PLATELET
Basophils Absolute: 0 10*3/uL (ref 0.0–0.1)
Basophils Relative: 0 % (ref 0–1)
EOS ABS: 0 10*3/uL (ref 0.0–0.7)
Eosinophils Relative: 0 % (ref 0–5)
HEMATOCRIT: 34.7 % — AB (ref 36.0–46.0)
HEMOGLOBIN: 11.3 g/dL — AB (ref 12.0–15.0)
LYMPHS ABS: 0.8 10*3/uL (ref 0.7–4.0)
Lymphocytes Relative: 13 % (ref 12–46)
MCH: 27.6 pg (ref 26.0–34.0)
MCHC: 32.6 g/dL (ref 30.0–36.0)
MCV: 84.6 fL (ref 78.0–100.0)
Monocytes Absolute: 0.7 10*3/uL (ref 0.1–1.0)
Monocytes Relative: 12 % (ref 3–12)
NEUTROS ABS: 4.6 10*3/uL (ref 1.7–7.7)
Neutrophils Relative %: 75 % (ref 43–77)
Platelets: 313 10*3/uL (ref 150–400)
RBC: 4.1 MIL/uL (ref 3.87–5.11)
RDW: 14.4 % (ref 11.5–15.5)
WBC: 6.1 10*3/uL (ref 4.0–10.5)

## 2014-05-08 LAB — BASIC METABOLIC PANEL
Anion gap: 6 (ref 5–15)
BUN: 18 mg/dL (ref 6–23)
CO2: 27 mmol/L (ref 19–32)
Calcium: 8.8 mg/dL (ref 8.4–10.5)
Chloride: 107 mEq/L (ref 96–112)
Creatinine, Ser: 0.71 mg/dL (ref 0.50–1.10)
GFR calc Af Amer: >90 mL/min (ref >90)
GFR, EST NON AFRICAN AMERICAN: 85 mL/min — AB (ref >90)
GLUCOSE: 132 mg/dL — AB (ref 70–99)
Potassium: 3.6 mmol/L (ref 3.5–5.1)
Sodium: 140 mmol/L (ref 135–145)

## 2014-05-08 MED ORDER — SODIUM CHLORIDE 0.9 % IV BOLUS (SEPSIS)
500.0000 mL | Freq: Once | INTRAVENOUS | Status: AC
  Administered 2014-05-08: 500 mL via INTRAVENOUS

## 2014-05-08 MED ORDER — IOHEXOL 300 MG/ML  SOLN
100.0000 mL | Freq: Once | INTRAMUSCULAR | Status: AC | PRN
  Administered 2014-05-08: 100 mL via INTRAVENOUS

## 2014-05-08 MED ORDER — IOHEXOL 300 MG/ML  SOLN
50.0000 mL | Freq: Once | INTRAMUSCULAR | Status: AC | PRN
  Administered 2014-05-08: 50 mL via ORAL

## 2014-05-08 MED ORDER — MORPHINE SULFATE 4 MG/ML IJ SOLN
4.0000 mg | Freq: Once | INTRAMUSCULAR | Status: AC
  Administered 2014-05-08: 4 mg via INTRAVENOUS
  Filled 2014-05-08: qty 1

## 2014-05-08 MED ORDER — HYDROCODONE-ACETAMINOPHEN 5-325 MG PO TABS: 1.0000 | ORAL_TABLET | ORAL | Status: AC | PRN

## 2014-05-08 NOTE — ED Provider Notes (Signed)
CSN: [id number]     Arrival date & time 05/08/14  1107 History   First MD Initiated Contact with Patient 05/08/14 1133     Chief Complaint  Patient presents with  . Cyst     (Consider location/radiation/quality/duration/timing/severity/associated sxs/prior Treatment) HPI Comments: 69 year old female with history of Crohn's disease, small bowel inflammation with fistula, abdominal abscess, surgery by Dr.Gerkin October 2013 followed by follow-up at [facility] presents with worsening draining in pain from right fistula site. Patient said mild nausea without vomiting, no fevers however significant drainage including blood and possible pus/yellow color. This feels similar to 2013 when she had an infection. Constipation symptoms recently. Her drainage is gradually worsened nothing to improve. Patient changes bandage regularly.  The history is provided by the patient.    Past Medical History  Diagnosis Date  . Bowel perforation   . Crohn disease   . Hypertension   . Hyperlipemia   . Swelling of extremity, left   . Swelling of extremity, right   . Abdominal abscess     due to crohns disease  . Anemia   . Blood transfusion without reported diagnosis   . Osteoporosis   . Chills   . Fever   . Nasal congestion   . Sore throat   . Cough   . Wheezing   . Palpitations   . Abdominal distension   . Abdominal pain   . Rectal bleeding   . Constipation   . Blood in stool   . Diarrhea   . Nausea & vomiting   . Fainting   . Weakness   . Generalized headaches   . Bruises easily   . Enlarged lymph node   . Rash   . Perforated viscus 01/23/2012  . 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 Laparotomy  01/23/2012    Procedure: EXPLORATORY LAPAROTOMY;  Surgeon: Velora Heckler, MD;  Location: [facility];  Service: General;  Laterality: N/A;  lysis of adhesions resection ileocolonic anastamosos with perforation  .  Esophagogastroduodenoscopy  02/19/2012    Procedure: ESOPHAGOGASTRODUODENOSCOPY (EGD);  Surgeon: Theda Belfast, MD;  Location: [facility];  Service: Endoscopy;  Laterality: N/A;  . Ileocolonic resection     Family History  Problem Relation Age of Onset  . Hypertension Mother   . Heart disease Father   . Cancer Sister     breast   History  Substance Use Topics  . Smoking status: Former Smoker    Quit date: 03/22/1962  . Smokeless tobacco: Never Used  . Alcohol Use: No   OB History    No data available     Review of Systems  Constitutional: Positive for appetite change. Negative for fever and chills.  HENT: Negative for congestion.   Eyes: Negative for visual disturbance.  Respiratory: Negative for shortness of breath.   Cardiovascular: Negative for chest pain.  Gastrointestinal: Positive for abdominal pain. Negative for vomiting.  Genitourinary: Positive for flank pain. Negative for dysuria.  Musculoskeletal: Negative for back pain, neck pain and neck stiffness.  Skin: Negative for rash.  Neurological: Positive for light-headedness. Negative for headaches.      Allergies  Tomato  Home Medications   Prior to Admission medications   Medication Sig Start Date End Date Taking? Authorizing Provider  Calcium Carbonate-Vitamin D 600-200 MG-UNIT TABS Take 1 tablet by mouth daily.   Yes Historical Provider, MD  HYDROcodone-acetaminophen (NORCO/VICODIN) 5-325  MG per tablet Take 1-2 tablets by mouth every 4 (four) hours as needed. 03/22/14  Yes Gerhard Munch, MD  metroNIDAZOLE (FLAGYL) 500 MG tablet Take 500 mg by mouth 3 (three) times daily.   Yes Historical Provider, MD  naproxen sodium (ANAPROX) 220 MG tablet Take 220 mg by mouth daily as needed (pain).   Yes Historical Provider, MD  olmesartan-hydrochlorothiazide (BENICAR HCT) 20-12.5 MG per tablet Take 1 tablet by mouth daily as needed (hbp).    Yes Historical Provider, MD  vitamin B-12 (CYANOCOBALAMIN) 1000 MCG tablet  Take 1,000 mcg by mouth daily.   Yes Historical Provider, MD  vitamin E 400 UNIT capsule Take 400 Units by mouth daily.   Yes Historical Provider, MD  Adalimumab (HUMIRA) 40 MG/0.8ML PSKT Inject 0.8 mLs into the skin once a week. On Fridays.    Historical Provider, MD  cephALEXin (KEFLEX) 500 MG capsule Take 1 capsule (500 mg total) by mouth 4 (four) times daily. Patient not taking: Reported on 05/08/2014 03/22/14   Gerhard Munch, MD  denosumab (PROLIA) 60 MG/ML SOLN injection Inject 60 mg into the skin every 6 (six) months. Administer in upper arm, thigh, or abdomen    Historical Provider, MD  HYDROcodone-acetaminophen (NORCO) 5-325 MG per tablet Take 1 tablet by mouth every 4 (four) hours as needed. 05/08/14   Enid Skeens, MD   BP 153/49 mmHg  Pulse 52  Temp(Src) 97.4 F (36.3 C) (Oral)  Resp 16  SpO2 100% Physical Exam  Constitutional: She is oriented to person, place, and time. She appears well-developed and well-nourished.  HENT:  Head: Normocephalic and atraumatic.  Eyes: Conjunctivae are normal. Right eye exhibits no discharge. Left eye exhibits no discharge.  Neck: Normal range of motion. Neck supple. No tracheal deviation present.  Cardiovascular: Normal rate and regular rhythm.   Pulmonary/Chest: Effort normal and breath sounds normal.  Abdominal: Soft. She exhibits no distension. There is tenderness. There is no guarding.  Patient has mild right mid abdominal tenderness and mild to moderate tenderness right lower flank at site of fistula with thick brown fluid draining with pressure with mild blood. Mild erythema surrounding. Bandages changed.  Musculoskeletal: She exhibits no edema.  Neurological: She is alert and oriented to person, place, and time.  Skin: Skin is warm. No rash noted.  Psychiatric: She has a normal mood and affect.  Nursing note and vitals reviewed.   ED Course  Procedures (including critical care time) Labs Review Labs Reviewed  BASIC METABOLIC  PANEL - Abnormal; Notable for the following:    Glucose, Bld 132 (*)    GFR calc non Af Amer 85 (*)    All other components within normal limits  CBC WITH DIFFERENTIAL - Abnormal; Notable for the following:    Hemoglobin 11.3 (*)    HCT 34.7 (*)    All other components within normal limits  WOUND CULTURE    Imaging Review Ct Abdomen Pelvis W Contrast  05/08/2014   CLINICAL DATA:  Right-sided flank pain  EXAM: CT ABDOMEN AND PELVIS WITH CONTRAST  TECHNIQUE: Multidetector CT imaging of the abdomen and pelvis was performed using the standard protocol following bolus administration of intravenous contrast.  CONTRAST:  OMNIPAQUE IOHEXOL 300 MG/ML  SOLN  COMPARISON:  03/22/2014  FINDINGS: Lung bases are free of acute infiltrate or sizable effusion.  The liver, gallbladder, spleen, adrenal glands and pancreas are all normal in their CT appearance with the exception of a tiny liver cyst in the right lobe  inferiorly stable from the prior exam.  The kidneys are well visualized bilaterally without renal calculi or obstructive changes.  In the right flank there is again noted a fluid attenuation region which extends from the posterior aspect of the ascending colon to the skin surface in the right flank posteriorly. This has enlarged in size when compared with the prior exam and demonstrates a significant amount of air within the tract particularly in the subcutaneous portion. This likely represents a colocutaneous fistula as it appears to communicate with the proximal ascending colon. Interrogation of this area is recommended via fistulogram. This can be performed on a nonemergent basis.  Mild diverticular change in the sigmoid colon is seen without diverticulitis. The bladder is well distended. The left ovarian cyst is noted which measures 3.8 cm in greatest dimension. No free pelvic fluid is seen. No acute bony abnormality is noted.  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 This appears to communicate directly with the ascending colon along a previous drainage catheter tract. Interrogation of this area is recommended via fistulogram.  No other acute abnormality is noted.   Electronically Signed   By: Alcide Clever M.D.   On: 05/08/2014 15:13     EKG Interpretation None      MDM   Final diagnoses:  Right flank pain  Crohn's disease of small intestine with complication  Crohn's disease with fistula   Patient with Crohn's history and fistula/abscess history presents with concerns for similar. With worsening pain and drainage CT scan ordered for further delineation and look for signs of abscess.  CT scan results reviewed showing enlarging colocutaneous fistula. Patient has had this in the past however she's had increased drainage recently. Paged gastroenterology at [facility], call pending.   Recheck patient, comfortable pain control. Patient requesting to go home, family with patient and will call her gastroenterologist tomorrow. She wishes to not wait further for GI call back. There is no abscess, no white count elevation no fever in ER.  Results and differential diagnosis were discussed with the patient/parent/guardian. Close follow up outpatient was discussed, comfortable with the plan.   Medications  morphine 4 MG/ML injection 4 mg (4 mg Intravenous Given 05/08/14 1244)  sodium chloride 0.9 % bolus 500 mL (0 mLs Intravenous Stopped 05/08/14 1354)  iohexol (OMNIPAQUE) 300 MG/ML solution 50 mL (50 mLs Oral Contrast Given 05/08/14 1337)  iohexol (OMNIPAQUE) 300 MG/ML solution 100 mL (100 mLs Intravenous Contrast Given 05/08/14 1455)    Filed Vitals:   05/08/14 1117 05/08/14 1348  BP: 151/68 153/49  Pulse: 84 52  Temp: 98 F (36.7 C) 97.4 F (36.3 C)  TempSrc: Oral Oral  Resp: 16 16  SpO2: 100% 100%    Final diagnoses:  Right flank pain  Crohn's disease of small intestine with complication  Crohn's disease with fistula         Enid Skeens, MD 05/08/14 [phone]

## 2014-05-08 NOTE — ED Notes (Signed)
Pt presents with cyst on right flank/over right hip, cyst is draining moderate amounts of thick brown fluid, pt c/o nausea.

## 2014-05-08 NOTE — Discharge Instructions (Signed)
Call your gastroenterologist tomorrow morning for appointment and to discuss plan for your fistula.  If you were given medicines take as directed.  If you are on coumadin or contraceptives realize their levels and effectiveness is altered by many different medicines.  If you have any reaction (rash, tongues swelling, other) to the medicines stop taking and see a physician.  For severe pain take norco or vicodin however realize they have the potential for addiction and it can make you sleepy and has tylenol in it.  No operating machinery while taking.   Please follow up as directed and return to the ER or see a physician for new or worsening symptoms.  Thank you. Filed Vitals:   05/08/14 1117 05/08/14 1348  BP: 151/68 153/49  Pulse: 84 52  Temp: 98 F (36.7 C) 97.4 F (36.3 C)  TempSrc: Oral Oral  Resp: 16 16  SpO2: 100% 100%

## 2014-05-11 LAB — WOUND CULTURE

## 2014-05-12 NOTE — Progress Notes (Cosign Needed)
  ED Antimicrobial Stewardship Positive Culture Follow Up   Michele David is an 69 y.o. female who presented to [facility] on 05/08/2014 with a chief complaint of  Chief Complaint  Patient presents with  . Cyst    Recent Results (from the past 720 hour(s))  Wound culture     Status: None   Collection Time: 05/08/14  1:54 PM  Result Value Ref Range Status   Specimen Description WOUND FISSURE,FISTULA  Final   Special Requests NONE  Final   Gram Stain   Final    MODERATE WBC PRESENT, PREDOMINANTLY PMN NO SQUAMOUS EPITHELIAL CELLS SEEN ABUNDANT GRAM POSITIVE RODS ABUNDANT GRAM NEGATIVE RODS FEW GRAM POSITIVE COCCI    Culture   Final    ABUNDANT KLEBSIELLA PNEUMONIAE Performed at [facility]    Report Status 05/11/2014 FINAL  Final   Organism ID, Bacteria KLEBSIELLA PNEUMONIAE  Final      Susceptibility   Klebsiella pneumoniae - MIC*    AMPICILLIN RESISTANT      AMPICILLIN/SULBACTAM 4 SENSITIVE Sensitive     CEFAZOLIN <=4 SENSITIVE Sensitive     CEFEPIME <=1 SENSITIVE Sensitive     CEFTAZIDIME <=1 SENSITIVE Sensitive     CEFTRIAXONE <=1 SENSITIVE Sensitive     CIPROFLOXACIN <=0.25 SENSITIVE Sensitive     GENTAMICIN <=1 SENSITIVE Sensitive     IMIPENEM <=0.25 SENSITIVE Sensitive     PIP/TAZO <=4 SENSITIVE Sensitive     TOBRAMYCIN <=1 SENSITIVE Sensitive     TRIMETH/SULFA <=20 SENSITIVE Sensitive     * ABUNDANT KLEBSIELLA PNEUMONIAE    [x]  Treated with Flagyl, organism not suspectible to prescribed antimicrobial []  Patient discharged originally without antimicrobial agent and treatment is now indicated  New antibiotic prescription: Cephalexin 500mg  po QID x 10 days  ED Provider: Santiago Glad, PA-C   Christoper Fabian, PharmD, BCPS Clinical pharmacist, pager [phone] 05/12/2014, 10:20 AM

## 2014-05-13 ENCOUNTER — Telehealth (HOSPITAL_BASED_OUTPATIENT_CLINIC_OR_DEPARTMENT_OTHER): Payer: Self-pay | Admitting: Emergency Medicine

## 2014-05-17 ENCOUNTER — Telehealth (HOSPITAL_BASED_OUTPATIENT_CLINIC_OR_DEPARTMENT_OTHER): Payer: Self-pay | Admitting: Emergency Medicine

## 2015-03-18 ENCOUNTER — Other Ambulatory Visit: Payer: Self-pay

## 2015-03-18 DIAGNOSIS — Z1231 Encounter for screening mammogram for malignant neoplasm of breast: Secondary | ICD-10-CM

## 2015-04-02 ENCOUNTER — Ambulatory Visit
Admission: RE | Admit: 2015-04-02 | Discharge: 2015-04-02 | Disposition: A | Discharge status: Home / Self Care | Payer: Medicare Other | Source: Ambulatory Visit

## 2015-04-02 DIAGNOSIS — Z1231 Encounter for screening mammogram for malignant neoplasm of breast: Secondary | ICD-10-CM

## 2017-04-14 ENCOUNTER — Other Ambulatory Visit: Payer: Self-pay | Admitting: Internal Medicine

## 2017-04-14 DIAGNOSIS — Z1231 Encounter for screening mammogram for malignant neoplasm of breast: Secondary | ICD-10-CM

## 2017-05-10 ENCOUNTER — Ambulatory Visit
Admission: RE | Admit: 2017-05-10 | Discharge: 2017-05-10 | Disposition: A | Discharge status: Home / Self Care | Payer: Medicare Other | Source: Ambulatory Visit | Attending: Internal Medicine | Admitting: Internal Medicine

## 2017-05-10 DIAGNOSIS — Z1231 Encounter for screening mammogram for malignant neoplasm of breast: Secondary | ICD-10-CM

## 2017-11-24 ENCOUNTER — Other Ambulatory Visit: Payer: Self-pay | Admitting: Internal Medicine

## 2017-11-24 DIAGNOSIS — M810 Age-related osteoporosis without current pathological fracture: Secondary | ICD-10-CM

## 2017-11-24 DIAGNOSIS — M8580 Other specified disorders of bone density and structure, unspecified site: Secondary | ICD-10-CM

## 2018-01-11 ENCOUNTER — Ambulatory Visit
Admission: RE | Admit: 2018-01-11 | Discharge: 2018-01-11 | Disposition: A | Discharge status: Home / Self Care | Payer: Medicare Other | Source: Ambulatory Visit | Attending: Internal Medicine | Admitting: Internal Medicine

## 2018-01-11 DIAGNOSIS — M810 Age-related osteoporosis without current pathological fracture: Secondary | ICD-10-CM

## 2018-01-11 DIAGNOSIS — M8580 Other specified disorders of bone density and structure, unspecified site: Secondary | ICD-10-CM

## 2019-04-07 ENCOUNTER — Other Ambulatory Visit: Payer: Self-pay | Admitting: Cardiology

## 2019-04-07 DIAGNOSIS — Z20822 Contact with and (suspected) exposure to covid-19: Secondary | ICD-10-CM

## 2019-04-08 LAB — NOVEL CORONAVIRUS, NAA: SARS-CoV-2, NAA: NOT DETECTED

## 2019-08-01 ENCOUNTER — Other Ambulatory Visit: Payer: Self-pay | Admitting: Internal Medicine

## 2019-08-01 DIAGNOSIS — Z1231 Encounter for screening mammogram for malignant neoplasm of breast: Secondary | ICD-10-CM

## 2019-08-08 ENCOUNTER — Other Ambulatory Visit: Payer: Self-pay

## 2019-08-08 ENCOUNTER — Ambulatory Visit
Admission: RE | Admit: 2019-08-08 | Discharge: 2019-08-08 | Disposition: A | Discharge status: Home / Self Care | Payer: Medicare Other | Source: Ambulatory Visit | Attending: Internal Medicine | Admitting: Internal Medicine

## 2019-08-08 DIAGNOSIS — Z1231 Encounter for screening mammogram for malignant neoplasm of breast: Secondary | ICD-10-CM

## 2020-07-25 ENCOUNTER — Other Ambulatory Visit: Payer: Self-pay | Admitting: Internal Medicine

## 2020-07-25 DIAGNOSIS — M810 Age-related osteoporosis without current pathological fracture: Secondary | ICD-10-CM

## 2020-07-25 DIAGNOSIS — Z1231 Encounter for screening mammogram for malignant neoplasm of breast: Secondary | ICD-10-CM

## 2021-01-08 ENCOUNTER — Ambulatory Visit
Admission: RE | Admit: 2021-01-08 | Discharge: 2021-01-08 | Disposition: A | Discharge status: Home / Self Care | Payer: Medicare Other | Source: Ambulatory Visit | Attending: Internal Medicine | Admitting: Internal Medicine

## 2021-01-08 ENCOUNTER — Other Ambulatory Visit: Payer: Self-pay

## 2021-01-08 DIAGNOSIS — M810 Age-related osteoporosis without current pathological fracture: Secondary | ICD-10-CM

## 2021-01-08 DIAGNOSIS — Z1231 Encounter for screening mammogram for malignant neoplasm of breast: Secondary | ICD-10-CM

## 2021-12-25 ENCOUNTER — Other Ambulatory Visit: Payer: Self-pay | Admitting: Internal Medicine

## 2021-12-25 DIAGNOSIS — Z1231 Encounter for screening mammogram for malignant neoplasm of breast: Secondary | ICD-10-CM

## 2022-01-21 ENCOUNTER — Ambulatory Visit: Payer: Medicare Other

## 2022-02-17 ENCOUNTER — Ambulatory Visit
Admission: RE | Admit: 2022-02-17 | Discharge: 2022-02-17 | Disposition: A | Discharge status: Home / Self Care | Payer: Medicare Other | Source: Ambulatory Visit | Attending: Internal Medicine | Admitting: Internal Medicine

## 2022-02-17 DIAGNOSIS — Z1231 Encounter for screening mammogram for malignant neoplasm of breast: Secondary | ICD-10-CM

## 2023-02-18 ENCOUNTER — Other Ambulatory Visit: Payer: Self-pay | Admitting: Internal Medicine

## 2023-02-18 DIAGNOSIS — Z1231 Encounter for screening mammogram for malignant neoplasm of breast: Secondary | ICD-10-CM

## 2023-03-16 ENCOUNTER — Ambulatory Visit
Admission: RE | Admit: 2023-03-16 | Discharge: 2023-03-16 | Disposition: A | Discharge status: Home / Self Care | Payer: Medicare Other | Source: Ambulatory Visit | Attending: Internal Medicine | Admitting: Internal Medicine

## 2023-03-16 DIAGNOSIS — Z1231 Encounter for screening mammogram for malignant neoplasm of breast: Secondary | ICD-10-CM

## 2023-05-05 ENCOUNTER — Other Ambulatory Visit: Payer: Self-pay | Admitting: Internal Medicine

## 2023-05-05 DIAGNOSIS — M818 Other osteoporosis without current pathological fracture: Secondary | ICD-10-CM

## 2023-05-20 ENCOUNTER — Ambulatory Visit
Admission: RE | Admit: 2023-05-20 | Discharge: 2023-05-20 | Disposition: A | Discharge status: Home / Self Care | Payer: Medicare Other | Source: Ambulatory Visit | Attending: Internal Medicine | Admitting: Internal Medicine

## 2023-05-20 DIAGNOSIS — M818 Other osteoporosis without current pathological fracture: Secondary | ICD-10-CM

## 2024-02-29 ENCOUNTER — Other Ambulatory Visit: Payer: Self-pay | Admitting: Internal Medicine

## 2024-02-29 DIAGNOSIS — Z1231 Encounter for screening mammogram for malignant neoplasm of breast: Secondary | ICD-10-CM

## 2024-03-24 ENCOUNTER — Ambulatory Visit

## 2024-04-14 ENCOUNTER — Ambulatory Visit
Admission: RE | Admit: 2024-04-14 | Discharge: 2024-04-14 | Disposition: A | Discharge status: Home / Self Care | Source: Ambulatory Visit | Attending: Internal Medicine | Admitting: Internal Medicine

## 2024-04-14 DIAGNOSIS — Z1231 Encounter for screening mammogram for malignant neoplasm of breast: Secondary | ICD-10-CM
